# Patient Record
Sex: Male | Born: 1935
Health system: Southern US, Community
[De-identification: ages and names within clinical notes are randomized; demographics above are authoritative.]

## PROBLEM LIST (undated history)

## (undated) DIAGNOSIS — B029 Zoster without complications: Secondary | ICD-10-CM

## (undated) DIAGNOSIS — I714 Abdominal aortic aneurysm, without rupture, unspecified: Secondary | ICD-10-CM

## (undated) DIAGNOSIS — I10 Essential (primary) hypertension: Secondary | ICD-10-CM

## (undated) DIAGNOSIS — S46219A Strain of muscle, fascia and tendon of other parts of biceps, unspecified arm, initial encounter: Secondary | ICD-10-CM

## (undated) DIAGNOSIS — M7020 Olecranon bursitis, unspecified elbow: Secondary | ICD-10-CM

## (undated) DIAGNOSIS — M1710 Unilateral primary osteoarthritis, unspecified knee: Secondary | ICD-10-CM

## (undated) DIAGNOSIS — H01119 Allergic dermatitis of unspecified eye, unspecified eyelid: Secondary | ICD-10-CM

## (undated) DIAGNOSIS — M179 Osteoarthritis of knee, unspecified: Secondary | ICD-10-CM

## (undated) DIAGNOSIS — H01009 Unspecified blepharitis unspecified eye, unspecified eyelid: Secondary | ICD-10-CM

## (undated) DIAGNOSIS — G47419 Narcolepsy without cataplexy: Secondary | ICD-10-CM

## (undated) DIAGNOSIS — C801 Malignant (primary) neoplasm, unspecified: Secondary | ICD-10-CM

## (undated) DIAGNOSIS — N200 Calculus of kidney: Secondary | ICD-10-CM

## (undated) DIAGNOSIS — E785 Hyperlipidemia, unspecified: Secondary | ICD-10-CM

## (undated) DIAGNOSIS — H269 Unspecified cataract: Secondary | ICD-10-CM

## (undated) DIAGNOSIS — K602 Anal fissure, unspecified: Secondary | ICD-10-CM

## (undated) DIAGNOSIS — N210 Calculus in bladder: Secondary | ICD-10-CM

## (undated) DIAGNOSIS — E119 Type 2 diabetes mellitus without complications: Secondary | ICD-10-CM

## (undated) HISTORY — DX: Olecranon bursitis, unspecified elbow: M70.20

## (undated) HISTORY — DX: Abdominal aortic aneurysm, without rupture: I71.4

## (undated) HISTORY — DX: Calculus of kidney: N20.0

## (undated) HISTORY — DX: Unspecified blepharitis unspecified eye, unspecified eyelid: H01.009

## (undated) HISTORY — DX: Abdominal aortic aneurysm, without rupture, unspecified: I71.40

## (undated) HISTORY — DX: Anal fissure, unspecified: K60.2

## (undated) HISTORY — DX: Unspecified cataract: H26.9

## (undated) HISTORY — DX: Hyperlipidemia, unspecified: E78.5

## (undated) HISTORY — DX: Essential (primary) hypertension: I10

## (undated) HISTORY — DX: Calculus in bladder: N21.0

## (undated) HISTORY — DX: Osteoarthritis of knee, unspecified: M17.9

## (undated) HISTORY — PX: HERNIA REPAIR: SHX51

## (undated) HISTORY — DX: Allergic dermatitis of unspecified eye, unspecified eyelid: H01.119

## (undated) HISTORY — PX: FACIAL COSMETIC SURGERY: SHX629

## (undated) HISTORY — DX: Type 2 diabetes mellitus without complications: E11.9

## (undated) HISTORY — DX: Unilateral primary osteoarthritis, unspecified knee: M17.10

## (undated) HISTORY — DX: Strain of muscle, fascia and tendon of other parts of biceps, unspecified arm, initial encounter: S46.219A

## (undated) HISTORY — DX: Zoster without complications: B02.9

## (undated) HISTORY — DX: Malignant (primary) neoplasm, unspecified: C80.1

## (undated) HISTORY — DX: Narcolepsy without cataplexy: G47.419

---

## 1997-12-25 ENCOUNTER — Inpatient Hospital Stay (HOSPITAL_COMMUNITY): Admission: RE | Admit: 1997-12-25 | Discharge: 1997-12-26 | Payer: Self-pay | Admitting: Urology

## 2000-05-05 ENCOUNTER — Other Ambulatory Visit: Admission: RE | Admit: 2000-05-05 | Discharge: 2000-05-05 | Payer: Self-pay | Admitting: Urology

## 2000-05-05 ENCOUNTER — Encounter (INDEPENDENT_AMBULATORY_CARE_PROVIDER_SITE_OTHER): Payer: Self-pay | Admitting: Specialist

## 2000-06-03 ENCOUNTER — Encounter: Admission: RE | Admit: 2000-06-03 | Discharge: 2000-09-01 | Payer: Self-pay | Admitting: Radiation Oncology

## 2007-08-19 ENCOUNTER — Encounter: Admission: RE | Admit: 2007-08-19 | Discharge: 2007-08-19 | Payer: Self-pay | Admitting: Orthopedic Surgery

## 2007-08-26 ENCOUNTER — Encounter: Admission: RE | Admit: 2007-08-26 | Discharge: 2007-09-15 | Payer: Self-pay | Admitting: Orthopedic Surgery

## 2007-08-31 ENCOUNTER — Ambulatory Visit: Payer: Self-pay | Admitting: Vascular Surgery

## 2007-09-28 ENCOUNTER — Ambulatory Visit: Payer: Self-pay | Admitting: Vascular Surgery

## 2008-09-19 ENCOUNTER — Ambulatory Visit: Payer: Self-pay | Admitting: Vascular Surgery

## 2009-06-01 DIAGNOSIS — B029 Zoster without complications: Secondary | ICD-10-CM

## 2009-06-01 HISTORY — DX: Zoster without complications: B02.9

## 2009-09-18 ENCOUNTER — Ambulatory Visit: Payer: Self-pay | Admitting: Vascular Surgery

## 2010-04-18 ENCOUNTER — Encounter: Admission: RE | Admit: 2010-04-18 | Discharge: 2010-04-18 | Payer: Self-pay | Admitting: Internal Medicine

## 2010-09-04 ENCOUNTER — Ambulatory Visit: Payer: Self-pay | Admitting: Vascular Surgery

## 2010-10-14 NOTE — Assessment & Plan Note (Signed)
OFFICE VISIT   Omar Gomez, Omar Gomez  DOB:  April 06, 1925                                       09/19/2008  [id number]#:[id number]   Omar Gomez returns for followup today for bilateral lower extremity  arterial occlusive disease.  He was last seen 1 year ago.  He states  that currently he develops pain in his knees in the medial aspect of his  leg around the knee at approximately 1-2 blocks.  He denies any calf  pain.  He has no problems with pain while riding a bike.  His pain only  occurs with walking.  He has minimal pain when at rest.  He denies any  pain in his feet.  He states that some days the pain is worse than  others and some days it is better than others.   His primary atherosclerotic risk factors include age, elevated  cholesterol, borderline diabetes and hypertension.   MEDICATIONS:  Atenolol 50 mg once a day, Zetia 10 mg once a day,  amlodipine 5 mg once a day, ibuprofen 800 mg once a day, Lipitor 80 mg  once a day, hydrochlorothiazide 25 mg once a day, aspirin 81 mg once a  day.   PHYSICAL EXAMINATION:  VITAL SIGNS:  Blood pressure 148/76 in the right  arm, pulse 52 and regular.  HEENT:  Unremarkable.  NECK:  2+ carotid pulses without bruit.  CHEST:  Clear to auscultation.  CARDIAC:  Regular rate and rhythm without murmur.  ABDOMEN:  Soft, nontender, nondistended with no masses.  EXTREMITIES:  He has 2+ femoral pulses bilaterally.  He has a 1+ right  dorsalis pedis pulse and absent left dorsalis pedis pulse.  He has  absent posterior tibial pulses bilaterally.  Feet are pink, warm and  well-perfused.  On standing, he has bilateral valgus deformity of both  knees.   He had bilateral ABIs performed today which were 0.92 on the right, 0.73  on the left.  These are essentially unchanged from 1 year ago.   I discussed with Omar Gomez today that he is not at risk of limb loss  and has reasonable lower extremity circulation bilaterally.  Most of his  pain again sounds more like degenerative disease and pain related to his  knees rather than claudication.  I did discuss with him the possibility  of an intervention for his pain.  However, he states that this is more  of a nuisance for right now and he is not really interested in any  intervention at this point.  I believe the best option for him is  continued walking, hopefully 30 minutes a day.  He will continue his  aspirin.  He will also continue risk factor modification with treatment  of his hypertension and elevated cholesterol.  He will follow up  with  repeat ABIs in 1 year.   Janetta Hora. Fields, MD  Electronically Signed   CEF/MEDQ  D:  09/19/2008  T:  09/20/2008  Job:  [id number]   cc:   Marlowe Kays, M.D.  Theressa Millard, M.D.

## 2010-10-14 NOTE — Assessment & Plan Note (Signed)
OFFICE VISIT   MAXX, PHAM  DOB:  07-11-1924                                       09/28/2007  [id number]#:[id number]   The patient is an 77 year old male referred for bilateral leg pain.  He  states that he has been having trouble with his knees giving way  secondary to arthritis.  He does not describe classic claudication  symptoms.  He has no calf pain.  He has no rest pain.  He has no  numbness or tingling in his feet.  He denies any tobacco use recently.  He quit smoking 25 years ago.  His atherosclerotic risk factors include  hypertension, elevated cholesterol, and borderline diabetes.  He has no  history of coronary artery disease.  He currently is limited by his  knees giving way during walking.  However, he is able to walk for  approximately 15 minutes a day and he sometimes rides a stationary bike   His past surgical history is remarkable for right inguinal hernia  repairs.  His past medical history, he also had prostate cancer treated  with radiation.  He also has a history of an anal fissure secondary to  this, which has some occasional bleeding.   Medications include atenolol, Lipitor, ibuprofen, Zetia, and a baby  aspirin every other day.  He has no known drug allergies.   Family history is unremarkable.   SOCIAL HISTORY:  He is married.  He has two children.  Smoking history  is as above.  He does not consume alcohol regularly.   On review of systems, he has had no recent weight loss or gain.  He is 5  feet 7, 190 pounds.  He denies any history of chest tightness or shortness of breath.  He has no history of asthma, wheezing, or peptic ulcer disease.  He has some urinary frequency.  He denies history TIA, stroke, syncopal episodes, or seizures.  He has multiple joint arthritis pain and muscle pain.  ENT:  He has decreased visual acuity and wears glasses.  He also has  some decreased hearing acuity.   On physical exam, blood pressure  is 128/69 in the left arm, heart rate  is 62 and regular.  HEENT is unremarkable.  He has no carotid bruits.  Chest is clear to auscultation.  Cardiac exam is regular rate rhythm  without murmur.  Abdomen is soft, nontender, nondistended.  No masses.  Extremities:  He has 2+ radial pulses bilaterally.  He has 2+ femoral  and popliteal pulses bilaterally.  He has absent pedal pulses in the  left foot.  He has a 2+ dorsalis pedis pulse and a 1+ posterior tibial  pulse in the right foot.  Both feet are pink, warm, and adequately  perfused and there are no ulcerations on the feet.   He had bilateral ABIs performed at [facility] on March 20th.  These were 0.96 on the right and 0.70 on the left.  He did have some  evidence of tibial occlusive disease.   In summary, the patient does have some evidence of tibial occlusive  disease bilaterally.  However, he is not experiencing claudication  symptoms.  He currently does not have perfusion that is significant  enough to be considered limb-threatening.  I would also not consider his  symptoms to be disabling.  His symptoms primarily  seem to be related to  his orthopedic or to his joint problems.  I believe he could be managed  conservatively.  This would include continued risk factor modification  and 30 minutes of walking daily.  He will follow up with me for repeat  ABIs in one year's time or sooner if he has development of classic  claudication symptoms.   Janetta Hora. Fields, MD  Electronically Signed   CEF/MEDQ  D:  09/28/2007  T:  09/29/2007  Job:  [id number]   cc:   Marlowe Kays, M.D.  Theressa Millard, M.D.

## 2013-06-19 ENCOUNTER — Other Ambulatory Visit: Payer: Self-pay | Admitting: Internal Medicine

## 2013-06-19 DIAGNOSIS — I714 Abdominal aortic aneurysm, without rupture, unspecified: Secondary | ICD-10-CM

## 2013-09-20 ENCOUNTER — Ambulatory Visit
Admission: RE | Admit: 2013-09-20 | Discharge: 2013-09-20 | Disposition: A | Discharge status: Home / Self Care | Payer: Medicare Other | Source: Ambulatory Visit | Attending: Internal Medicine | Admitting: Internal Medicine

## 2013-09-20 DIAGNOSIS — I714 Abdominal aortic aneurysm, without rupture, unspecified: Secondary | ICD-10-CM

## 2013-09-27 ENCOUNTER — Encounter: Payer: Self-pay | Admitting: Vascular Surgery

## 2013-10-11 ENCOUNTER — Encounter: Payer: Self-pay | Admitting: Vascular Surgery

## 2013-10-12 ENCOUNTER — Encounter: Payer: Self-pay | Admitting: Vascular Surgery

## 2013-10-12 ENCOUNTER — Ambulatory Visit (INDEPENDENT_AMBULATORY_CARE_PROVIDER_SITE_OTHER): Payer: Medicare Other | Admitting: Vascular Surgery

## 2013-10-12 VITALS — BP 140/72 | HR 62 | Resp 18 | Ht 65.0 in | Wt 197.7 lb

## 2013-10-12 DIAGNOSIS — I714 Abdominal aortic aneurysm, without rupture, unspecified: Secondary | ICD-10-CM

## 2013-10-12 DIAGNOSIS — Z01810 Encounter for preprocedural cardiovascular examination: Secondary | ICD-10-CM

## 2013-10-12 NOTE — Progress Notes (Signed)
[facility] [place] HISTORY AND PHYSICAL   CC:  Enlarging AAA  Omar Gomez,*  HPI: This is a 80 y.o. male who has a known AAA that was measured 4.43 cm a year ago.  He had a repeat aortic duplex last month that measured 4.9 cm.  He states that he is not having any abdominal pain.  He states that he has an occasional strain pain in his left groin, but contributes this to strain.  He does have arthritic back pain that he has had for a long time.  He states that he does have pain in his legs and walks for a short distance to the mailbox from the front porch.  He was seen by Dr. Oneida Alar in 2010 and had ABI's at that time that were 0.92 on the right and 0.73 on the left.  He states that he is told he is borderline diabetic and is not on any medications for this.  He is on a statin for his cholesterol.  He is on a beta blocker for his HTN.  He has remote tobacco hx and quit in the 1960's.  He denies any hx of CVA or symptoms and denies MI or any hx of chest pain or SOB.   Past Medical History  Diagnosis Date  . AAA (abdominal aortic aneurysm)   . Diabetes mellitus without complication   . Hypertension   . Hyperlipidemia   . Cancer     PROSTATE CANCER TREATED WITH RADIATION  . Shingles 2011  . Anal fissure   . Kidney stones    Past Surgical History  Procedure Laterality Date  . Hernia repair      1980    No Known Allergies  Current Outpatient Prescriptions  Medication Sig Dispense Refill  . acetaminophen (TYLENOL) 500 MG tablet Take 500 mg by mouth every 6 (six) hours as needed. Take 2 tablets every 6 hours.      Marland Kitchen amLODipine (NORVASC) 5 MG tablet Take 5 mg by mouth daily.      Marland Kitchen aspirin 81 MG tablet Take 81 mg by mouth daily.      Marland Kitchen atenolol (TENORMIN) 50 MG tablet Take 50 mg by mouth daily. Takes 1/2 tablet daily.      Marland Kitchen atorvastatin (LIPITOR) 80 MG tablet Take 80 mg by mouth daily.      Marland Kitchen diltiazem 2 % GEL Apply 1 application topically 3 (three)  times daily.      Marland Kitchen ezetimibe (ZETIA) 10 MG tablet Take 10 mg by mouth daily.      . hydrochlorothiazide (HYDRODIURIL) 25 MG tablet Take 25 mg by mouth daily.       No current facility-administered medications for this visit.    History reviewed. No pertinent family history.  History   Social History  . Marital Status: Married    Spouse Name: N/A    Number of Children: N/A  . Years of Education: N/A   Occupational History  . Not on file.   Social History Main Topics  . Smoking status: Former Smoker    Quit date: 06/02/1963  . Smokeless tobacco: Not on file  . Alcohol Use: No  . Drug Use: No  . Sexual Activity: Not on file   Other Topics Concern  . Not on file   Social History Narrative  . No narrative on file     ROS: [x]  Positive   [ ]  Negative   [ ]  All sytems reviewed and are  negative  Cardiovascular: []  chest pain/pressure []  palpitations []  SOB lying flat []  DOE [x]  pain in legs while walking []  pain in feet when lying flat []  hx of DVT []  hx of phlebitis [x]  swelling in knees-slightly []  varicose veins  Pulmonary: []  productive cough []  asthma []  wheezing  Neurologic: []  weakness in []  arms []  legs []  numbness in []  arms []  legs [] difficulty speaking or slurred speech []  temporary loss of vision in one eye []  dizziness  Hematologic: []  bleeding problems []  problems with blood clotting easily  GI []  vomiting blood []  blood in stool  GU: []  burning with urination []  blood in urine  Psychiatric: []  hx of major depression  Integumentary: []  rashes []  ulcers  Constitutional: []  fever []  chills   PHYSICAL EXAMINATION:  Filed Vitals:   10/12/13 1313  BP: 140/72  Pulse: 62  Resp: 18   Body mass index is 32.9 kg/(m^2).  General:  WDWN in NAD Gait: Not observed HENT: WNL, normocephalic Eyes: Pupils equal Pulmonary: normal non-labored breathing , without Rales, rhonchi,  wheezing Cardiac: RRR, without  Murmurs, rubs or  gallops; without carotid bruits Abdomen: soft, NT, no masses Skin: without rashes, without ulcers  Vascular Exam/Pulses:  Right Left  Radial 2+ (normal) 2+ (normal)  Ulnar 2+ (normal) Non palpable  Femoral 2+ (normal) 2+ (normal)  Popliteal Unable to palpate due to knee brace Non palpable  DP trace trace  PT Non palpable Non palpable   Extremities: without ischemic changes, without Gangrene , without cellulitis; without open wounds;  Musculoskeletal: no muscle wasting or atrophy  Neurologic: A&O X 3; Appropriate Affect ; SENSATION: normal; MOTOR FUNCTION:  moving all extremities equally. Speech is fluent/normal   Non-Invasive Vascular Imaging:   Aortic duplex from outside facility 09/20/13 measuring 4.43 10/07/12   Duplex 09/20/13:  now measuring 4.9 x 4.8 cm,  compared to prior measurements of 4.2 x 4.2 cm.   Pt meds includes: Statin:  yes Beta Blocker:  yes Aspirin:  no ACEI:  no ARB:  no Other Antiplatelet/Anticoagulant:  no   ASSESSMENT: 80 y.o. male with known AAA that has enlarged over the past year from 4.3 cm 10/07/12 to 4.9 cm 09/20/13.  He is asymptomatic.  PLAN: -will schedule pt for CTA of abdomen and pelvis to better evaluate the size of the AAA.  Will see him back after the CTA to discuss results.   Leontine Locket, PA-C [facility] [phone]  Clinic MD:  Pt seen and examined in conjunction with Dr. Oneida Alar  History and exam details as above. Patient has a benign abdominal exam. He has not had any new abdominal pain. He has chronic back pain. He has 2+ femoral pulses bilaterally. Ultrasound suggests some growth of the aneurysm over the last year. We will obtain a CT angiogram the abdomen and pelvis to see if the ultrasound is correct and increasing size. If the aneurysm is 5.5 cm or greater or rapidly expanding we will consider repair. The patient will followup with me after his CT scan.  Ruta Hinds, MD [facility]  [place] Office: [phone] Pager: [phone]

## 2013-10-16 ENCOUNTER — Other Ambulatory Visit: Payer: Self-pay | Admitting: Vascular Surgery

## 2013-10-16 LAB — CREATININE, SERUM: CREATININE: 1.06 mg/dL (ref 0.50–1.35)

## 2013-10-16 LAB — BUN: BUN: 19 mg/dL (ref 6–23)

## 2013-10-18 ENCOUNTER — Encounter: Payer: Self-pay | Admitting: Family

## 2013-10-19 ENCOUNTER — Encounter: Payer: Self-pay | Admitting: Vascular Surgery

## 2013-10-19 ENCOUNTER — Ambulatory Visit
Admission: RE | Admit: 2013-10-19 | Discharge: 2013-10-19 | Disposition: A | Discharge status: Home / Self Care | Payer: Medicare Other | Source: Ambulatory Visit | Attending: Vascular Surgery | Admitting: Vascular Surgery

## 2013-10-19 ENCOUNTER — Ambulatory Visit (INDEPENDENT_AMBULATORY_CARE_PROVIDER_SITE_OTHER): Payer: Medicare Other | Admitting: Vascular Surgery

## 2013-10-19 VITALS — BP 124/72 | HR 66 | Resp 18 | Ht 65.0 in | Wt 194.4 lb

## 2013-10-19 DIAGNOSIS — I714 Abdominal aortic aneurysm, without rupture, unspecified: Secondary | ICD-10-CM

## 2013-10-19 DIAGNOSIS — Z01810 Encounter for preprocedural cardiovascular examination: Secondary | ICD-10-CM

## 2013-10-19 MED ORDER — IOHEXOL 350 MG/ML SOLN
80.0000 mL | Freq: Once | INTRAVENOUS | Status: AC | PRN
  Administered 2013-10-19: 80 mL via INTRAVENOUS

## 2013-10-19 NOTE — Addendum Note (Signed)
Addended by: MCCHESNEY, MARILYN K on: 10/19/2013 04:12 PM   Modules accepted: Orders  

## 2013-10-19 NOTE — Progress Notes (Signed)
Patient is an 56 her old male who returns for followup today. He recently had a size increase of his abdominal aortic aneurysm on ultrasound. He was sent for CT scan for further definition of the aneurysm. He returns for followup today. Ultrasound in 2011 showed the aneurysm was 4.2 cm in diameter. Recent ultrasound had suggested 4.8 cm in diameter. He continues to deny any abdominal pain. He has chronic back pain.  Review of systems: He has slightly unstable gait. He has shortness of breath with severe exertion.  Physical exam:  Filed Vitals:   10/19/13 1237  BP: 124/72  Pulse: 66  Resp: 18  Height: 5\' 5"  (1.651 m)  Weight: 194 lb 6.4 oz (88.179 kg)    Abdomen: Obese soft nontender nondistended  Data: CT angiogram of the abdomen and pelvis is reviewed today. The aneurysm measures 4.4 cm in diameter. I was able to take several measurements an oblique which would put the aneurysm is large as 5 cm but certainly no larger than this. The left common iliac artery was ectatic at 2 cm.  Assessment: Slowly growing abdominal aortic aneurysm asymptomatic less than 5 cm diameter. I discussed with the patient and his wife today that her risk of rupture of aneurysm less than 5-1/2 cm in diameter is less than 0.5% per year  Plan: The patient will have a repeat ultrasound in 6 months time and further review. He will return to the emergency room if he develops severe abdominal or back pain.  Ruta Hinds, MD [facility] [place] Office: [phone] Pager: [phone]   Extremities: 2+ femoral pulses bilaterally

## 2014-03-24 ENCOUNTER — Encounter: Payer: Self-pay | Admitting: *Deleted

## 2014-04-18 ENCOUNTER — Encounter: Payer: Self-pay | Admitting: Vascular Surgery

## 2014-04-19 ENCOUNTER — Ambulatory Visit (HOSPITAL_COMMUNITY)
Admission: RE | Admit: 2014-04-19 | Discharge: 2014-04-19 | Disposition: A | Discharge status: Home / Self Care | Payer: Medicare Other | Source: Ambulatory Visit | Attending: Vascular Surgery | Admitting: Vascular Surgery

## 2014-04-19 ENCOUNTER — Encounter: Payer: Self-pay | Admitting: Vascular Surgery

## 2014-04-19 ENCOUNTER — Ambulatory Visit (INDEPENDENT_AMBULATORY_CARE_PROVIDER_SITE_OTHER): Payer: Medicare Other | Admitting: Vascular Surgery

## 2014-04-19 VITALS — BP 124/59 | HR 50 | Temp 97.7°F | Resp 14 | Ht 65.0 in | Wt 198.0 lb

## 2014-04-19 DIAGNOSIS — I714 Abdominal aortic aneurysm, without rupture, unspecified: Secondary | ICD-10-CM

## 2014-04-19 NOTE — Progress Notes (Signed)
Patient is an 80 year old male who returns for followup today for abdominal aortic aneurysm.  Ultrasound in 2011 showed the aneurysm was 4.2 cm in diameter. He continues to deny any abdominal pain. He has chronic back pain.  Review of systems: He has slightly unstable gait. He has shortness of breath with severe exertion.  Physical exam:    Filed Vitals:   04/19/14 0944  BP: 124/59  Pulse: 50  Temp: 97.7 F (36.5 C)  TempSrc: Oral  Resp: 14  Height: 5\' 5"  (1.651 m)  Weight: 198 lb (89.812 kg)  SpO2: 99%    Abdomen: Obese soft nontender nondistended Extremities: 2+ femoral pulses bilaterally, absent popliteal and pedal pulses  Data: Ultrasound of the abdominal aorta was performed today. The aneurysm measures 4.4 cm in diameter.  Assessment: Slowly growing abdominal aortic aneurysm asymptomatic less than 5 cm diameter. I discussed with the patient and his wife today that her risk of rupture of aneurysm less than 5-1/2 cm in diameter is less than 0.5% per year  Plan: The patient will have a repeat ultrasound in 6 months time and further review. He will return to the emergency room if he develops severe abdominal or back pain.  Ruta Hinds, MD [facility] [place] Office: [phone] Pager: [phone]

## 2014-04-19 NOTE — Addendum Note (Signed)
Addended by: Mena Goes on: 04/19/2014 04:53 PM   Modules accepted: Orders

## 2014-10-16 ENCOUNTER — Encounter: Payer: Self-pay | Admitting: Family

## 2014-10-18 ENCOUNTER — Encounter: Payer: Self-pay | Admitting: Family

## 2014-10-18 ENCOUNTER — Ambulatory Visit (INDEPENDENT_AMBULATORY_CARE_PROVIDER_SITE_OTHER): Payer: Medicare Other | Admitting: Family

## 2014-10-18 ENCOUNTER — Ambulatory Visit (HOSPITAL_COMMUNITY)
Admission: RE | Admit: 2014-10-18 | Discharge: 2014-10-18 | Disposition: A | Discharge status: Home / Self Care | Payer: Medicare Other | Source: Ambulatory Visit | Attending: Vascular Surgery | Admitting: Vascular Surgery

## 2014-10-18 VITALS — BP 139/70 | HR 50 | Resp 14 | Ht 66.0 in | Wt 198.0 lb

## 2014-10-18 DIAGNOSIS — I7789 Other specified disorders of arteries and arterioles: Secondary | ICD-10-CM | POA: Diagnosis not present

## 2014-10-18 DIAGNOSIS — I714 Abdominal aortic aneurysm, without rupture, unspecified: Secondary | ICD-10-CM

## 2014-10-18 DIAGNOSIS — E669 Obesity, unspecified: Secondary | ICD-10-CM | POA: Diagnosis not present

## 2014-10-18 NOTE — Addendum Note (Signed)
Addended by: Dorthula Rue L on: 10/18/2014 11:33 AM   Modules accepted: Orders

## 2014-10-18 NOTE — Progress Notes (Signed)
[facility] [place]  Established Abdominal Aortic Aneurysm  History of Present Illness  Omar Gomez is a 81 y.o. ([birth year]) male patient of Dr. Oneida Alar who returns for followup today for abdominal aortic aneurysm. Ultrasound in 2011 showed the aneurysm was 4.2 cm in diameter. He continues to deny any abdominal pain. He has chronic back pain.  The patient does not seem to have claudication symptoms as he cannot walk much due to significant bilateral knee pain. The patient denies history of stroke or TIA symptoms.  Pt Diabetic: Yes Pt smoker: former smoker, quit in the 1960's  Past Medical History  Diagnosis Date  . AAA (abdominal aortic aneurysm)   . Diabetes mellitus without complication   . Hypertension   . Hyperlipidemia   . Cancer     PROSTATE CANCER TREATED WITH RADIATION  . Shingles 2011  . Anal fissure   . Kidney stones   . Narcolepsy   . OA (osteoarthritis) of knee   . Bladder stone   . Biceps tendon rupture   . Cataract   . Olecranon bursitis   . Dermatitis contact, eyelid   . Blepharitis    Past Surgical History  Procedure Laterality Date  . Hernia repair      1980  . Facial cosmetic surgery      Lesion removed to rule out melanoma   Social History History   Social History  . Marital Status: Married    Spouse Name: N/A  . Number of Children: N/A  . Years of Education: N/A   Occupational History  . Not on file.   Social History Main Topics  . Smoking status: Former Smoker    Quit date: 06/02/1963  . Smokeless tobacco: Not on file  . Alcohol Use: No  . Drug Use: No  . Sexual Activity: Not on file   Other Topics Concern  . Not on file   Social History Narrative   Family History Family History  Problem Relation Age of Onset  . Alcohol abuse Brother   . Heart disease Brother     Current Outpatient Prescriptions on File Prior to Visit  Medication Sig Dispense Refill  . acetaminophen (TYLENOL) 500 MG tablet Take  500 mg by mouth every 6 (six) hours as needed. Take 2 tablets every 6 hours.    Marland Kitchen amLODipine (NORVASC) 5 MG tablet Take 5 mg by mouth daily.    Marland Kitchen aspirin 81 MG tablet Take 81 mg by mouth as needed. Pt takes one tablet every two days.    Marland Kitchen atenolol (TENORMIN) 50 MG tablet Take 50 mg by mouth daily. Takes 1/2 tablet daily.    Marland Kitchen atorvastatin (LIPITOR) 80 MG tablet Take 40 mg by mouth daily.     Marland Kitchen diltiazem 2 % GEL Apply 1 application topically daily.     Marland Kitchen ezetimibe (ZETIA) 10 MG tablet Take 10 mg by mouth daily.    . hydrochlorothiazide (HYDRODIURIL) 25 MG tablet Take 25 mg by mouth daily.     No current facility-administered medications on file prior to visit.   No Known Allergies  ROS: See HPI for pertinent positives and negatives.  Physical Examination  Filed Vitals:   10/18/14 0938  BP: 139/70  Pulse: 50  Resp: 14  Height: 5\' 6"  (1.676 m)  Weight: 198 lb (89.812 kg)  SpO2: 97%   Body mass index is 31.97 kg/(m^2).  General: A&O x 3, WD, obese male.  Pulmonary:Respirations are not labored.  Cardiac: RRR, Nl  S1, S2, no appreciable murmur.   Carotid Bruits Right Left   Negative Negative   Aorta is not palpable Radial pulses are 2+ palpable and =                          VASCULAR EXAM:                                                                                                         LE Pulses Right Left       FEMORAL  not palpable (obese)  not palpable (obese)        POPLITEAL  not palpable   not palpable       POSTERIOR TIBIAL  not palpable   not palpable        DORSALIS PEDIS      ANTERIOR TIBIAL faintly palpable  faintly palpable      Gastrointestinal: soft, NTND, -G/R, - HSM, - masses palpated, - CVAT B.  Musculoskeletal: M/S 4/5 throughout, Extremities without ischemic changes.  Neurologic: CN 2-12 intact except some hearing loss, Pain and light touch intact in extremities are intact, Motor exam as listed above.   Non-Invasive Vascular  Imaging  AAA Duplex (10/18/2014) ABDOMINAL AORTA DUPLEX EVALUATION    INDICATION: Follow-up abdominal aortic aneurysm     PREVIOUS INTERVENTION(S):     DUPLEX EXAM:     LOCATION DIAMETER AP (cm) DIAMETER TRANSVERSE (cm) VELOCITIES (cm/sec)  Aorta Proximal 2.33 2.33 46  Aorta Mid 4.46 4.46 38  Aorta Distal 2.19 2.17 41  Right Common Iliac Artery 1.53  118  Left Common Iliac Artery 1.93  109    Previous max aortic diameter:  4.4cm Date: 04/19/2014  ADDITIONAL FINDINGS:     IMPRESSION: 1. Stable abdominal aortic aneurysm measuring 4.46cm on today's exam. 2. Ectasia is observed in the bilateral common iliac artery.    Compared to the previous exam:       Medical Decision Making  The patient is a 81 y.o. male who presents with asymptomatic AAA with no increase in size.   Based on this patient's exam and diagnostic studies, the patient will follow up in 6 months  with the following studies: AAA Duplex.  Consideration for repair of AAA would be made when the size is 5.5 cm, growth > 1 cm/yr, and symptomatic status.  Dr. Oneida Alar previously discussed with the patient and his wife that his risk of rupture of aneurysm less than 5-1/2 cm in diameter is less than 0.5% per year.  I emphasized the importance of maximal medical management including strict control of blood pressure, blood glucose, and lipid levels, antiplatelet agents, obtaining regular exercise, and continued cessation of smoking.   The patient was given information about AAA including signs, symptoms, treatment, and how to minimize the risk of enlargement and rupture of aneurysms.    The patient was advised to call [phone] should the patient experience sudden onset abdominal or back pain.   Thank you for allowing [place] to participate in this patient's care.  Vinnie Level  Yulia Ulrich, RN, MSN, FNP-C [facility] [place] Office: [phone]  Clinic Physician: Oneida Alar  10/18/2014, 9:46 AM

## 2014-10-18 NOTE — Patient Instructions (Signed)
Abdominal Aortic Aneurysm An aneurysm is a weakened or damaged part of an artery wall that bulges from the normal force of blood pumping through the body. An abdominal aortic aneurysm is an aneurysm that occurs in the lower part of the aorta, the main artery of the body.  The major concern with an abdominal aortic aneurysm is that it can enlarge and burst (rupture) or blood can flow between the layers of the wall of the aorta through a tear (aorticdissection). Both of these conditions can cause bleeding inside the body and can be life threatening unless diagnosed and treated promptly. CAUSES  The exact cause of an abdominal aortic aneurysm is unknown. Some contributing factors are:   A hardening of the arteries caused by the buildup of fat and other substances in the lining of a blood vessel (arteriosclerosis).  Inflammation of the walls of an artery (arteritis).   Connective tissue diseases, such as Marfan syndrome.   Abdominal trauma.   An infection, such as syphilis or staphylococcus, in the wall of the aorta (infectious aortitis) caused by bacteria. RISK FACTORS  Risk factors that contribute to an abdominal aortic aneurysm may include:  Age older than 60 years.   High blood pressure (hypertension).  Male gender.  Ethnicity (white race).  Obesity.  Family history of aneurysm (first degree relatives only).  Tobacco use. PREVENTION  The following healthy lifestyle habits may help decrease your risk of abdominal aortic aneurysm:  Quitting smoking. Smoking can raise your blood pressure and cause arteriosclerosis.  Limiting or avoiding alcohol.  Keeping your blood pressure, blood sugar level, and cholesterol levels within normal limits.  Decreasing your salt intake. In somepeople, too much salt can raise blood pressure and increase your risk of abdominal aortic aneurysm.  Eating a diet low in saturated fats and cholesterol.  Increasing your fiber intake by including  whole grains, vegetables, and fruits in your diet. Eating these foods may help lower blood pressure.  Maintaining a healthy weight.  Staying physically active and exercising regularly. SYMPTOMS  The symptoms of abdominal aortic aneurysm may vary depending on the size and rate of growth of the aneurysm.Most grow slowly and do not have any symptoms. When symptoms do occur, they may include:  Pain (abdomen, side, lower back, or groin). The pain may vary in intensity. A sudden onset of severe pain may indicate that the aneurysm has ruptured.  Feeling full after eating only small amounts of food.  Nausea or vomiting or both.  Feeling a pulsating lump in the abdomen.  Feeling faint or passing out. DIAGNOSIS  Since most unruptured abdominal aortic aneurysms have no symptoms, they are often discovered during diagnostic exams for other conditions. An aneurysm may be found during the following procedures:  Ultrasonography (A one-time screening for abdominal aortic aneurysm by ultrasonography is also recommended for all men aged 65-75 years who have ever smoked).  X-ray exams.  A computed tomography (CT).  Magnetic resonance imaging (MRI).  Angiography or arteriography. TREATMENT  Treatment of an abdominal aortic aneurysm depends on the size of your aneurysm, your age, and risk factors for rupture. Medication to control blood pressure and pain may be used to manage aneurysms smaller than 6 cm. Regular monitoring for enlargement may be recommended by your caregiver if:  The aneurysm is 3-4 cm in size (an annual ultrasonography may be recommended).  The aneurysm is 4-4.5 cm in size (an ultrasonography every 6 months may be recommended).  The aneurysm is larger than 4.5 cm in   size (your caregiver may ask that you be examined by a vascular surgeon). If your aneurysm is larger than 6 cm, surgical repair may be recommended. There are two main methods for repair of an aneurysm:   Endovascular  repair (a minimally invasive surgery). This is done most often.  Open repair. This method is used if an endovascular repair is not possible. Document Released: 02/25/2005 Document Revised: 09/12/2012 Document Reviewed: 06/17/2012 Exit[employer] Patient Information 2015 [employer]. This information is not intended to replace advice given to you by your health care provider. Make sure you discuss any questions you have with your health care provider.  

## 2015-04-30 ENCOUNTER — Encounter: Payer: Self-pay | Admitting: Family

## 2015-05-02 ENCOUNTER — Ambulatory Visit (INDEPENDENT_AMBULATORY_CARE_PROVIDER_SITE_OTHER): Payer: Medicare Other | Admitting: Family

## 2015-05-02 ENCOUNTER — Encounter: Payer: Self-pay | Admitting: Family

## 2015-05-02 ENCOUNTER — Ambulatory Visit (HOSPITAL_COMMUNITY)
Admission: RE | Admit: 2015-05-02 | Discharge: 2015-05-02 | Disposition: A | Discharge status: Home / Self Care | Payer: Medicare Other | Source: Ambulatory Visit | Attending: Family | Admitting: Family

## 2015-05-02 VITALS — BP 119/70 | HR 53 | Temp 97.7°F | Resp 14 | Ht 65.5 in | Wt 202.0 lb

## 2015-05-02 DIAGNOSIS — I10 Essential (primary) hypertension: Secondary | ICD-10-CM | POA: Insufficient documentation

## 2015-05-02 DIAGNOSIS — E669 Obesity, unspecified: Secondary | ICD-10-CM | POA: Diagnosis not present

## 2015-05-02 DIAGNOSIS — E785 Hyperlipidemia, unspecified: Secondary | ICD-10-CM | POA: Insufficient documentation

## 2015-05-02 DIAGNOSIS — I714 Abdominal aortic aneurysm, without rupture, unspecified: Secondary | ICD-10-CM

## 2015-05-02 DIAGNOSIS — E119 Type 2 diabetes mellitus without complications: Secondary | ICD-10-CM | POA: Insufficient documentation

## 2015-05-02 NOTE — Patient Instructions (Signed)
Abdominal Aortic Aneurysm An aneurysm is a weakened or damaged part of an artery wall that bulges from the normal force of blood pumping through the body. An abdominal aortic aneurysm is an aneurysm that occurs in the lower part of the aorta, the main artery of the body.  The major concern with an abdominal aortic aneurysm is that it can enlarge and burst (rupture) or blood can flow between the layers of the wall of the aorta through a tear (aorticdissection). Both of these conditions can cause bleeding inside the body and can be life threatening unless diagnosed and treated promptly. CAUSES  The exact cause of an abdominal aortic aneurysm is unknown. Some contributing factors are:   A hardening of the arteries caused by the buildup of fat and other substances in the lining of a blood vessel (arteriosclerosis).  Inflammation of the walls of an artery (arteritis).   Connective tissue diseases, such as Marfan syndrome.   Abdominal trauma.   An infection, such as syphilis or staphylococcus, in the wall of the aorta (infectious aortitis) caused by bacteria. RISK FACTORS  Risk factors that contribute to an abdominal aortic aneurysm may include:  Age older than 60 years.   High blood pressure (hypertension).  Male gender.  Ethnicity (white race).  Obesity.  Family history of aneurysm (first degree relatives only).  Tobacco use. PREVENTION  The following healthy lifestyle habits may help decrease your risk of abdominal aortic aneurysm:  Quitting smoking. Smoking can raise your blood pressure and cause arteriosclerosis.  Limiting or avoiding alcohol.  Keeping your blood pressure, blood sugar level, and cholesterol levels within normal limits.  Decreasing your salt intake. In somepeople, too much salt can raise blood pressure and increase your risk of abdominal aortic aneurysm.  Eating a diet low in saturated fats and cholesterol.  Increasing your fiber intake by including  whole grains, vegetables, and fruits in your diet. Eating these foods may help lower blood pressure.  Maintaining a healthy weight.  Staying physically active and exercising regularly. SYMPTOMS  The symptoms of abdominal aortic aneurysm may vary depending on the size and rate of growth of the aneurysm.Most grow slowly and do not have any symptoms. When symptoms do occur, they may include:  Pain (abdomen, side, lower back, or groin). The pain may vary in intensity. A sudden onset of severe pain may indicate that the aneurysm has ruptured.  Feeling full after eating only small amounts of food.  Nausea or vomiting or both.  Feeling a pulsating lump in the abdomen.  Feeling faint or passing out. DIAGNOSIS  Since most unruptured abdominal aortic aneurysms have no symptoms, they are often discovered during diagnostic exams for other conditions. An aneurysm may be found during the following procedures:  Ultrasonography (A one-time screening for abdominal aortic aneurysm by ultrasonography is also recommended for all men aged 65-75 years who have ever smoked).  X-ray exams.  A computed tomography (CT).  Magnetic resonance imaging (MRI).  Angiography or arteriography. TREATMENT  Treatment of an abdominal aortic aneurysm depends on the size of your aneurysm, your age, and risk factors for rupture. Medication to control blood pressure and pain may be used to manage aneurysms smaller than 6 cm. Regular monitoring for enlargement may be recommended by your caregiver if:  The aneurysm is 3-4 cm in size (an annual ultrasonography may be recommended).  The aneurysm is 4-4.5 cm in size (an ultrasonography every 6 months may be recommended).  The aneurysm is larger than 4.5 cm in   size (your caregiver may ask that you be examined by a vascular surgeon). If your aneurysm is larger than 6 cm, surgical repair may be recommended. There are two main methods for repair of an aneurysm:   Endovascular  repair (a minimally invasive surgery). This is done most often.  Open repair. This method is used if an endovascular repair is not possible.   This information is not intended to replace advice given to you by your health care provider. Make sure you discuss any questions you have with your health care provider.   Document Released: 02/25/2005 Document Revised: 09/12/2012 Document Reviewed: 06/17/2012 Elsevier Interactive Patient Education 2016 Elsevier Inc.  

## 2015-05-02 NOTE — Progress Notes (Signed)
[facility] [place]  Established Abdominal Aortic Aneurysm  History of Present Illness  Omar Gomez is a 81 y.o. (09-28-1924) male patient of Dr. Oneida Alar who returns for followup today for abdominal aortic aneurysm. Ultrasound in 2011 showed the aneurysm was 4.2 cm in diameter. He continues to deny any abdominal pain. He has chronic back pain, states he was told that he has arthritis in his back and knee.  The patient does not seem to have claudication symptoms as he cannot walk much due to significant bilateral knee pain. The patient denies history of stroke or TIA symptoms.  Pt Diabetic: Yes Pt smoker: former smoker, quit in the 1960's   Past Medical History  Diagnosis Date  . AAA (abdominal aortic aneurysm) ([place])   . Diabetes mellitus without complication ([place])   . Hypertension   . Hyperlipidemia   . Cancer ([place])     PROSTATE CANCER TREATED WITH RADIATION  . Shingles 2011  . Anal fissure   . Kidney stones   . Narcolepsy   . OA (osteoarthritis) of knee   . Bladder stone   . Biceps tendon rupture   . Cataract   . Olecranon bursitis   . Dermatitis contact, eyelid   . Blepharitis    Past Surgical History  Procedure Laterality Date  . Hernia repair      1980  . Facial cosmetic surgery      Lesion removed to rule out melanoma   Social History Social History   Social History  . Marital Status: Married    Spouse Name: N/A  . Number of Children: N/A  . Years of Education: N/A   Occupational History  . Not on file.   Social History Main Topics  . Smoking status: Former Smoker    Quit date: 06/02/1963  . Smokeless tobacco: Never Used  . Alcohol Use: No  . Drug Use: No  . Sexual Activity: Not on file   Other Topics Concern  . Not on file   Social History Narrative   Family History Family History  Problem Relation Age of Onset  . Alcohol abuse Brother   . Heart disease Brother     Current Outpatient Prescriptions on File Prior to  Visit  Medication Sig Dispense Refill  . acetaminophen (TYLENOL) 500 MG tablet Take 500 mg by mouth every 6 (six) hours as needed. Take 2 tablets every 6 hours.    Marland Kitchen amLODipine (NORVASC) 5 MG tablet Take 5 mg by mouth daily.    Marland Kitchen aspirin 81 MG tablet Take 81 mg by mouth as needed. Pt takes one tablet every two days.    Marland Kitchen atenolol (TENORMIN) 50 MG tablet Take 50 mg by mouth daily. Takes 1/2 tablet daily.    Marland Kitchen atorvastatin (LIPITOR) 80 MG tablet Take 40 mg by mouth daily.     Marland Kitchen diltiazem 2 % GEL Apply 1 application topically daily.     Marland Kitchen ezetimibe (ZETIA) 10 MG tablet Take 10 mg by mouth daily.    . hydrochlorothiazide (HYDRODIURIL) 25 MG tablet Take 25 mg by mouth daily.     No current facility-administered medications on file prior to visit.   No Known Allergies  ROS: See HPI for pertinent positives and negatives.  Physical Examination  Filed Vitals:   05/02/15 0936  BP: 119/70  Pulse: 53  Temp: 97.7 F (36.5 C)  TempSrc: Oral  Resp: 14  Height: 5' 5.5" (1.664 m)  Weight: 202 lb (91.627 kg)  SpO2:  95%   Body mass index is 33.09 kg/(m^2).  General: A&O x 3, WD, obese male.  Pulmonary:Respirations are not labored.  Cardiac: RRR, Nl S1, S2, no appreciable murmur.   Carotid Bruits Right Left   Negative Negative  Aorta is not palpable Radial pulses are 2+ palpable and =   VASCULAR EXAM:     LE Pulses Right Left   FEMORAL 2+ palpable  2+ palpable     POPLITEAL not palpable  not palpable   POSTERIOR TIBIAL not palpable  not palpable    DORSALIS PEDIS  ANTERIOR TIBIAL faintly palpable  faintly palpable      Gastrointestinal: soft, NTND, -G/R, - HSM, - masses palpated, - CVAT B.  Musculoskeletal: M/S 4/5 throughout, Extremities without ischemic changes. Feet are  pink and warm.  Neurologic: CN 2-12 intact except some hearing loss, pain and light touch intact in extremities are intact, Motor exam as listed above.         Non-Invasive Vascular Imaging  AAA Duplex (05/02/2015) ABDOMINAL AORTA DUPLEX EVALUATION    INDICATION: Evaluation of abdominal aorta.    PREVIOUS INTERVENTION(S):     DUPLEX EXAM:     LOCATION DIAMETER AP (cm) DIAMETER TRANSVERSE (cm) VELOCITIES (cm/sec)  Aorta Proximal 3.03  40  Aorta Mid 4.41 4.24 47  Aorta Distal 2.11    Right Common Iliac Artery 1.63  101  Left Common Iliac Artery Not Visualized  Not Visualized      Previous max aortic diameter:  4.46 x 4.46 Date: 10/18/2014     ADDITIONAL FINDINGS: Technically difficult exam due to overlying bowel gas.    IMPRESSION: Patent abdominal aortic aneurysm measuring approximately 4.11 x 4.24 cm in diameter.     Compared to the previous exam:  No significant change in comparison to the last exam on 10/18/2014.     Medical Decision Making  The patient is a 81 y.o. male who presents with no symptoms referable to AAA with no increase in size.   Based on this patient's exam and diagnostic studies, the patient will follow up in 1 year  with the following studies: AAA duplex.  Consideration for repair of AAA would be made when the size is 5.5 cm, growth > 1 cm/yr, and symptomatic status.  I emphasized the importance of maximal medical management including strict control of blood pressure, blood glucose, and lipid levels, antiplatelet agents, obtaining regular exercise, and continued  cessation of smoking.   The patient was given information about AAA including signs, symptoms, treatment, and how to minimize the risk of enlargement and rupture of aneurysms.    The patient was advised to call 911 should the patient experience sudden onset abdominal or back pain.   Thank you for allowing [place] to participate in this patient's care.  Clemon Chambers, RN, MSN, FNP-C [facility] [place] Office: [facility] Physician: Oneida Alar  05/02/2015, 9:44 AM

## 2015-07-23 DIAGNOSIS — H02833 Dermatochalasis of right eye, unspecified eyelid: Secondary | ICD-10-CM | POA: Diagnosis not present

## 2015-07-23 DIAGNOSIS — H04213 Epiphora due to excess lacrimation, bilateral lacrimal glands: Secondary | ICD-10-CM | POA: Diagnosis not present

## 2015-07-23 DIAGNOSIS — H16141 Punctate keratitis, right eye: Secondary | ICD-10-CM | POA: Diagnosis not present

## 2015-07-23 DIAGNOSIS — H40013 Open angle with borderline findings, low risk, bilateral: Secondary | ICD-10-CM | POA: Diagnosis not present

## 2015-10-29 DIAGNOSIS — I714 Abdominal aortic aneurysm, without rupture: Secondary | ICD-10-CM | POA: Diagnosis not present

## 2015-10-29 DIAGNOSIS — I10 Essential (primary) hypertension: Secondary | ICD-10-CM | POA: Diagnosis not present

## 2015-10-29 DIAGNOSIS — E7800 Pure hypercholesterolemia, unspecified: Secondary | ICD-10-CM | POA: Diagnosis not present

## 2015-10-29 DIAGNOSIS — C61 Malignant neoplasm of prostate: Secondary | ICD-10-CM | POA: Diagnosis not present

## 2015-10-29 DIAGNOSIS — E1140 Type 2 diabetes mellitus with diabetic neuropathy, unspecified: Secondary | ICD-10-CM | POA: Diagnosis not present

## 2015-10-29 DIAGNOSIS — N183 Chronic kidney disease, stage 3 (moderate): Secondary | ICD-10-CM | POA: Diagnosis not present

## 2015-10-29 DIAGNOSIS — E1142 Type 2 diabetes mellitus with diabetic polyneuropathy: Secondary | ICD-10-CM | POA: Diagnosis not present

## 2015-12-09 DIAGNOSIS — K624 Stenosis of anus and rectum: Secondary | ICD-10-CM | POA: Diagnosis not present

## 2015-12-09 DIAGNOSIS — C61 Malignant neoplasm of prostate: Secondary | ICD-10-CM | POA: Diagnosis not present

## 2015-12-16 DIAGNOSIS — C61 Malignant neoplasm of prostate: Secondary | ICD-10-CM | POA: Diagnosis not present

## 2015-12-16 DIAGNOSIS — K624 Stenosis of anus and rectum: Secondary | ICD-10-CM | POA: Diagnosis not present

## 2016-01-23 DIAGNOSIS — H35033 Hypertensive retinopathy, bilateral: Secondary | ICD-10-CM | POA: Diagnosis not present

## 2016-01-23 DIAGNOSIS — H40013 Open angle with borderline findings, low risk, bilateral: Secondary | ICD-10-CM | POA: Diagnosis not present

## 2016-01-23 DIAGNOSIS — Z8679 Personal history of other diseases of the circulatory system: Secondary | ICD-10-CM | POA: Diagnosis not present

## 2016-01-23 DIAGNOSIS — H35371 Puckering of macula, right eye: Secondary | ICD-10-CM | POA: Diagnosis not present

## 2016-02-24 DIAGNOSIS — K624 Stenosis of anus and rectum: Secondary | ICD-10-CM | POA: Diagnosis not present

## 2016-03-03 DIAGNOSIS — E1142 Type 2 diabetes mellitus with diabetic polyneuropathy: Secondary | ICD-10-CM | POA: Diagnosis not present

## 2016-03-03 DIAGNOSIS — C61 Malignant neoplasm of prostate: Secondary | ICD-10-CM | POA: Diagnosis not present

## 2016-03-03 DIAGNOSIS — E1140 Type 2 diabetes mellitus with diabetic neuropathy, unspecified: Secondary | ICD-10-CM | POA: Diagnosis not present

## 2016-03-03 DIAGNOSIS — E7800 Pure hypercholesterolemia, unspecified: Secondary | ICD-10-CM | POA: Diagnosis not present

## 2016-03-03 DIAGNOSIS — E1122 Type 2 diabetes mellitus with diabetic chronic kidney disease: Secondary | ICD-10-CM | POA: Diagnosis not present

## 2016-03-03 DIAGNOSIS — N183 Chronic kidney disease, stage 3 (moderate): Secondary | ICD-10-CM | POA: Diagnosis not present

## 2016-03-03 DIAGNOSIS — I10 Essential (primary) hypertension: Secondary | ICD-10-CM | POA: Diagnosis not present

## 2016-03-03 DIAGNOSIS — I714 Abdominal aortic aneurysm, without rupture: Secondary | ICD-10-CM | POA: Diagnosis not present

## 2016-03-03 DIAGNOSIS — I739 Peripheral vascular disease, unspecified: Secondary | ICD-10-CM | POA: Diagnosis not present

## 2016-03-03 DIAGNOSIS — Z7984 Long term (current) use of oral hypoglycemic drugs: Secondary | ICD-10-CM | POA: Diagnosis not present

## 2016-03-04 ENCOUNTER — Other Ambulatory Visit: Payer: Self-pay | Admitting: *Deleted

## 2016-03-04 ENCOUNTER — Telehealth (HOSPITAL_COMMUNITY): Payer: Self-pay | Admitting: Vascular Surgery

## 2016-03-04 DIAGNOSIS — I714 Abdominal aortic aneurysm, without rupture, unspecified: Secondary | ICD-10-CM

## 2016-03-04 NOTE — Telephone Encounter (Signed)
-----   Message from Viann Fish, NP sent at 03/04/2016  2:46 PM EDT ----- Regarding: FW: Follow-up appt question from pts wife Contact: [place], Please see Dr. Oneida Alar response, 1 year, Thank you, Vinnie Level  ----- Message ----- From: Elam Dutch, MD Sent: 03/04/2016   1:52 PM To: Sharmon Leyden Nickel, NP Subject: RE: Follow-up appt question from pts wife      1 year is fine ----- Message ----- From: Viann Fish, NP Sent: 03/04/2016  12:16 PM To: Elam Dutch, MD, Rufina Falco Subject: RE: Follow-up appt question from pts wife      Rip Harbour, Looking back at his earliest result on file, in May of 2015 his AAA measured 4.4 cm, the same as his last visit with me on 05/02/15. I suspect Dr. Oneida Alar wanted to see him every 6 months initially to see if the aneurysm would much bigger, but it has remained the same size for 1 1/2 years. But I cc'd your message and my response to Dr. Oneida Alar so that he may advise. Thank you, Vinnie Level  ----- Message ----- From: Rufina Falco Sent: 03/04/2016  11:37 AM To: Sharmon Leyden Nickel, NP Subject: Follow-up appt question from pts wife          When Mr. Breitenbach saw Dr. Oneida Alar 04/19/2014, he recommended 6 month follow-ups. Mr. Ally returned 10/18/2014 saw you and you recommended 6 month follow-up.   Then the last time he was here 12/11/201, saw you again- a yearly u/s was recommended.    The wife would like to confirm which it should be.   She indicated no changes were seen in the ultrasound.  She would like me to call her today to follow up with her.     Thanks! Rip Harbour

## 2016-05-04 ENCOUNTER — Encounter: Payer: Self-pay | Admitting: Family

## 2016-05-07 ENCOUNTER — Ambulatory Visit: Payer: Self-pay | Admitting: Family

## 2016-05-07 ENCOUNTER — Inpatient Hospital Stay (HOSPITAL_COMMUNITY): Admission: RE | Admit: 2016-05-07 | Payer: Self-pay | Source: Ambulatory Visit

## 2016-05-12 ENCOUNTER — Ambulatory Visit (INDEPENDENT_AMBULATORY_CARE_PROVIDER_SITE_OTHER): Payer: Medicare Other | Admitting: Family

## 2016-05-12 ENCOUNTER — Ambulatory Visit (HOSPITAL_COMMUNITY)
Admission: RE | Admit: 2016-05-12 | Discharge: 2016-05-12 | Disposition: A | Discharge status: Home / Self Care | Payer: Medicare Other | Source: Ambulatory Visit | Attending: Vascular Surgery | Admitting: Vascular Surgery

## 2016-05-12 ENCOUNTER — Encounter: Payer: Self-pay | Admitting: Family

## 2016-05-12 VITALS — BP 130/66 | HR 54 | Temp 97.4°F | Resp 18 | Wt 196.0 lb

## 2016-05-12 DIAGNOSIS — I714 Abdominal aortic aneurysm, without rupture, unspecified: Secondary | ICD-10-CM

## 2016-05-12 NOTE — Patient Instructions (Signed)
Abdominal Aortic Aneurysm Blood pumps away from the heart through tubes (blood vessels) called arteries. Aneurysms are weak or damaged places in the wall of an artery. It bulges out like a balloon. An abdominal aortic aneurysm happens in the main artery of the body (aorta). It can burst or tear, causing bleeding inside the body. This is an emergency. It needs treatment right away. What are the causes? The exact cause is unknown. Things that could cause this problem include:  Fat and other substances building up in the lining of a tube.  Swelling of the walls of a blood vessel.  Certain tissue diseases.  Belly (abdominal) trauma.  An infection in the main artery of the body.  What increases the risk? There are things that make it more likely for you to have an aneurysm. These include:  Being over the age of 80 years old.  Having high blood pressure (hypertension).  Being a male.  Being white.  Being very overweight (obese).  Having a family history of aneurysm.  Using tobacco products.  What are the signs or symptoms? Symptoms depend on the size of the aneurysm and how fast it grows. There may not be symptoms. If symptoms occur, they can include:  Pain (belly, side, lower back, or groin).  Feeling full after eating a small amount of food.  Feeling sick to your stomach (nauseous), throwing up (vomiting), or both.  Feeling a lump in your belly that feels like it is beating (pulsating).  Feeling like you will pass out (faint).  How is this treated?  Medicine to control blood pressure and pain.  Imaging tests to see if the aneurysm gets bigger.  Surgery. How is this prevented? To lessen your chance of getting this condition:  Stop smoking. Stop chewing tobacco.  Limit or avoid alcohol.  Keep your blood pressure, blood sugar, and cholesterol within normal limits.  Eat less salt.  Eat foods low in saturated fats and cholesterol. These are found in animal and  whole dairy products.  Eat more fiber. Fiber is found in whole grains, vegetables, and fruits.  Keep a healthy weight.  Stay active and exercise often.  This information is not intended to replace advice given to you by your health care provider. Make sure you discuss any questions you have with your health care provider. Document Released: 09/12/2012 Document Revised: 10/24/2015 Document Reviewed: 06/17/2012 Elsevier Interactive Patient Education  2017 Elsevier Inc.  

## 2016-05-12 NOTE — Progress Notes (Signed)
[facility] [place]   CC: Follow up Abdominal Aortic Aneurysm  History of Present Illness  Omar Gomez is a 82 y.o. (February 10, 1925) male patient of Dr. Oneida Alar who returns for followup today for abdominal aortic aneurysm. Ultrasound in 2011 showed the aneurysm was 4.2 cm in diameter. He continues to deny any abdominal pain. He has chronic back pain, states he was told that he has arthritis in his back and knee.  The patient does not seem to have claudication symptoms as he cannot walk much due to significant bilateral knee pain. The patient denies history of stroke or TIA symptoms.  Pt states he rides 30 minutes daily on his stationary bike.   Wife states pt snores and falls asleep easily during the day.   Pt Diabetic: Yes Pt smoker: former smoker, quit in the 1960's    Past Medical History:  Diagnosis Date  . AAA (abdominal aortic aneurysm) ([place])   . Anal fissure   . Biceps tendon rupture   . Bladder stone   . Blepharitis   . Cancer ([place])    PROSTATE CANCER TREATED WITH RADIATION  . Cataract   . Dermatitis contact, eyelid   . Diabetes mellitus without complication ([place])   . Hyperlipidemia   . Hypertension   . Kidney stones   . Narcolepsy   . OA (osteoarthritis) of knee   . Olecranon bursitis   . Shingles 2011   Past Surgical History:  Procedure Laterality Date  . FACIAL COSMETIC SURGERY     Lesion removed to rule out melanoma  . HERNIA REPAIR     1980   Social History Social History   Social History  . Marital status: Married    Spouse name: N/A  . Number of children: N/A  . Years of education: N/A   Occupational History  . Not on file.   Social History Main Topics  . Smoking status: Former Smoker    Quit date: 06/02/1963  . Smokeless tobacco: Never Used  . Alcohol use No  . Drug use: No  . Sexual activity: Not on file   Other Topics Concern  . Not on file   Social History Narrative  . No narrative on file   Family  History Family History  Problem Relation Age of Onset  . Alcohol abuse Brother   . Heart disease Brother     Current Outpatient Prescriptions on File Prior to Visit  Medication Sig Dispense Refill  . acetaminophen (TYLENOL) 500 MG tablet Take 500 mg by mouth every 6 (six) hours as needed. Take 2 tablets every 6 hours.    Marland Kitchen amLODipine (NORVASC) 5 MG tablet Take 5 mg by mouth daily.    Marland Kitchen aspirin 81 MG tablet Take 81 mg by mouth as needed. Pt takes one tablet every two days.    Marland Kitchen atenolol (TENORMIN) 50 MG tablet Take 50 mg by mouth daily. Takes 1/2 tablet daily.    Marland Kitchen atorvastatin (LIPITOR) 80 MG tablet Take 40 mg by mouth daily.     Marland Kitchen diltiazem 2 % GEL Apply 1 application topically daily.     Marland Kitchen ezetimibe (ZETIA) 10 MG tablet Take 10 mg by mouth daily.    . hydrochlorothiazide (HYDRODIURIL) 25 MG tablet Take 25 mg by mouth daily.     No current facility-administered medications on file prior to visit.    No Known Allergies  ROS: See HPI for pertinent positives and negatives.  Physical Examination  Vitals:   05/12/16  0934  BP: 130/66  Pulse: (!) 54  Resp: 18  Temp: 97.4 F (36.3 C)  SpO2: 96%  Weight: 196 lb (88.9 kg)   Body mass index is 32.12 kg/m.  General: A&O x 3, WD, obese male.  Pulmonary:Respirations are not labored, CTAB, adequate air movement in all fields.  Cardiac: RRR, Nl S1, S2, no appreciable murmur.   Carotid Bruits Right Left   Negative Negative  Aorta is not palpable Radial pulses are 2+ palpable and =   VASCULAR EXAM:     LE Pulses Right Left   FEMORAL not palpable seated in chair not palpable seated in chair    POPLITEAL not palpable  not palpable   POSTERIOR TIBIAL not palpable  not palpable    DORSALIS PEDIS  ANTERIOR TIBIAL faintly  palpable  faintly palpable      Gastrointestinal: soft, NTND, -G/R, - HSM, - masses palpated, - CVAT B.  Musculoskeletal: M/S 4/5 throughout, Extremities without ischemic changes. Feet are pink and warm.  Neurologic: CN 2-12 intact except significant hearing loss, pain and light touch intact in extremities are intact, Motor exam as listed above.   Non-Invasive Vascular Imaging  AAA Duplex (05/12/2016)  Previous size: 4.4 cm (Date: 05-02-15)  Current size:  4.7 cm (Date: 05-12-16). Right CIA: 1.9 cm; Left CIA: 1.82 cm  Medical Decision Making  The patient is a 82 y.o. male who presents with asymptomatic AAA with a slight increase in size in a year: from 4.4 cm to 4.7 cm today.  I advised pt and wife to discuss with his PCP whether sleep studies or a Sleep Medicine evaluation would be appropriate for him since he snores and falls asleep easily during the day.    Based on this patient's exam and diagnostic studies, the patient will follow up in 6 months  with the following studies: AAA duplex.  Consideration for repair of AAA would be made when the size is 5.0 cm, growth > 1 cm/yr, and symptomatic status.  I emphasized the importance of maximal medical management including strict control of blood pressure, blood glucose, and lipid levels, antiplatelet agents, obtaining regular exercise, and continued  cessation of smoking.   The patient was given information about AAA including signs, symptoms, treatment, and how to minimize the risk of enlargement and rupture of aneurysms.    The patient was advised to call 911 should the patient experience sudden onset abdominal or back pain.   Thank you for allowing [place] to participate in this patient's care.  Clemon Chambers, RN, MSN, FNP-C [facility] [place] Office: [phone]  Clinic Physician: [place]  05/12/2016, 9:40 AM

## 2016-05-19 NOTE — Addendum Note (Signed)
Addended by: Lianne Cure A on: 05/19/2016 09:29 AM   Modules accepted: Orders

## 2016-06-04 DIAGNOSIS — E1159 Type 2 diabetes mellitus with other circulatory complications: Secondary | ICD-10-CM | POA: Diagnosis not present

## 2016-06-04 DIAGNOSIS — C61 Malignant neoplasm of prostate: Secondary | ICD-10-CM | POA: Diagnosis not present

## 2016-06-04 DIAGNOSIS — E1142 Type 2 diabetes mellitus with diabetic polyneuropathy: Secondary | ICD-10-CM | POA: Diagnosis not present

## 2016-06-04 DIAGNOSIS — N183 Chronic kidney disease, stage 3 (moderate): Secondary | ICD-10-CM | POA: Diagnosis not present

## 2016-06-04 DIAGNOSIS — I714 Abdominal aortic aneurysm, without rupture: Secondary | ICD-10-CM | POA: Diagnosis not present

## 2016-06-04 DIAGNOSIS — Z1389 Encounter for screening for other disorder: Secondary | ICD-10-CM | POA: Diagnosis not present

## 2016-06-04 DIAGNOSIS — E1140 Type 2 diabetes mellitus with diabetic neuropathy, unspecified: Secondary | ICD-10-CM | POA: Diagnosis not present

## 2016-06-04 DIAGNOSIS — E1122 Type 2 diabetes mellitus with diabetic chronic kidney disease: Secondary | ICD-10-CM | POA: Diagnosis not present

## 2016-06-04 DIAGNOSIS — I10 Essential (primary) hypertension: Secondary | ICD-10-CM | POA: Diagnosis not present

## 2016-06-04 DIAGNOSIS — I739 Peripheral vascular disease, unspecified: Secondary | ICD-10-CM | POA: Diagnosis not present

## 2016-06-25 DIAGNOSIS — C61 Malignant neoplasm of prostate: Secondary | ICD-10-CM | POA: Diagnosis not present

## 2016-08-24 DIAGNOSIS — H40013 Open angle with borderline findings, low risk, bilateral: Secondary | ICD-10-CM | POA: Diagnosis not present

## 2016-08-24 DIAGNOSIS — H04123 Dry eye syndrome of bilateral lacrimal glands: Secondary | ICD-10-CM | POA: Diagnosis not present

## 2016-08-24 DIAGNOSIS — H16149 Punctate keratitis, unspecified eye: Secondary | ICD-10-CM | POA: Diagnosis not present

## 2016-08-24 DIAGNOSIS — H1851 Endothelial corneal dystrophy: Secondary | ICD-10-CM | POA: Diagnosis not present

## 2016-10-12 DIAGNOSIS — I10 Essential (primary) hypertension: Secondary | ICD-10-CM | POA: Diagnosis not present

## 2016-10-12 DIAGNOSIS — E1142 Type 2 diabetes mellitus with diabetic polyneuropathy: Secondary | ICD-10-CM | POA: Diagnosis not present

## 2016-10-12 DIAGNOSIS — C61 Malignant neoplasm of prostate: Secondary | ICD-10-CM | POA: Diagnosis not present

## 2016-10-12 DIAGNOSIS — E1140 Type 2 diabetes mellitus with diabetic neuropathy, unspecified: Secondary | ICD-10-CM | POA: Diagnosis not present

## 2016-11-04 ENCOUNTER — Ambulatory Visit: Payer: Medicare Other | Admitting: Podiatry

## 2016-11-11 ENCOUNTER — Ambulatory Visit (INDEPENDENT_AMBULATORY_CARE_PROVIDER_SITE_OTHER): Payer: Medicare Other | Admitting: Podiatry

## 2016-11-11 ENCOUNTER — Encounter: Payer: Self-pay | Admitting: Family

## 2016-11-11 ENCOUNTER — Encounter: Payer: Self-pay | Admitting: Podiatry

## 2016-11-11 VITALS — BP 131/69 | HR 67

## 2016-11-11 DIAGNOSIS — E1142 Type 2 diabetes mellitus with diabetic polyneuropathy: Secondary | ICD-10-CM

## 2016-11-11 DIAGNOSIS — E1151 Type 2 diabetes mellitus with diabetic peripheral angiopathy without gangrene: Secondary | ICD-10-CM

## 2016-11-11 DIAGNOSIS — B351 Tinea unguium: Secondary | ICD-10-CM | POA: Diagnosis not present

## 2016-11-11 NOTE — Patient Instructions (Signed)

## 2016-11-11 NOTE — Progress Notes (Signed)
   Subjective:    Patient ID: Omar Gomez, male    DOB: 11-Nov-1924, 83 y.o.   MRN: [id number]  HPI   This diabetic patient presents today with his wife present in the treatment room complaining of approximately 3 year history of toenails and gradually become thicker, deformed and are uncomfortable walking wearing shoes. Patient or patient's wife are unable to trim the toenails are request toenail debridement. Patient is diabetic without history of ulceration or claudication Denies burning numbness or pain  History of difficulty hearing with his wife helping to answer questions because of hearing difficulty    Review of Systems  Skin: Positive for color change.       Objective:   Physical Exam  Patient appears pleasant and able to respond to questioning with assist of his wife because of hearing difficulty  Vascular: Bilateral peripheral pitting edema bilaterally DP pulses 1/4 bilaterally PT pulses 0/4 bilaterally Capillary reflex immediate bilaterally  Neurological: Sensation to 10 g monofilament wire intact 3/5 right 2/5 left Vibratory sensation nonreactive bilaterally Ankle reflex reactive bilaterally  Dermatological: No open skin lesions bilaterally Atrophic symptoms absent hair growth bilaterally The toenails elongated, brittle, hypertrophic, discolored 6-10  Musculoskeletal: Patient walks slowly with walker Hammertoe second bilaterally Manual motor testing dorsi flexion, plantar flexion 5/5 bilaterally bunion as bilaterally       Assessment & Plan:   Assessment: Diabetic peripheral arterial disease in peripheral neuropathy Mycotic toenails 6-10  Plan: At this time because patient has no open lesions are history of claudication will recommend periodic debridement and observation of patient Toenails 6-10 are debrided mechanically and electrically without any bleeding  Reappoint 3 months or sooner if patient has concern

## 2016-11-24 ENCOUNTER — Encounter: Payer: Self-pay | Admitting: Family

## 2016-11-24 ENCOUNTER — Ambulatory Visit (INDEPENDENT_AMBULATORY_CARE_PROVIDER_SITE_OTHER): Payer: Medicare Other | Admitting: Family

## 2016-11-24 ENCOUNTER — Ambulatory Visit (HOSPITAL_COMMUNITY)
Admission: RE | Admit: 2016-11-24 | Discharge: 2016-11-24 | Disposition: A | Discharge status: Home / Self Care | Payer: Medicare Other | Source: Ambulatory Visit | Attending: Vascular Surgery | Admitting: Vascular Surgery

## 2016-11-24 VITALS — BP 126/66 | HR 61 | Temp 97.3°F | Resp 18 | Ht 65.5 in | Wt 194.0 lb

## 2016-11-24 DIAGNOSIS — I714 Abdominal aortic aneurysm, without rupture, unspecified: Secondary | ICD-10-CM

## 2016-11-24 NOTE — Progress Notes (Signed)
[facility] [place]   CC: Follow up Abdominal Aortic Aneurysm  History of Present Illness  Omar Gomez is a 83 y.o. (1924-08-24) male patient of Dr. Oneida Alar who returns for followup today for abdominal aortic aneurysm. Ultrasound in 2011 showed the aneurysm was 4.2 cm in diameter. He continues to deny any abdominal pain. He has chronic back pain, states he was told that he has arthritis in his back and knee.  The patient does not seem to have claudication symptoms as he cannot walk much due to significant bilateral knee pain. The patient denies history of stroke or TIA symptoms.  Wife states he stopped using his stationary bike.   Wife states he snores and he falls asleep during the day; I advised that that they discuss this with his PCP.   Pt Diabetic: Yes, states is borderline, not on DM medication, no A1C result on file Pt smoker: former smoker, quit in the 1960's     Past Medical History:  Diagnosis Date  . AAA (abdominal aortic aneurysm) ([place])   . Anal fissure   . Biceps tendon rupture   . Bladder stone   . Blepharitis   . Cancer ([place])    PROSTATE CANCER TREATED WITH RADIATION  . Cataract   . Dermatitis contact, eyelid   . Diabetes mellitus without complication ([place])   . Hyperlipidemia   . Hypertension   . Kidney stones   . Narcolepsy   . OA (osteoarthritis) of knee   . Olecranon bursitis   . Shingles 2011   Past Surgical History:  Procedure Laterality Date  . FACIAL COSMETIC SURGERY     Lesion removed to rule out melanoma  . HERNIA REPAIR     1980   Social History Social History   Social History  . Marital status: Married    Spouse name: N/A  . Number of children: N/A  . Years of education: N/A   Occupational History  . Not on file.   Social History Main Topics  . Smoking status: Former Smoker    Quit date: 06/02/1963  . Smokeless tobacco: Never Used  . Alcohol use No  . Drug use: No  . Sexual activity: Not on file    Other Topics Concern  . Not on file   Social History Narrative  . No narrative on file   Family History Family History  Problem Relation Age of Onset  . Alcohol abuse Brother   . Heart disease Brother     Current Outpatient Prescriptions on File Prior to Visit  Medication Sig Dispense Refill  . amLODipine (NORVASC) 5 MG tablet Take 5 mg by mouth daily.    Marland Kitchen aspirin 81 MG tablet Take 81 mg by mouth as needed. Pt takes one tablet every two days.    Marland Kitchen atenolol (TENORMIN) 50 MG tablet Take 50 mg by mouth daily. Takes 1/2 tablet daily.    Marland Kitchen atorvastatin (LIPITOR) 80 MG tablet Take 40 mg by mouth daily.     Marland Kitchen ezetimibe (ZETIA) 10 MG tablet Take 10 mg by mouth daily.    . hydrochlorothiazide (HYDRODIURIL) 25 MG tablet Take 25 mg by mouth daily.    Marland Kitchen acetaminophen (TYLENOL) 500 MG tablet Take 500 mg by mouth every 6 (six) hours as needed. Take 2 tablets every 6 hours.    Marland Kitchen diltiazem 2 % GEL Apply 1 application topically daily.      No current facility-administered medications on file prior to visit.  No Known Allergies  ROS: See HPI for pertinent positives and negatives.  Physical Examination  Vitals:   11/24/16 0947  BP: 126/66  Pulse: 61  Resp: 18  Temp: 97.3 F (36.3 C)  TempSrc: Oral  SpO2: 95%  Weight: 194 lb (88 kg)  Height: 5' 5.5" (1.664 m)   Body mass index is 31.79 kg/m.  General: A&O x 3, WD, obese male.  Pulmonary:Respirations are not labored, CTAB, adequate air movement in all fields.  Cardiac: RRR, Nl S1, S2, no appreciable murmur.   Carotid Bruits Right Left   Negative Negative  Aorta is not palpable Radial pulses are 2+ palpable and =   VASCULAR EXAM:     LE Pulses Right Left   FEMORAL not palpable seated in chair not palpable seated in chair    POPLITEAL not  palpable  not palpable   POSTERIOR TIBIAL not palpable  not palpable    DORSALIS PEDIS  ANTERIOR TIBIAL faintly palpable  faintly palpable      Gastrointestinal: soft, NTND, -G/R, - HSM, - masses palpated, - CVAT B.  Musculoskeletal: M/S 4/5 throughout, Extremities without ischemic changes. Feet are pink and warm.  Neurologic: CN 2-12 intact except significant hearing loss, pain and light touch intact in extremities are intact, Motor exam as listed above   Non-Invasive Vascular Imaging  AAA Duplex (11/24/2016)  Previous size: 4.7 cm (Date: 05-12-16). Right CIA: 1.9 cm; Left CIA: 1.82 cm   Current size:  4.9 cm (Date: 11/24/16), Right CIA: 2.0 cm; Left CIA: 2.0 cm  Medical Decision Making  The patient is a 83 y.o. male who presents with asymptomatic AAA with slight increase in size, to 4.9 cm today from 4.7 cm on 05-12-16.   Based on this patient's exam and diagnostic studies, the patient will follow up in 4 months  with the following studies: CTA abd/pelvis, see Dr. Oneida Alar afterward.  Consideration for repair of AAA would be made when the size is 5.5 cm, growth > 1 cm/yr, and symptomatic status.        The patient was given information about AAA including signs, symptoms, treatment, and how to minimize the risk of enlargement and rupture of aneurysms.    I emphasized the importance of maximal medical management including strict control of blood pressure, blood glucose, and lipid levels, antiplatelet agents, obtaining regular exercise, and continued cessation of smoking.   The patient was advised to call 911 should the patient experience sudden onset abdominal or back pain.   Thank you for allowing [place] to participate in this patient's care.  Clemon Chambers, RN, MSN, FNP-C [facility] [place] Office: [facility] Physician: Early  11/24/2016, 9:55 AM

## 2016-11-24 NOTE — Patient Instructions (Signed)
Abdominal Aortic Aneurysm Blood pumps away from the heart through tubes (blood vessels) called arteries. Aneurysms are weak or damaged places in the wall of an artery. It bulges out like a balloon. An abdominal aortic aneurysm happens in the main artery of the body (aorta). It can burst or tear, causing bleeding inside the body. This is an emergency. It needs treatment right away. What are the causes? The exact cause is unknown. Things that could cause this problem include:  Fat and other substances building up in the lining of a tube.  Swelling of the walls of a blood vessel.  Certain tissue diseases.  Belly (abdominal) trauma.  An infection in the main artery of the body.  What increases the risk? There are things that make it more likely for you to have an aneurysm. These include:  Being over the age of 81 years old.  Having high blood pressure (hypertension).  Being a male.  Being white.  Being very overweight (obese).  Having a family history of aneurysm.  Using tobacco products.  What are the signs or symptoms? Symptoms depend on the size of the aneurysm and how fast it grows. There may not be symptoms. If symptoms occur, they can include:  Pain (belly, side, lower back, or groin).  Feeling full after eating a small amount of food.  Feeling sick to your stomach (nauseous), throwing up (vomiting), or both.  Feeling a lump in your belly that feels like it is beating (pulsating).  Feeling like you will pass out (faint).  How is this treated?  Medicine to control blood pressure and pain.  Imaging tests to see if the aneurysm gets bigger.  Surgery. How is this prevented? To lessen your chance of getting this condition:  Stop smoking. Stop chewing tobacco.  Limit or avoid alcohol.  Keep your blood pressure, blood sugar, and cholesterol within normal limits.  Eat less salt.  Eat foods low in saturated fats and cholesterol. These are found in animal and  whole dairy products.  Eat more fiber. Fiber is found in whole grains, vegetables, and fruits.  Keep a healthy weight.  Stay active and exercise often.  This information is not intended to replace advice given to you by your health care provider. Make sure you discuss any questions you have with your health care provider. Document Released: 09/12/2012 Document Revised: 10/24/2015 Document Reviewed: 06/17/2012 Elsevier Interactive Patient Education  2017 Elsevier Inc.  

## 2016-12-24 DIAGNOSIS — C61 Malignant neoplasm of prostate: Secondary | ICD-10-CM | POA: Diagnosis not present

## 2017-01-14 DIAGNOSIS — C61 Malignant neoplasm of prostate: Secondary | ICD-10-CM | POA: Diagnosis not present

## 2017-01-15 ENCOUNTER — Other Ambulatory Visit: Payer: Self-pay | Admitting: Urology

## 2017-01-15 DIAGNOSIS — C61 Malignant neoplasm of prostate: Secondary | ICD-10-CM

## 2017-01-29 DIAGNOSIS — I10 Essential (primary) hypertension: Secondary | ICD-10-CM | POA: Diagnosis not present

## 2017-01-29 DIAGNOSIS — M179 Osteoarthritis of knee, unspecified: Secondary | ICD-10-CM | POA: Diagnosis not present

## 2017-01-29 DIAGNOSIS — E1140 Type 2 diabetes mellitus with diabetic neuropathy, unspecified: Secondary | ICD-10-CM | POA: Diagnosis not present

## 2017-01-29 DIAGNOSIS — N183 Chronic kidney disease, stage 3 (moderate): Secondary | ICD-10-CM | POA: Diagnosis not present

## 2017-02-15 ENCOUNTER — Encounter (HOSPITAL_COMMUNITY)
Admission: RE | Admit: 2017-02-15 | Discharge: 2017-02-15 | Disposition: A | Discharge status: Home / Self Care | Payer: Medicare Other | Source: Ambulatory Visit | Attending: Urology | Admitting: Urology

## 2017-02-15 DIAGNOSIS — C61 Malignant neoplasm of prostate: Secondary | ICD-10-CM | POA: Insufficient documentation

## 2017-02-15 MED ORDER — TECHNETIUM TC 99M MEDRONATE IV KIT
25.0000 | PACK | Freq: Once | INTRAVENOUS | Status: AC | PRN
  Administered 2017-02-15: 25 via INTRAVENOUS

## 2017-02-17 ENCOUNTER — Ambulatory Visit: Payer: Medicare Other | Admitting: Podiatry

## 2017-02-25 ENCOUNTER — Telehealth: Payer: Self-pay | Admitting: Vascular Surgery

## 2017-02-25 NOTE — Telephone Encounter (Signed)
Sched appt 03/10/17 at 8:45. Lm on hm#.

## 2017-02-25 NOTE — Telephone Encounter (Signed)
-----   Message from Viann Fish, NP sent at 02/25/2017  1:17 PM EDT ----- Regarding: call pt, schedule to see Dr. Oneida Alar Dear Schedulers, I received a fax from [facility] that is CTA abd/pelvis radiologist report of same performed on 02-15-17. They are evaluating due to hx of prostate cancer and elevated PSA.  We have been monitoring his AAA, which this report shows is 5.5 cm, larger than on duplex in June 2018, was 4.9 cm  Plan was to have pt have a CTA abd/pelvis 6 months from when I saw him in June 2018.  Instead, pt needs to be scheduled to see Dr. Oneida Alar in the next few weeks, and pt must bring with him the disc of the imaging, which is usually given to pt's after a CT is done.   Thanks, Vinnie Level

## 2017-03-02 ENCOUNTER — Ambulatory Visit: Payer: Medicare Other | Admitting: Podiatry

## 2017-03-05 DIAGNOSIS — C61 Malignant neoplasm of prostate: Secondary | ICD-10-CM | POA: Diagnosis not present

## 2017-03-09 DIAGNOSIS — N182 Chronic kidney disease, stage 2 (mild): Secondary | ICD-10-CM | POA: Diagnosis not present

## 2017-03-09 DIAGNOSIS — E1142 Type 2 diabetes mellitus with diabetic polyneuropathy: Secondary | ICD-10-CM | POA: Diagnosis not present

## 2017-03-09 DIAGNOSIS — I10 Essential (primary) hypertension: Secondary | ICD-10-CM | POA: Diagnosis not present

## 2017-03-09 DIAGNOSIS — E1140 Type 2 diabetes mellitus with diabetic neuropathy, unspecified: Secondary | ICD-10-CM | POA: Diagnosis not present

## 2017-03-10 ENCOUNTER — Encounter: Payer: Self-pay | Admitting: Vascular Surgery

## 2017-03-10 ENCOUNTER — Encounter: Payer: Self-pay | Admitting: Urology

## 2017-03-10 ENCOUNTER — Ambulatory Visit (INDEPENDENT_AMBULATORY_CARE_PROVIDER_SITE_OTHER): Payer: Medicare Other | Admitting: Vascular Surgery

## 2017-03-10 VITALS — BP 120/64 | HR 61 | Temp 98.2°F | Resp 18 | Ht 65.0 in | Wt 191.0 lb

## 2017-03-10 DIAGNOSIS — I714 Abdominal aortic aneurysm, without rupture, unspecified: Secondary | ICD-10-CM

## 2017-03-10 NOTE — Progress Notes (Signed)
Patient name: Omar Gomez MRN: [id number] DOB: 01-Nov-1924 Sex: male  REASON FOR CONSULT: AAA  HPI: Omar Gomez is a 83 y.o. male, for abdominal aortic aneurysm. His AAA was 4.4 cm 2014. It has been slowly growing. He denies any abdominal or back pain. He intermittently has hormone therapy for prostate cancer. He overall is fairly deconditioned and requires a walker to get about. His day consists mostly of moving from one chair to another.  Other medical problems include hyperlipidemia hypertension narcolepsy all of which are been stable. He is on aspirin and a statin.  Past Medical History:  Diagnosis Date  . AAA (abdominal aortic aneurysm) ([place])   . Anal fissure   . Biceps tendon rupture   . Bladder stone   . Blepharitis   . Cancer ([place])    PROSTATE CANCER TREATED WITH RADIATION  . Cataract   . Dermatitis contact, eyelid   . Diabetes mellitus without complication ([place])   . Hyperlipidemia   . Hypertension   . Kidney stones   . Narcolepsy   . OA (osteoarthritis) of knee   . Olecranon bursitis   . Shingles 2011   Past Surgical History:  Procedure Laterality Date  . FACIAL COSMETIC SURGERY     Lesion removed to rule out melanoma  . HERNIA REPAIR     1980    Family History  Problem Relation Age of Onset  . Alcohol abuse Brother   . Heart disease Brother     SOCIAL HISTORY: Social History   Social History  . Marital status: Married    Spouse name: N/A  . Number of children: N/A  . Years of education: N/A   Occupational History  . Not on file.   Social History Main Topics  . Smoking status: Former Smoker    Quit date: 06/02/1963  . Smokeless tobacco: Never Used  . Alcohol use No  . Drug use: No  . Sexual activity: Not on file   Other Topics Concern  . Not on file   Social History Narrative  . No narrative on file    No Known Allergies  Current Outpatient Prescriptions  Medication Sig Dispense Refill  . acetaminophen (TYLENOL) 500 MG tablet  Take 500 mg by mouth every 6 (six) hours as needed. Take 2 tablets every 6 hours.    Marland Kitchen amLODipine (NORVASC) 5 MG tablet Take 5 mg by mouth daily.    Marland Kitchen aspirin 81 MG tablet Take 81 mg by mouth as needed. Pt takes one tablet every two days.    Marland Kitchen atenolol (TENORMIN) 50 MG tablet Take 50 mg by mouth daily. Takes 1/2 tablet daily.    Marland Kitchen atorvastatin (LIPITOR) 80 MG tablet Take 40 mg by mouth daily.     Marland Kitchen diltiazem 2 % GEL Apply 1 application topically daily.     Marland Kitchen ezetimibe (ZETIA) 10 MG tablet Take 10 mg by mouth daily.    . hydrochlorothiazide (HYDRODIURIL) 25 MG tablet Take 25 mg by mouth daily.    . metFORMIN (GLUCOPHAGE) 500 MG tablet TK 1 T PO BID WC  6   No current facility-administered medications for this visit.     ROS:   General:  No weight loss, Fever, chills  HEENT: No recent headaches, no nasal bleeding, no visual changes, no sore throat  Neurologic: No dizziness, blackouts, seizures. No recent symptoms of stroke or mini- stroke. No recent episodes of slurred speech, or temporary blindness.  Cardiac: No recent episodes of  chest pain/pressure, no shortness of breath at rest.  + shortness of breath with exertion.  Denies history of atrial fibrillation or irregular heartbeat  Vascular: No history of rest pain in feet.  No history of claudication.  No history of non-healing ulcer, No history of DVT   Pulmonary: No home oxygen, no productive cough, no hemoptysis,  No asthma or wheezing  Musculoskeletal:  [X]  Arthritis, [X]  Low back pain,  [X]  Joint pain  Hematologic:No history of hypercoagulable state.  No history of easy bleeding.  No history of anemia  Gastrointestinal: No hematochezia or melena,  No gastroesophageal reflux, no trouble swallowing  Urinary: [ ]  chronic Kidney disease, [ ]  on HD - [ ]  MWF or [ ]  TTHS, [ ]  Burning with urination, [ ]  Frequent urination, [ ]  Difficulty urinating;   Skin: No rashes  Psychological: No history of anxiety,  No history of  depression   Physical Examination  Vitals:   03/10/17 0901  BP: 120/64  Pulse: 61  Resp: 18  Temp: 98.2 F (36.8 C)  SpO2: 98%  Weight: 191 lb (86.6 kg)  Height: 5\' 5"  (1.651 m)    Body mass index is 31.78 kg/m.  General:  Alert and oriented, no acute distress Abdomen: Soft, non-tender, non-distended, no mass Skin: No rash Extremity Pulses:  2+ radial, brachial, femoral, absent dorsalis pedis, posterior tibial pulses bilaterally Musculoskeletal: No deformity or edema  Neurologic: Upper and lower extremity motor 5/5 and symmetric  DATA:  I reviewed her recent CT scan performed as part of a total body bone scan. The patient's abdominal aortic aneurysm is now 5.5 cm in diameter. The aortic neck diameter is 31 mm less than 5 mm from the renal arteries.  He has some iliac tortuosity. No iliac aneurysm.  ASSESSMENT:  Slowly growing juxtarenal abdominal aortic aneurysm. The patient is not a very good candidate for open operative repair due to his overall deconditioning. The aortic neck is short and fairly dilated.   PLAN:  I will make further measurements of the patient's aortic neck diameter for consideration of stent graft repair with either a Cook fenestrated graft or potentially a Dealer. If on preliminary examination he may be a candidate for this and we will schedule him in the near future for a CT Angio with fine cuts. I had a discussion with the patient his wife and his daughter today that I do not feel he is a candidate for open repair. If we are unable to fix this with a stent graft and my plan would be for him to have a follow-up visit in 6 months with repeat ultrasound. If the aneurysm grows to 6 cm in diameter we would need to have further discussions. I did discuss with the patient and his family today the risk of rupture for 5-1/2 cm abdominal aortic aneurysm is about 5% per year.   Ruta Hinds, MD [facility] [place] Office:  [phone] Pager: [phone]

## 2017-03-12 NOTE — Addendum Note (Signed)
Addended by: Lianne Cure A on: 03/12/2017 10:24 AM   Modules accepted: Orders

## 2017-03-18 DIAGNOSIS — Z23 Encounter for immunization: Secondary | ICD-10-CM | POA: Diagnosis not present

## 2017-03-22 ENCOUNTER — Ambulatory Visit (INDEPENDENT_AMBULATORY_CARE_PROVIDER_SITE_OTHER): Payer: Medicare Other | Admitting: Podiatry

## 2017-03-22 ENCOUNTER — Encounter: Payer: Self-pay | Admitting: Podiatry

## 2017-03-22 DIAGNOSIS — E1151 Type 2 diabetes mellitus with diabetic peripheral angiopathy without gangrene: Secondary | ICD-10-CM | POA: Diagnosis not present

## 2017-03-22 DIAGNOSIS — E1142 Type 2 diabetes mellitus with diabetic polyneuropathy: Secondary | ICD-10-CM

## 2017-03-22 DIAGNOSIS — B351 Tinea unguium: Secondary | ICD-10-CM

## 2017-03-22 NOTE — Progress Notes (Signed)
Patient ID: Omar Gomez, male   DOB: 1925-05-22, 83 y.o.   MRN: [id number]   Subjective:  This diabetic patient presents today with his wife present in the treatment room complaining of approximately 3 year history of toenails and gradually become thicker, deformed and are uncomfortable walking wearing shoes. Patient or patient's wife are unable to trim the toenails are request toenail debridement. Patient is diabetic without history of ulceration or claudication Denies burning numbness or pain  History of difficulty hearing with his wife helping to answer questions because of hearing difficulty  Objective: Patient appears pleasant and able to respond to questioning with assist of his wife because of hearing difficulty  Vascular: Bilateral peripheral pitting edema bilaterally DP pulses 1/4 bilaterally PT pulses 0/4 bilaterally Capillary reflex immediate bilaterally  Neurological: Sensation to 10 g monofilament wire intact 3/5 right 2/5 left Vibratory sensation nonreactive bilaterally Ankle reflex reactive bilaterally  Dermatological: No open skin lesions bilaterally Atrophic symptoms absent hair growth bilaterally The toenails elongated, brittle, hypertrophic, discolored 6-10  Musculoskeletal: Patient walks slowly with walker Hammertoe second bilaterally Manual motor testing dorsi flexion, plantar flexion 5/5 bilaterally bunion as bilaterally Assessment: Diabetic peripheral arterial disease in peripheral neuropathy Mycotic toenails 6-10  Plan: At this time because patient has no open lesions are history of claudication will recommend periodic debridement and observation of patient Toenails 6-10 are debrided mechanically and electrically with slight bleeding distal fifth left toe, treated with topical antibiotic ointment and Band-Aid. Patient instructed with Band-Aid 1-3 days and continue to apply topical antibiotic ointment and Band-Aid daily until a scab  forms  Reappoint as 3 months or sooner if patient has a concern

## 2017-03-22 NOTE — Patient Instructions (Signed)
Remove the Band-Aid in the fifth left toe 1-3 days and apply topical antibiotic ointment and a Band-Aid daily until a scab forms  Diabetes and Foot Care Diabetes may cause you to have problems because of poor blood supply (circulation) to your feet and legs. This may cause the skin on your feet to become thinner, break easier, and heal more slowly. Your skin may become dry, and the skin may peel and crack. You may also have nerve damage in your legs and feet causing decreased feeling in them. You may not notice minor injuries to your feet that could lead to infections or more serious problems. Taking care of your feet is one of the most important things you can do for yourself. Follow these instructions at home:  Wear shoes at all times, even in the house. Do not go barefoot. Bare feet are easily injured.  Check your feet daily for blisters, cuts, and redness. If you cannot see the bottom of your feet, use a mirror or ask someone for help.  Wash your feet with warm water (do not use hot water) and mild soap. Then pat your feet and the areas between your toes until they are completely dry. Do not soak your feet as this can dry your skin.  Apply a moisturizing lotion or petroleum jelly (that does not contain alcohol and is unscented) to the skin on your feet and to dry, brittle toenails. Do not apply lotion between your toes.  Trim your toenails straight across. Do not dig under them or around the cuticle. File the edges of your nails with an emery board or nail file.  Do not cut corns or calluses or try to remove them with medicine.  Wear clean socks or stockings every day. Make sure they are not too tight. Do not wear knee-high stockings since they may decrease blood flow to your legs.  Wear shoes that fit properly and have enough cushioning. To break in new shoes, wear them for just a few hours a day. This prevents you from injuring your feet. Always look in your shoes before you put them on  to be sure there are no objects inside.  Do not cross your legs. This may decrease the blood flow to your feet.  If you find a minor scrape, cut, or break in the skin on your feet, keep it and the skin around it clean and dry. These areas may be cleansed with mild soap and water. Do not cleanse the area with peroxide, alcohol, or iodine.  When you remove an adhesive bandage, be sure not to damage the skin around it.  If you have a wound, look at it several times a day to make sure it is healing.  Do not use heating pads or hot water bottles. They may burn your skin. If you have lost feeling in your feet or legs, you may not know it is happening until it is too late.  Make sure your health care provider performs a complete foot exam at least annually or more often if you have foot problems. Report any cuts, sores, or bruises to your health care provider immediately. Contact a health care provider if:  You have an injury that is not healing.  You have cuts or breaks in the skin.  You have an ingrown nail.  You notice redness on your legs or feet.  You feel burning or tingling in your legs or feet.  You have pain or cramps in your legs  and feet.  Your legs or feet are numb.  Your feet always feel cold. Get help right away if:  There is increasing redness, swelling, or pain in or around a wound.  There is a red line that goes up your leg.  Pus is coming from a wound.  You develop a fever or as directed by your health care provider.  You notice a bad smell coming from an ulcer or wound. This information is not intended to replace advice given to you by your health care provider. Make sure you discuss any questions you have with your health care provider. Document Released: 05/15/2000 Document Revised: 10/24/2015 Document Reviewed: 10/25/2012 Elsevier Interactive Patient Education  2017 [employer].

## 2017-03-25 ENCOUNTER — Ambulatory Visit: Payer: Self-pay | Admitting: Vascular Surgery

## 2017-03-25 ENCOUNTER — Other Ambulatory Visit: Payer: Self-pay

## 2017-03-25 ENCOUNTER — Telehealth: Payer: Self-pay | Admitting: Vascular Surgery

## 2017-03-25 ENCOUNTER — Other Ambulatory Visit: Payer: Self-pay | Admitting: *Deleted

## 2017-03-25 DIAGNOSIS — I713 Abdominal aortic aneurysm, ruptured, unspecified: Secondary | ICD-10-CM

## 2017-03-25 DIAGNOSIS — I714 Abdominal aortic aneurysm, without rupture, unspecified: Secondary | ICD-10-CM

## 2017-03-25 NOTE — Telephone Encounter (Signed)
Spoke with wife regarding AAA.  He is not a candidate for open repair.  He is not a candidate for [employer] which is our usual straight forward AAA stent graft.  He may be a candidate for [employer] graft but this would be a more complicated procedure and he would need a more dedicated CT to consider this.  Wife would like to schedule CTA We will do this in the next few weeks  Ruta Hinds, MD [facility] [place]: [phone] Pager: [phone]

## 2017-03-26 ENCOUNTER — Telehealth: Payer: Self-pay | Admitting: Vascular Surgery

## 2017-03-26 NOTE — Telephone Encounter (Signed)
-----   Message from Mena Goes, RN sent at 03/25/2017 11:06 AM EDT ----- Regarding: CTA 61mm cuts, I have placed Order in EPIC, then office appt   ----- Message ----- From: Elam Dutch, MD Sent: 03/25/2017  10:06 AM To: Vvs Charge Pool  Pt needs CTA with runoff for possible Z fen  He needs office appt with me post procedure  I spoke with his wife today  Ruta Hinds

## 2017-03-26 NOTE — Telephone Encounter (Signed)
Sched CTA 04/05/17 at 12:00 at [facility]. Sched MD 04/07/17 at 9:00. Spoke to pt's wife.

## 2017-04-05 ENCOUNTER — Ambulatory Visit
Admission: RE | Admit: 2017-04-05 | Discharge: 2017-04-05 | Disposition: A | Discharge status: Home / Self Care | Payer: Medicare Other | Source: Ambulatory Visit | Attending: Vascular Surgery | Admitting: Vascular Surgery

## 2017-04-05 DIAGNOSIS — I714 Abdominal aortic aneurysm, without rupture, unspecified: Secondary | ICD-10-CM

## 2017-04-05 MED ORDER — IOPAMIDOL (ISOVUE-370) INJECTION 76%
125.0000 mL | Freq: Once | INTRAVENOUS | Status: AC | PRN
  Administered 2017-04-05: 125 mL via INTRAVENOUS

## 2017-04-06 DIAGNOSIS — C61 Malignant neoplasm of prostate: Secondary | ICD-10-CM | POA: Diagnosis not present

## 2017-04-06 DIAGNOSIS — Z5111 Encounter for antineoplastic chemotherapy: Secondary | ICD-10-CM | POA: Diagnosis not present

## 2017-04-07 ENCOUNTER — Encounter: Payer: Self-pay | Admitting: Vascular Surgery

## 2017-04-07 ENCOUNTER — Ambulatory Visit (INDEPENDENT_AMBULATORY_CARE_PROVIDER_SITE_OTHER): Payer: Medicare Other | Admitting: Vascular Surgery

## 2017-04-07 VITALS — BP 117/63 | HR 61 | Temp 97.6°F | Resp 18 | Ht 66.0 in | Wt 189.0 lb

## 2017-04-07 DIAGNOSIS — I714 Abdominal aortic aneurysm, without rupture, unspecified: Secondary | ICD-10-CM

## 2017-04-07 NOTE — Progress Notes (Signed)
      Patient name: Omar Gomez          MRN: [id number]        DOB: Sep 19, 1924          Sex: male   REASON FOR CONSULT: AAA   HPI: Omar Gomez is a 83 y.o. male, for abdominal aortic aneurysm. He returns today after recent CT angiogram with runoff for further evaluation. His AAA was 4.4 cm 2014. It has been slowly growing. He denies any abdominal or back pain. He intermittently has hormone therapy for prostate cancer. He overall is fairly deconditioned and requires a walker to get about. His day consists mostly of moving from one chair to another.  Other medical problems include hyperlipidemia hypertension narcolepsy all of which are been stable. He is on aspirin and a statin.       ROS:    General:  No weight loss, Fever, chills    Cardiac: No recent episodes of chest pain/pressure, no shortness of breath at rest.  + shortness of breath with exertion.  Denies history of atrial fibrillation or irregular heartbeat   Pulmonary: No home oxygen, no productive cough, no hemoptysis,  No asthma or wheezing   Musculoskeletal:  [X]  Arthritis, [X]  Low back pain,  [X]  Joint pain    Urinary: [ ]  chronic Kidney disease, [ ]  on HD - [ ]  MWF or [ ]  TTHS, [ ]  Burning with urination, [ ]  Frequent urination, [ ]  Difficulty urinating;    Skin: No rashes   Psychological: No history of anxiety,  No history of depression     Physical Examination   Vitals:   04/07/17 0908  BP: 117/63  Pulse: 61  Resp: 18  Temp: 97.6 F (36.4 C)  SpO2: 98%  Weight: 189 lb (85.7 kg)  Height: 5\' 6"  (1.676 m)   General:  Alert and oriented, no acute distress Abdomen: Soft, non-tender, non-distended, no mass Skin: No rash Extremity Pulses:  2+ radial, brachial, femoral, absent dorsalis pedis, posterior tibial pulses bilaterally Musculoskeletal: No deformity or edema      Neurologic: Upper and lower extremity motor 5/5 and symmetric   DATA:  I reviewed his recent CTA with 1 mm cuts. The patient's abdominal  aortic aneurysm is 5.8 cm in diameter. The aortic neck diameter is 31 mm less than 5 mm from the renal arteries.  He has some iliac tortuosity. No iliac aneurysm.   ASSESSMENT:  Slowly growing juxtarenal abdominal aortic aneurysm. The patient is not a very good candidate for open operative repair due to his overall deconditioning. The aortic neck is short and fairly dilated.     PLAN:   We will make measurements using chair recon on Monday for consideration of Zenith fenestrated graft for possible stent graft repair. I discussed with the family today risk benefits possible, indications and procedure details of fenestrated stent graft including but not limited to bleeding infection ischemia kidney injury lower extremity vascular injury. Currently they're willing to proceed with stent graft repair if this is an option. We are all in agreement that he is not a candidate for open repair.   Ruta Hinds, MD [facility] Office: [phone] Pager: [phone]

## 2017-05-20 ENCOUNTER — Encounter: Payer: Self-pay | Admitting: Vascular Surgery

## 2017-05-20 ENCOUNTER — Ambulatory Visit: Payer: Medicare Other | Admitting: Vascular Surgery

## 2017-05-20 VITALS — BP 119/68 | HR 66 | Temp 97.9°F | Resp 20 | Ht 66.0 in | Wt 192.2 lb

## 2017-05-20 DIAGNOSIS — I714 Abdominal aortic aneurysm, without rupture, unspecified: Secondary | ICD-10-CM

## 2017-05-20 NOTE — Progress Notes (Signed)
Patient is a 83 year old male who returns for follow-up today regarding further conversations of his abdominal aortic aneurysm.  His current aneurysm diameter is 5.8 cm.  This is by CT scan April 05, 2017.  He denies any abdominal or back pain.  He is fairly sedentary overall and fairly limited in his walking ability due to underlying peripheral arterial disease and arthritis.  However, he has a very good quality of life and enjoys reading and spending time with his family.  I recently made measurements of his aneurysm for consideration for a fenestrated aneurysm stent graft.  He is here today for further discussions regarding this.  Review of systems: He developed shortness of breath with minimal activity.  He denies chest pain.  He denies back pain or abdominal pain.  Past Medical History:  Diagnosis Date  . AAA (abdominal aortic aneurysm) ([place])   . Anal fissure   . Biceps tendon rupture   . Bladder stone   . Blepharitis   . Cancer ([place])    PROSTATE CANCER TREATED WITH RADIATION  . Cataract   . Dermatitis contact, eyelid   . Diabetes mellitus without complication ([place])   . Hyperlipidemia   . Hypertension   . Kidney stones   . Narcolepsy   . OA (osteoarthritis) of knee   . Olecranon bursitis   . Shingles 2011   Past Surgical History:  Procedure Laterality Date  . FACIAL COSMETIC SURGERY     Lesion removed to rule out melanoma  . HERNIA REPAIR     1980    Current Outpatient Medications on File Prior to Visit  Medication Sig Dispense Refill  . acetaminophen (TYLENOL) 500 MG tablet Take 500 mg by mouth every 6 (six) hours as needed. Take 2 tablets every 6 hours.    Marland Kitchen amLODipine (NORVASC) 5 MG tablet Take 5 mg by mouth daily.    Marland Kitchen aspirin 81 MG tablet Take 81 mg by mouth as needed. Pt takes one tablet every two days.    Marland Kitchen atenolol (TENORMIN) 50 MG tablet Take 50 mg by mouth daily. Takes 1/2 tablet daily.    Marland Kitchen atorvastatin (LIPITOR) 80 MG tablet Take 40 mg by mouth daily.     Marland Kitchen  diltiazem 2 % GEL Apply 1 application topically daily.     Marland Kitchen ezetimibe (ZETIA) 10 MG tablet Take 10 mg by mouth daily.    . hydrochlorothiazide (HYDRODIURIL) 25 MG tablet Take 25 mg by mouth daily.    Marland Kitchen Leuprolide Acetate, 6 Month, (LUPRON DEPOT, 5-MONTH,) 45 MG injection Inject 45 mg every 6 (six) months into the muscle.    . metFORMIN (GLUCOPHAGE) 500 MG tablet TK 1 T PO BID WC  6   No current facility-administered medications on file prior to visit.     Physical exam:  Vitals:   05/20/17 1028  BP: 119/68  Pulse: 66  Resp: 20  Temp: 97.9 F (36.6 C)  TempSrc: Oral  SpO2: 97%  Weight: 192 lb 3.2 oz (87.2 kg)  Height: 5\' 6"  (1.676 m)    Abdomen: Soft nontender nondistended slightly obese.  Extremities: 2+ femoral pulses absent popliteal and pedal pulses  Data: I reviewed the patient's CT angiogram from April 05, 2017.  Abdominal aortic aneurysm juxtarenal 5.8 cm diameter.  Assessment: Lengthy discussion with the patient his wife and his daughter today.  I discussed with him risk of rupture of about 5 %/year at the aneurysms current diameter.  I also discussed with him risk benefits possible complications  of procedure details of fenestrated stent graft repair.  Discussed with him that this is a more complicated repair than our usual straightforward aneurysm stent graft.  I had a lengthy discussion with him regarding the possibility that he could have problems with his lower extremities due to sheath indwell time with possible need for fasciotomy for possible need for emergent bypass due to his peripheral arterial disease.  We also had some discussions regarding quality of life and that if he had any significant complication his quality of life would be significantly diminished.  I also discussed with the patient and his family that sometimes these fenestrated stent graft repairs although complicated can be straightforward with quick recovery time but there is no way to predict this.  I  discussed with him a 10% chance of having a problem with his lower extremities.  Plan: At this point they have opted for continued observation until the aneurysm reaches 6-1/2-7 cm in diameter.  We would then reevaluate at that point whether or not we would consider a fenestrated stent graft repair.  We are all in agreement that he is not a candidate for open repair.  We are also all in agreement that if he presents with a rupture of this aneurysm that he would be comfort care.  This was in light of the fact that I discussed with the family that he would not survive repair of a ruptured aneurysm and have any significant quality of life if he did survive the operation.  Ruta Hinds, MD [facility] [place] [place] Office: [phone] Pager: [phone]

## 2017-06-28 ENCOUNTER — Ambulatory Visit (INDEPENDENT_AMBULATORY_CARE_PROVIDER_SITE_OTHER): Payer: Medicare Other | Admitting: Podiatry

## 2017-06-28 ENCOUNTER — Encounter: Payer: Self-pay | Admitting: Podiatry

## 2017-06-28 DIAGNOSIS — M79674 Pain in right toe(s): Secondary | ICD-10-CM

## 2017-06-28 DIAGNOSIS — B351 Tinea unguium: Secondary | ICD-10-CM

## 2017-06-28 DIAGNOSIS — M79675 Pain in left toe(s): Secondary | ICD-10-CM

## 2017-06-30 NOTE — Progress Notes (Signed)
Subjective:   Patient ID: Omar Gomez, male   DOB: 84 y.o.   MRN: [id number]   HPI Patient presents with thick long yellow nailbeds 1-5 both feet that are painful   ROS      Objective:  Physical Exam  Thick yellow brittle nailbeds 1-5 both feet that are painful when palpated     Assessment:  Mycotic nail infection with pain 1-5 both feet     Plan:  Debride painful nailbeds 1-5 both feet with no iatrogenic bleeding noted

## 2017-07-20 DIAGNOSIS — Z0000 Encounter for general adult medical examination without abnormal findings: Secondary | ICD-10-CM | POA: Diagnosis not present

## 2017-07-20 DIAGNOSIS — E1142 Type 2 diabetes mellitus with diabetic polyneuropathy: Secondary | ICD-10-CM | POA: Diagnosis not present

## 2017-07-20 DIAGNOSIS — E1140 Type 2 diabetes mellitus with diabetic neuropathy, unspecified: Secondary | ICD-10-CM | POA: Diagnosis not present

## 2017-07-20 DIAGNOSIS — I10 Essential (primary) hypertension: Secondary | ICD-10-CM | POA: Diagnosis not present

## 2017-09-09 DIAGNOSIS — R351 Nocturia: Secondary | ICD-10-CM | POA: Diagnosis not present

## 2017-09-09 DIAGNOSIS — Z5111 Encounter for antineoplastic chemotherapy: Secondary | ICD-10-CM | POA: Diagnosis not present

## 2017-09-09 DIAGNOSIS — C61 Malignant neoplasm of prostate: Secondary | ICD-10-CM | POA: Diagnosis not present

## 2017-09-16 ENCOUNTER — Other Ambulatory Visit (HOSPITAL_COMMUNITY): Payer: Self-pay

## 2017-09-16 ENCOUNTER — Ambulatory Visit: Payer: Self-pay | Admitting: Vascular Surgery

## 2017-09-23 DIAGNOSIS — H35371 Puckering of macula, right eye: Secondary | ICD-10-CM | POA: Diagnosis not present

## 2017-09-23 DIAGNOSIS — H40013 Open angle with borderline findings, low risk, bilateral: Secondary | ICD-10-CM | POA: Diagnosis not present

## 2017-09-23 DIAGNOSIS — E119 Type 2 diabetes mellitus without complications: Secondary | ICD-10-CM | POA: Diagnosis not present

## 2017-09-23 DIAGNOSIS — H35033 Hypertensive retinopathy, bilateral: Secondary | ICD-10-CM | POA: Diagnosis not present

## 2017-09-28 DIAGNOSIS — M179 Osteoarthritis of knee, unspecified: Secondary | ICD-10-CM | POA: Diagnosis not present

## 2017-09-28 DIAGNOSIS — E1140 Type 2 diabetes mellitus with diabetic neuropathy, unspecified: Secondary | ICD-10-CM | POA: Diagnosis not present

## 2017-09-28 DIAGNOSIS — N183 Chronic kidney disease, stage 3 (moderate): Secondary | ICD-10-CM | POA: Diagnosis not present

## 2017-09-28 DIAGNOSIS — I10 Essential (primary) hypertension: Secondary | ICD-10-CM | POA: Diagnosis not present

## 2017-09-29 ENCOUNTER — Other Ambulatory Visit: Payer: Medicare Other

## 2017-10-15 ENCOUNTER — Encounter: Payer: Self-pay | Admitting: Podiatry

## 2017-10-15 ENCOUNTER — Ambulatory Visit (INDEPENDENT_AMBULATORY_CARE_PROVIDER_SITE_OTHER): Payer: Medicare Other | Admitting: Podiatry

## 2017-10-15 DIAGNOSIS — B351 Tinea unguium: Secondary | ICD-10-CM

## 2017-10-15 DIAGNOSIS — M79675 Pain in left toe(s): Secondary | ICD-10-CM | POA: Diagnosis not present

## 2017-10-15 DIAGNOSIS — M79674 Pain in right toe(s): Secondary | ICD-10-CM | POA: Diagnosis not present

## 2017-10-17 NOTE — Progress Notes (Signed)
Subjective:   Patient ID: Omar Gomez, male   DOB: 84 y.o.   MRN: [id number]   HPI Patient presents with thick nailbeds 1-5 both feet that are incurvated and he cannot cut himself   ROS      Objective:  Physical Exam  Neurovascular status intact with thick yellow brittle nailbeds 1-5 both feet with pain     Assessment:  Chronic mycotic nail infection with pain 1-5 both feet     Plan:  Debride painful nailbeds 1-5 both feet with no iatrogenic bleeding noted

## 2017-11-18 ENCOUNTER — Encounter: Payer: Self-pay | Admitting: Vascular Surgery

## 2017-11-18 ENCOUNTER — Ambulatory Visit: Payer: Self-pay | Admitting: Vascular Surgery

## 2017-11-18 ENCOUNTER — Ambulatory Visit (INDEPENDENT_AMBULATORY_CARE_PROVIDER_SITE_OTHER): Payer: Medicare Other | Admitting: Vascular Surgery

## 2017-11-18 ENCOUNTER — Ambulatory Visit (HOSPITAL_COMMUNITY)
Admission: RE | Admit: 2017-11-18 | Discharge: 2017-11-18 | Disposition: A | Discharge status: Home / Self Care | Payer: Medicare Other | Source: Ambulatory Visit | Attending: Vascular Surgery | Admitting: Vascular Surgery

## 2017-11-18 ENCOUNTER — Other Ambulatory Visit: Payer: Self-pay

## 2017-11-18 VITALS — BP 107/66 | HR 70 | Temp 98.3°F | Resp 16 | Ht 66.0 in | Wt 184.0 lb

## 2017-11-18 DIAGNOSIS — I714 Abdominal aortic aneurysm, without rupture, unspecified: Secondary | ICD-10-CM

## 2017-11-18 NOTE — Progress Notes (Signed)
Patient is a 84 year old male who returns for follow-up today regarding abdominal aortic aneurysm.  He was last seen December 2018.  His aneurysm diameter at that time was 5.8 cm by CT scan from November 2018.  He continues to deny any abdominal or back pain.  He is fairly sedentary overall and limited in his walking ability due to peripheral neuropathy and arthritis.  His family states that he has not really had any decline and may have improved slightly.  He has a good quality of life and enjoys reading and spending time with his family.  Previous measurements were made for consideration for fenestrated aneurysm stent graft but we have deferred this for now until the aneurysm grows to greater than 6 cm and then decide on further evaluation.  Review of systems: He developed shortness of breath with minimal exertion.  He denies chest pain.  Chronic medical problems remain diabetes hyperlipidemia hypertension all of which are currently stable  Past Medical History:  Diagnosis Date  . AAA (abdominal aortic aneurysm) ([place])   . Anal fissure   . Biceps tendon rupture   . Bladder stone   . Blepharitis   . Cancer ([place])    PROSTATE CANCER TREATED WITH RADIATION  . Cataract   . Dermatitis contact, eyelid   . Diabetes mellitus without complication ([place])   . Hyperlipidemia   . Hypertension   . Kidney stones   . Narcolepsy   . OA (osteoarthritis) of knee   . Olecranon bursitis   . Shingles 2011    Physical exam:  Vitals:   11/18/17 0848  BP: 107/66  Pulse: 70  Resp: 16  Temp: 98.3 F (36.8 C)  TempSrc: Oral  SpO2: 98%  Weight: 184 lb (83.5 kg)  Height: 5\' 6"  (1.676 m)    Abdomen: Soft nontender nondistended no obvious pulsatile mass 2+ femoral pulses bilaterally  Chest: Clear to auscultation bilaterally  Cardiac: Regular rate and rhythm without murmur  Data: Patient had an aortic duplex today.  This showed an aortic diameter of 5.6 cm.  Assessment: 5.6 cm abdominal aortic  aneurysm.  Measurement has been as large as 5.8 cm in the past.  He is currently asymptomatic.  I discussed again today risk of rupture with the family of about 5 %/year.  They are still in agreement that we would not consider repair of this aneurysm if he ruptured.  We are also still in agreement that we would reconsider whether or not it is worthwhile to repair the aneurysm if it grows to more than 6 to 6-1/2 cm in diameter at which time the risk of rupture would start to increase more.  He will follow-up with [place] in 6 months for repeat aortic duplex.  He will see our nurse practitioner at that time.  If his aortic diameter is greater than 6 cm we will reconsider whether or not to consider a fenestrated stent graft repair.  Ruta Hinds, MD [facility] Office: [phone] Pager: [phone]

## 2017-11-19 ENCOUNTER — Other Ambulatory Visit: Payer: Self-pay

## 2017-11-19 DIAGNOSIS — C61 Malignant neoplasm of prostate: Secondary | ICD-10-CM | POA: Diagnosis not present

## 2017-11-19 DIAGNOSIS — I714 Abdominal aortic aneurysm, without rupture, unspecified: Secondary | ICD-10-CM

## 2017-11-19 DIAGNOSIS — E1122 Type 2 diabetes mellitus with diabetic chronic kidney disease: Secondary | ICD-10-CM | POA: Diagnosis not present

## 2017-11-19 DIAGNOSIS — I10 Essential (primary) hypertension: Secondary | ICD-10-CM | POA: Diagnosis not present

## 2017-11-19 DIAGNOSIS — E1142 Type 2 diabetes mellitus with diabetic polyneuropathy: Secondary | ICD-10-CM | POA: Diagnosis not present

## 2018-01-21 ENCOUNTER — Ambulatory Visit (INDEPENDENT_AMBULATORY_CARE_PROVIDER_SITE_OTHER): Payer: Medicare Other | Admitting: Podiatry

## 2018-01-21 ENCOUNTER — Encounter: Payer: Self-pay | Admitting: Podiatry

## 2018-01-21 DIAGNOSIS — E1151 Type 2 diabetes mellitus with diabetic peripheral angiopathy without gangrene: Secondary | ICD-10-CM | POA: Diagnosis not present

## 2018-01-21 DIAGNOSIS — B351 Tinea unguium: Secondary | ICD-10-CM | POA: Diagnosis not present

## 2018-01-21 DIAGNOSIS — M79674 Pain in right toe(s): Secondary | ICD-10-CM

## 2018-01-21 DIAGNOSIS — M79675 Pain in left toe(s): Secondary | ICD-10-CM

## 2018-01-21 DIAGNOSIS — E1142 Type 2 diabetes mellitus with diabetic polyneuropathy: Secondary | ICD-10-CM

## 2018-01-24 NOTE — Progress Notes (Signed)
Subjective: Omar Gomez  Is accompanied by his wife today.  He presents today with diabetes, diabetic neuropathy and cc of painful, discolored, thick toenails which interfere with daily activities and routine tasks. Pain is aggravated when wearing enclosed shoe gear. Pain is relieved with periodic professional debridement.  Objective: Vascular Examination: Capillary refill time immediate x 10 digits Dorsalis pedis pulses 1/4 b/l Posterior tibial pulses absent b/l No digital hair x 10 digits Skin temperature warm to warm b/l  Dermatological Examination: No open skin lesions bilaterally Skin thin, shiny and atrophic b/l Toenails 1-5 b/l discolored, thick, dystrophic with subungual debris and pain with palpation to nailbeds due to thickness of nails.  Musculoskeletal: Hammertoe 2nd digit b/l HAV b/l Muscle strength 5/5 to all LE muscle groups  Neurological: Sensation diminished with 10 gram monofilament. Vibratory sensation diminished  Assessment: 1. Painful onychomycosis toenails 1-5 b/l 2. NIDDM with Peripheral arterial disease 3. Diabetic neuropathy  Plan: 1.  Toenails 1-5 b/l were debrided in length and girth without iatrogenic bleeding. 2. Patient to continue soft, supportive shoe gear 3. POA to report any pedal injuries to medical professional  4. Follow up 3 months.  5. POA to call should there be a concern in the interim.

## 2018-03-03 DIAGNOSIS — C61 Malignant neoplasm of prostate: Secondary | ICD-10-CM | POA: Diagnosis not present

## 2018-03-10 DIAGNOSIS — R351 Nocturia: Secondary | ICD-10-CM | POA: Diagnosis not present

## 2018-03-10 DIAGNOSIS — C61 Malignant neoplasm of prostate: Secondary | ICD-10-CM | POA: Diagnosis not present

## 2018-04-07 DIAGNOSIS — C61 Malignant neoplasm of prostate: Secondary | ICD-10-CM | POA: Diagnosis not present

## 2018-04-07 DIAGNOSIS — E1140 Type 2 diabetes mellitus with diabetic neuropathy, unspecified: Secondary | ICD-10-CM | POA: Diagnosis not present

## 2018-04-07 DIAGNOSIS — I10 Essential (primary) hypertension: Secondary | ICD-10-CM | POA: Diagnosis not present

## 2018-04-07 DIAGNOSIS — I739 Peripheral vascular disease, unspecified: Secondary | ICD-10-CM | POA: Diagnosis not present

## 2018-04-21 ENCOUNTER — Ambulatory Visit (INDEPENDENT_AMBULATORY_CARE_PROVIDER_SITE_OTHER): Payer: Medicare Other | Admitting: Podiatry

## 2018-04-21 DIAGNOSIS — M79675 Pain in left toe(s): Secondary | ICD-10-CM

## 2018-04-21 DIAGNOSIS — B351 Tinea unguium: Secondary | ICD-10-CM | POA: Diagnosis not present

## 2018-04-21 DIAGNOSIS — E1142 Type 2 diabetes mellitus with diabetic polyneuropathy: Secondary | ICD-10-CM

## 2018-04-21 DIAGNOSIS — E1151 Type 2 diabetes mellitus with diabetic peripheral angiopathy without gangrene: Secondary | ICD-10-CM

## 2018-04-21 DIAGNOSIS — M79674 Pain in right toe(s): Secondary | ICD-10-CM | POA: Diagnosis not present

## 2018-04-21 NOTE — Patient Instructions (Signed)

## 2018-05-09 ENCOUNTER — Encounter: Payer: Self-pay | Admitting: Podiatry

## 2018-05-09 NOTE — Progress Notes (Signed)
Subjective: Omar Gomez presents today with diabetes, diabetic neuropathy and cc of painful, discolored, thick toenails which interfere with daily activities and routine tasks.  Pain is aggravated when wearing enclosed shoe gear. Pain is getting progressively worse and relieved with periodic professional debridement.   Current Outpatient Medications:  .  acetaminophen (TYLENOL) 500 MG tablet, Take 500 mg by mouth every 6 (six) hours as needed. Take 2 tablets every 6 hours., Disp: , Rfl:  .  amLODipine (NORVASC) 5 MG tablet, Take 5 mg by mouth daily., Disp: , Rfl:  .  aspirin 81 MG tablet, Take 81 mg by mouth as needed. Pt takes one tablet every two days., Disp: , Rfl:  .  atenolol (TENORMIN) 50 MG tablet, Take 50 mg by mouth daily. Takes 1/2 tablet daily., Disp: , Rfl:  .  atorvastatin (LIPITOR) 80 MG tablet, Take 40 mg by mouth daily. , Disp: , Rfl:  .  diltiazem 2 % GEL, Apply 1 application topically daily. , Disp: , Rfl:  .  ezetimibe (ZETIA) 10 MG tablet, Take 10 mg by mouth daily., Disp: , Rfl:  .  hydrochlorothiazide (HYDRODIURIL) 25 MG tablet, Take 25 mg by mouth daily., Disp: , Rfl:  .  Leuprolide Acetate, 6 Month, (LUPRON DEPOT, 49-MONTH,) 45 MG injection, Inject 45 mg every 6 (six) months into the muscle., Disp: , Rfl:  .  metFORMIN (GLUCOPHAGE) 500 MG tablet, TK 1 T PO BID WC, Disp: , Rfl: 6  No Known Allergies  Objective:  Vascular Examination: Capillary refill time immediate x 10 digits Dorsalis pedis pulses 1/4 b/l Posterior tibial pulses absent b/l No digital hair x 10 digits Skin temperature gradient WNL b/l  Dermatological Examination: Skin thin, shiny and atrophic b/l  No open wounds b/l  Toenails 1-5 b/l discolored, thick, dystrophic with subungual debris and pain with palpation to nailbeds due to thickness of nails.    Musculoskeletal: Muscle strength 5/5 to all LE muscle groups  Hammertoe 2nd digit b/l  HAV b/l  Neurological: Sensation diminished  with 10 gram monofilament. Vibratory sensation diminished  Assessment: 1. Painful onychomycosis toenails 1-5 b/l 2. NIDDM with Peripheral arterial disease 3. Diabetic neuropathy  Plan: 1. Discuss diabetic foot care principles. Literature dispensed. 2. Toenails 1-5 b/l were debrided in length and girth without iatrogenic bleeding. 3. Patient to continue soft, supportive shoe gear 4. Patient to report any pedal injuries to medical professional  5. Follow up 3 months. Patient/POA to call should there be a concern in the interim.

## 2018-05-20 ENCOUNTER — Ambulatory Visit: Payer: Medicare Other | Admitting: Family

## 2018-05-20 ENCOUNTER — Ambulatory Visit (HOSPITAL_COMMUNITY)
Admission: RE | Admit: 2018-05-20 | Discharge: 2018-05-20 | Disposition: A | Discharge status: Home / Self Care | Payer: Medicare Other | Source: Ambulatory Visit | Attending: Vascular Surgery | Admitting: Vascular Surgery

## 2018-05-20 ENCOUNTER — Encounter: Payer: Self-pay | Admitting: Family

## 2018-05-20 VITALS — BP 123/75 | HR 88 | Temp 97.2°F | Resp 14 | Ht 66.0 in | Wt 194.0 lb

## 2018-05-20 DIAGNOSIS — I714 Abdominal aortic aneurysm, without rupture, unspecified: Secondary | ICD-10-CM

## 2018-05-20 NOTE — Patient Instructions (Addendum)
Before your next abdominal ultrasound:  Avoid gas forming foods and beverages the day before the test.   Take two Extra-Strength Gas-X capsules at bedtime the night before the test. Take another two Extra-Strength Gas-X capsules in the middle of the night if you get up to the restroom, if not, first thing in the morning with water.  Do not chew gum.      Abdominal Aortic Aneurysm  An aneurysm is a bulge in one of the blood vessels that carry blood away from the heart (artery). It happens when blood pushes up against a weak or damaged place in the wall of an artery. An abdominal aortic aneurysm happens in the main artery of the body (aorta). Some aneurysms may not cause problems. If it grows, it can burst or tear, causing bleeding inside the body. This is an emergency. It needs to be treated right away. What are the causes? The exact cause of this condition is not known. What increases the risk? The following may make you more likely to get this condition:  Being a male who is 82 years of age or older.  Being white (Caucasian).  Using tobacco.  Having a family history of aneurysms.  Having the following conditions: ? Hardening of the arteries (arteriosclerosis). ? Inflammation of the walls of an artery (arteritis). ? Certain genetic conditions. ? Being very overweight (obesity). ? An infection in the wall of the aorta (infectious aortitis). ? High cholesterol. ? High blood pressure (hypertension). What are the signs or symptoms? Symptoms depend on the size of the aneurysm and how fast it is growing. Most grow slowly and do not cause any symptoms. If symptoms do occur, they may include:  Pain in the belly (abdomen), side, or back.  Feeling full after eating only small amounts of food.  Feeling a throbbing lump in the belly. Symptoms that the aneurysm has burst (ruptured) include:  Sudden, very bad pain in the belly, side, or back.  Feeling sick to your stomach  (nauseous).  Throwing up (vomiting).  Feeling light-headed or passing out. How is this treated? Treatment for this condition depends on:  The size of the aneurysm.  How fast it is growing.  Your age.  Your risk of having it burst. If your aneurysm is smaller than 2 inches (5 cm), your doctor may manage it by:  Checking it often to see if it is getting bigger. You may have an imaging test (ultrasound) to check it every 3-6 months, every year, or every few years.  Giving you medicines to: ? Control blood pressure. ? Treat pain. ? Fight infection. If your aneurysm is larger than 2 inches (5 cm), you may need surgery to fix it. Follow these instructions at home: Lifestyle  Do not use any products that have nicotine or tobacco in them. This includes cigarettes, e-cigarettes, and chewing tobacco. If you need help quitting, ask your doctor.  Get regular exercise. Ask your doctor what types of exercise are best for you. Eating and drinking  Eat a heart-healthy diet. This includes eating plenty of: ? Fresh fruits and vegetables. ? Whole grains. ? Low-fat (lean) protein. ? Low-fat dairy products.  Avoid foods that are high in saturated fat and cholesterol. These foods include red meat and some dairy products.  Do not drink alcohol if: ? Your doctor tells you not to drink. ? You are pregnant, may be pregnant, or are planning to become pregnant.  If you drink alcohol: ? Limit how much you  use to:  0-1 drink a day for women.  0-2 drinks a day for men. ? Be aware of how much alcohol is in your drink. In the [place] one drink equals any of these:  One typical bottle of beer (12 oz).  One-half glass of wine (5 oz).  One shot of hard liquor (1 oz). General instructions  Take over-the-counter and prescription medicines only as told by your doctor.  Keep your blood pressure within normal limits. Ask your doctor what your blood pressure should be.  Have your blood sugar  (glucose) level and cholesterol levels checked regularly. Keep your blood sugar level and cholesterol levels within normal limits.  Avoid heavy lifting and activities that take a lot of effort. Ask your doctor what activities are safe for you.  Keep all follow-up visits as told by your doctor. This is important. ? Talk to your doctor about regular screenings to see if the aneurysm is getting bigger. Contact a doctor if you:  Have pain in your belly, side, or back.  Have a throbbing feeling in your belly.  Have a family history of aneurysms. Get help right away if you:  Have sudden, bad pain in your belly, side, or back.  Feel sick to your stomach.  Throw up.  Have trouble pooping (constipation).  Have trouble peeing (urinating).  Feel light-headed.  Have a fast heart rate when you stand.  Have sweaty skin that is cold to the touch (clammy).  Have shortness of breath.  Have a fever. These symptoms may be an emergency. Do not wait to see if the symptoms will go away. Get medical help right away. Call your local emergency services (911 in the [place] Do not drive yourself to the hospital. Summary  An aneurysm is a bulge in one of the blood vessels that carry blood away from the heart (artery). Some aneurysms may not cause problems.  You may need to have yours checked often. If it grows, it can burst or tear. This causes bleeding inside the body. It needs to be treated right away.  Follow instructions from your doctor about healthy lifestyle changes.  Keep all follow-up visits as told by your doctor. This is important. This information is not intended to replace advice given to you by your health care provider. Make sure you discuss any questions you have with your health care provider. Document Released: 09/12/2012 Document Revised: 12/25/2017 Document Reviewed: 12/25/2017 Elsevier Interactive Patient Education  2019 [employer].  Abdominal Aortic Aneurysm  An aneurysm  is a bulge in one of the blood vessels that carry blood away from the heart (artery). It happens when blood pushes up against a weak or damaged place in the wall of an artery. An abdominal aortic aneurysm happens in the main artery of the body (aorta). Some aneurysms may not cause problems. If it grows, it can burst or tear, causing bleeding inside the body. This is an emergency. It needs to be treated right away. What are the causes? The exact cause of this condition is not known. What increases the risk? The following may make you more likely to get this condition:  Being a male who is 36 years of age or older.  Being white (Caucasian).  Using tobacco.  Having a family history of aneurysms.  Having the following conditions: ? Hardening of the arteries (arteriosclerosis). ? Inflammation of the walls of an artery (arteritis). ? Certain genetic conditions. ? Being very overweight (obesity). ? An infection in the wall  of the aorta (infectious aortitis). ? High cholesterol. ? High blood pressure (hypertension). What are the signs or symptoms? Symptoms depend on the size of the aneurysm and how fast it is growing. Most grow slowly and do not cause any symptoms. If symptoms do occur, they may include:  Pain in the belly (abdomen), side, or back.  Feeling full after eating only small amounts of food.  Feeling a throbbing lump in the belly. Symptoms that the aneurysm has burst (ruptured) include:  Sudden, very bad pain in the belly, side, or back.  Feeling sick to your stomach (nauseous).  Throwing up (vomiting).  Feeling light-headed or passing out. How is this treated? Treatment for this condition depends on:  The size of the aneurysm.  How fast it is growing.  Your age.  Your risk of having it burst. If your aneurysm is smaller than 2 inches (5 cm), your doctor may manage it by:  Checking it often to see if it is getting bigger. You may have an imaging test  (ultrasound) to check it every 3-6 months, every year, or every few years.  Giving you medicines to: ? Control blood pressure. ? Treat pain. ? Fight infection. If your aneurysm is larger than 2 inches (5 cm), you may need surgery to fix it. Follow these instructions at home: Lifestyle  Do not use any products that have nicotine or tobacco in them. This includes cigarettes, e-cigarettes, and chewing tobacco. If you need help quitting, ask your doctor.  Get regular exercise. Ask your doctor what types of exercise are best for you. Eating and drinking  Eat a heart-healthy diet. This includes eating plenty of: ? Fresh fruits and vegetables. ? Whole grains. ? Low-fat (lean) protein. ? Low-fat dairy products.  Avoid foods that are high in saturated fat and cholesterol. These foods include red meat and some dairy products.  Do not drink alcohol if: ? Your doctor tells you not to drink. ? You are pregnant, may be pregnant, or are planning to become pregnant.  If you drink alcohol: ? Limit how much you use to:  0-1 drink a day for women.  0-2 drinks a day for men. ? Be aware of how much alcohol is in your drink. In the [place] one drink equals any of these:  One typical bottle of beer (12 oz).  One-half glass of wine (5 oz).  One shot of hard liquor (1 oz). General instructions  Take over-the-counter and prescription medicines only as told by your doctor.  Keep your blood pressure within normal limits. Ask your doctor what your blood pressure should be.  Have your blood sugar (glucose) level and cholesterol levels checked regularly. Keep your blood sugar level and cholesterol levels within normal limits.  Avoid heavy lifting and activities that take a lot of effort. Ask your doctor what activities are safe for you.  Keep all follow-up visits as told by your doctor. This is important. ? Talk to your doctor about regular screenings to see if the aneurysm is getting  bigger. Contact a doctor if you:  Have pain in your belly, side, or back.  Have a throbbing feeling in your belly.  Have a family history of aneurysms. Get help right away if you:  Have sudden, bad pain in your belly, side, or back.  Feel sick to your stomach.  Throw up.  Have trouble pooping (constipation).  Have trouble peeing (urinating).  Feel light-headed.  Have a fast heart rate when you stand.  Have sweaty skin that is cold to the touch (clammy).  Have shortness of breath.  Have a fever. These symptoms may be an emergency. Do not wait to see if the symptoms will go away. Get medical help right away. Call your local emergency services (911 in the [place] Do not drive yourself to the hospital. Summary  An aneurysm is a bulge in one of the blood vessels that carry blood away from the heart (artery). Some aneurysms may not cause problems.  You may need to have yours checked often. If it grows, it can burst or tear. This causes bleeding inside the body. It needs to be treated right away.  Follow instructions from your doctor about healthy lifestyle changes.  Keep all follow-up visits as told by your doctor. This is important. This information is not intended to replace advice given to you by your health care provider. Make sure you discuss any questions you have with your health care provider. Document Released: 09/12/2012 Document Revised: 12/25/2017 Document Reviewed: 12/25/2017 Elsevier Interactive Patient Education  2019 [employer].

## 2018-05-20 NOTE — Progress Notes (Signed)
[facility] [place]   CC: Follow up Abdominal Aortic Aneurysm  History of Present Illness  Omar Gomez is a 84 y.o. (1924/10/12) male whom Dr. Oneida Gomez has been monitoring for abdominal aortic aneurysm. Ultrasound in 2011 showed the aneurysm was 4.2 cm in diameter. He continues to deny any abdominal pain. He has a history of chronic back pain, states he was told that he has arthritis in his back and knee. However, since his visit on 11-18-17 and today (05-20-18), his back pain has resolved.   He lives at home. He has a good quality of life and enjoys reading and spending time with his family.  Previous measurements were made for consideration for fenestrated aneurysm stent graft but we have deferred this for until the aneurysm grows to greater than 6 cm and then decide on further evaluation.   Dr. Oneida Gomez last evaluated pt on 11-18-17. At that time abdominal duplex demonstrated a 5.6 cm abdominal aortic aneurysm.  Measurement has been as large as 5.8 cm in the past.  He was asymptomatic. Dr. Oneida Gomez discussed with pt and family again that day the risk of rupture of about 5 %/year.  They were still in agreement that we would not consider repair of this aneurysm if he ruptured.  Family and Dr. Oneida Gomez were also still in agreement that we would reconsider whether or not it is worthwhile to repair the aneurysm if it grows to more than 6 to 6-1/2 cm in diameter at which time the risk of rupture would start to increase more. He was to follow-up with [place] in 6 months for repeat aortic duplex and see our nurse practitioner at that time.  If his aortic diameter is greater than 6 cm we will reconsider whether or not to consider a fenestrated stent graft repair.  The patient does not seem to have claudication symptoms as he cannot walk much due to significant bilateral knee pain. The patient denies history of stroke or TIA symptoms.  Wife states he stopped using his stationary bike.    Diabetic: Yes, states is borderline, on metformin, no A1C result on file Tobacco use: former smoker, quit in the 1960's    Past Medical History:  Diagnosis Date  . AAA (abdominal aortic aneurysm) ([place])   . Anal fissure   . Biceps tendon rupture   . Bladder stone   . Blepharitis   . Cancer ([place])    PROSTATE CANCER TREATED WITH RADIATION  . Cataract   . Dermatitis contact, eyelid   . Diabetes mellitus without complication ([place])   . Hyperlipidemia   . Hypertension   . Kidney stones   . Narcolepsy   . OA (osteoarthritis) of knee   . Olecranon bursitis   . Shingles 2011   Past Surgical History:  Procedure Laterality Date  . FACIAL COSMETIC SURGERY     Lesion removed to rule out melanoma  . HERNIA REPAIR     1980   Social History Social History   Socioeconomic History  . Marital status: Married    Spouse name: Not on file  . Number of children: Not on file  . Years of education: Not on file  . Highest education level: Not on file  Occupational History  . Not on file  Social Needs  . Financial resource strain: Not on file  . Food insecurity:    Worry: Not on file    Inability: Not on file  . Transportation needs:    Medical: Not  on file    Non-medical: Not on file  Tobacco Use  . Smoking status: Former Smoker    Last attempt to quit: 06/02/1963    Years since quitting: 55.0  . Smokeless tobacco: Never Used  Substance and Sexual Activity  . Alcohol use: No  . Drug use: No  . Sexual activity: Not on file  Lifestyle  . Physical activity:    Days per week: Not on file    Minutes per session: Not on file  . Stress: Not on file  Relationships  . Social connections:    Talks on phone: Not on file    Gets together: Not on file    Attends religious service: Not on file    Active member of club or organization: Not on file    Attends meetings of clubs or organizations: Not on file    Relationship status: Not on file  . Intimate partner violence:    Fear of  current or ex partner: Not on file    Emotionally abused: Not on file    Physically abused: Not on file    Forced sexual activity: Not on file  Other Topics Concern  . Not on file  Social History Narrative  . Not on file   Family History Family History  Problem Relation Age of Onset  . Alcohol abuse Brother   . Heart disease Brother     Current Outpatient Medications on File Prior to Visit  Medication Sig Dispense Refill  . acetaminophen (TYLENOL) 500 MG tablet Take 500 mg by mouth every 6 (six) hours as needed. Take 2 tablets every 6 hours.    Marland Kitchen amLODipine (NORVASC) 5 MG tablet Take 5 mg by mouth daily.    Marland Kitchen aspirin 81 MG tablet Take 81 mg by mouth as needed. Pt takes one tablet every two days.    Marland Kitchen atenolol (TENORMIN) 50 MG tablet Take 50 mg by mouth daily. Takes 1/2 tablet daily.    Marland Kitchen atorvastatin (LIPITOR) 80 MG tablet Take 40 mg by mouth daily.     Marland Kitchen diltiazem 2 % GEL Apply 1 application topically daily.     Marland Kitchen ezetimibe (ZETIA) 10 MG tablet Take 10 mg by mouth daily.    . hydrochlorothiazide (HYDRODIURIL) 25 MG tablet Take 25 mg by mouth daily.    Marland Kitchen Leuprolide Acetate, 6 Month, (LUPRON DEPOT, 42-MONTH,) 45 MG injection Inject 45 mg every 6 (six) months into the muscle.    . metFORMIN (GLUCOPHAGE) 500 MG tablet TK 1 T PO BID WC  6   No current facility-administered medications on file prior to visit.    No Known Allergies  ROS: See HPI for pertinent positives and negatives.  Physical Examination  Vitals:   05/20/18 0940  BP: 123/75  Pulse: 88  Resp: 14  Temp: (!) 97.2 F (36.2 C)  SpO2: 97%  Weight: 194 lb (88 kg)  Height: 5\' 6"  (1.676 m)   Body mass index is 31.31 kg/m.  General: A&O x 3, WD, elderly obese male. HEENT: Grossly intact and WNL, brown stained teeth.  Pulmonary: Sym exp, respirations are non labored, good air movement in all fields, CTAB, no rales, rhonchi, or wheezing. Cardiac: Regular rhythm and rate, no detected murmur.  Carotid Bruits Right  Left   Negative Negative   Adominal aortic pulse is not palpable Radial pulses are 2+ palpable bilaterally  VASCULAR EXAM:                                                                                                         LE Pulses Right Left       FEMORAL  not palpable seated in chair  not palpable        POPLITEAL  not palpable   not palpable       POSTERIOR TIBIAL  not palpable   not palpable        DORSALIS PEDIS      ANTERIOR TIBIAL faintly palpable  faintly palpable     Gastrointestinal: softly obese, NTND, -G/R, - HSM, - masses palpated, - CVAT B. Musculoskeletal: M/S 4/5 throughout , Extremities without ischemic changes. Feet and toes are pink and warm.  Skin: No rashes, no ulcers, no cellulitis.   Neurologic: CN 2-12 intact except has significant hearing loss, Pain and light touch intact in extremities are intact, Motor exam as listed above. Psychiatric: Normal thought content, mood appropriate to clinical situation.    DATA  Abdominal Aorta Duplex Findings (05-20-18): +-----------+-------+----------+----------+--------+--------+--------+ Location   AP (cm)Trans (cm)PSV (cm/s)WaveformThrombusComments +-----------+-------+----------+----------+--------+--------+--------+ Proximal   2.70   2.93                                         +-----------+-------+----------+----------+--------+--------+--------+ Mid        3.23   3.12                                         +-----------+-------+----------+----------+--------+--------+--------+ Distal     5.31   5.42                                         +-----------+-------+----------+----------+--------+--------+--------+ RT CIA Prox1.6    1.9       75                                 +-----------+-------+----------+----------+--------+--------+--------+ LT CIA Prox1.8    1.9                                           +-----------+-------+----------+----------+--------+--------+--------+  Visualization of the Mid Abdominal Aorta, Distal Abdominal Aorta and Left CIA Proximal artery was limited.   Summary: Abdominal Aorta: There is evidence of abnormal dilitation of the Distal Abdominal aorta. The largest aortic measurement is 5.4 cm. The largest aortic diameter remains essentially unchanged compared to prior exam. Previous diameter measurement was 5.6 cm obtained on 11/18/2017.    Medical Decision Making  The patient is a 84 y.o. male who presents with asymptomatic AAA with no  increase in size at 5.4 cm, based on limited visualization due to overlying bowel gas. Wife and daughter state they did not know pt was supposed to take Extra strength Gas-X. I discussed with pt and family how to prepare for the next AAA duplex in 6 months to minimize bowel gas.    Based on this patient's exam and diagnostic studies, the patient will follow up in 6 months with the following studies: AAA duplex.  Consideration for repair of AAA would be made when the size is 5.0 cm, growth > 1 cm/yr, and symptomatic status.        The patient was given information about AAA including signs, symptoms, treatment,and how to minimize the risk of enlargement and rupture of aneurysms.    I emphasized the importance of maximal medical management including strict control of blood pressure, blood glucose, and lipid levels, antiplatelet agents, obtaining regular exercise, and continued cessation of smoking.   The patient was advised to call 911 should the patient experience sudden onset abdominal or back pain.   Thank you for allowing [place] to participate in this patient's care.  Clemon Chambers, RN, MSN, FNP-C [facility] Office: [phone]  Clinic Physician: Donzetta Matters  05/20/2018, 9:56 AM

## 2018-06-14 DIAGNOSIS — R531 Weakness: Secondary | ICD-10-CM | POA: Diagnosis not present

## 2018-07-06 ENCOUNTER — Ambulatory Visit: Payer: Self-pay | Admitting: Neurology

## 2018-07-26 DIAGNOSIS — I739 Peripheral vascular disease, unspecified: Secondary | ICD-10-CM | POA: Diagnosis not present

## 2018-07-26 DIAGNOSIS — Z1389 Encounter for screening for other disorder: Secondary | ICD-10-CM | POA: Diagnosis not present

## 2018-07-26 DIAGNOSIS — Z0000 Encounter for general adult medical examination without abnormal findings: Secondary | ICD-10-CM | POA: Diagnosis not present

## 2018-07-26 DIAGNOSIS — E1142 Type 2 diabetes mellitus with diabetic polyneuropathy: Secondary | ICD-10-CM | POA: Diagnosis not present

## 2018-07-26 DIAGNOSIS — E1140 Type 2 diabetes mellitus with diabetic neuropathy, unspecified: Secondary | ICD-10-CM | POA: Diagnosis not present

## 2018-07-26 DIAGNOSIS — I10 Essential (primary) hypertension: Secondary | ICD-10-CM | POA: Diagnosis not present

## 2018-08-18 DIAGNOSIS — R310 Gross hematuria: Secondary | ICD-10-CM | POA: Diagnosis not present

## 2018-08-25 ENCOUNTER — Ambulatory Visit: Payer: Medicare Other | Admitting: Podiatry

## 2018-10-14 ENCOUNTER — Other Ambulatory Visit: Payer: Self-pay | Admitting: Internal Medicine

## 2018-10-14 ENCOUNTER — Ambulatory Visit
Admission: RE | Admit: 2018-10-14 | Discharge: 2018-10-14 | Disposition: A | Discharge status: Home / Self Care | Payer: Medicare Other | Source: Ambulatory Visit | Attending: Internal Medicine | Admitting: Internal Medicine

## 2018-10-14 DIAGNOSIS — K5900 Constipation, unspecified: Secondary | ICD-10-CM

## 2018-10-17 DIAGNOSIS — K5900 Constipation, unspecified: Secondary | ICD-10-CM | POA: Diagnosis not present

## 2018-10-25 DIAGNOSIS — I739 Peripheral vascular disease, unspecified: Secondary | ICD-10-CM | POA: Diagnosis not present

## 2018-10-25 DIAGNOSIS — C61 Malignant neoplasm of prostate: Secondary | ICD-10-CM | POA: Diagnosis not present

## 2018-10-25 DIAGNOSIS — I10 Essential (primary) hypertension: Secondary | ICD-10-CM | POA: Diagnosis not present

## 2018-10-25 DIAGNOSIS — E1140 Type 2 diabetes mellitus with diabetic neuropathy, unspecified: Secondary | ICD-10-CM | POA: Diagnosis not present

## 2018-11-04 DIAGNOSIS — C61 Malignant neoplasm of prostate: Secondary | ICD-10-CM | POA: Diagnosis not present

## 2018-11-08 ENCOUNTER — Other Ambulatory Visit (HOSPITAL_COMMUNITY): Payer: Self-pay

## 2018-11-08 ENCOUNTER — Ambulatory Visit: Payer: Self-pay | Admitting: Family

## 2018-11-11 ENCOUNTER — Ambulatory Visit: Payer: Medicare Other | Admitting: Family

## 2018-11-17 DIAGNOSIS — C61 Malignant neoplasm of prostate: Secondary | ICD-10-CM | POA: Diagnosis not present

## 2018-11-17 DIAGNOSIS — R351 Nocturia: Secondary | ICD-10-CM | POA: Diagnosis not present

## 2019-01-25 DIAGNOSIS — I739 Peripheral vascular disease, unspecified: Secondary | ICD-10-CM | POA: Diagnosis not present

## 2019-01-25 DIAGNOSIS — I714 Abdominal aortic aneurysm, without rupture: Secondary | ICD-10-CM | POA: Diagnosis not present

## 2019-01-25 DIAGNOSIS — C61 Malignant neoplasm of prostate: Secondary | ICD-10-CM | POA: Diagnosis not present

## 2019-01-25 DIAGNOSIS — E1140 Type 2 diabetes mellitus with diabetic neuropathy, unspecified: Secondary | ICD-10-CM | POA: Diagnosis not present

## 2019-03-22 ENCOUNTER — Other Ambulatory Visit: Payer: Self-pay

## 2019-03-22 ENCOUNTER — Emergency Department (HOSPITAL_COMMUNITY): Payer: Medicare Other

## 2019-03-22 ENCOUNTER — Inpatient Hospital Stay (HOSPITAL_COMMUNITY)
Admission: EM | Admit: 2019-03-22 | Discharge: 2019-03-28 | DRG: 558 | Disposition: A | Discharge status: Skilled Nursing Facility | Payer: Medicare Other | Attending: Internal Medicine | Admitting: Internal Medicine

## 2019-03-22 ENCOUNTER — Encounter (HOSPITAL_COMMUNITY): Payer: Self-pay

## 2019-03-22 DIAGNOSIS — I714 Abdominal aortic aneurysm, without rupture: Secondary | ICD-10-CM | POA: Diagnosis not present

## 2019-03-22 DIAGNOSIS — Y92009 Unspecified place in unspecified non-institutional (private) residence as the place of occurrence of the external cause: Secondary | ICD-10-CM | POA: Diagnosis not present

## 2019-03-22 DIAGNOSIS — I480 Paroxysmal atrial fibrillation: Secondary | ICD-10-CM | POA: Diagnosis not present

## 2019-03-22 DIAGNOSIS — Z7982 Long term (current) use of aspirin: Secondary | ICD-10-CM | POA: Diagnosis not present

## 2019-03-22 DIAGNOSIS — T796XXA Traumatic ischemia of muscle, initial encounter: Secondary | ICD-10-CM

## 2019-03-22 DIAGNOSIS — S3992XA Unspecified injury of lower back, initial encounter: Secondary | ICD-10-CM | POA: Diagnosis not present

## 2019-03-22 DIAGNOSIS — Z923 Personal history of irradiation: Secondary | ICD-10-CM

## 2019-03-22 DIAGNOSIS — L8990 Pressure ulcer of unspecified site, unspecified stage: Secondary | ICD-10-CM | POA: Diagnosis present

## 2019-03-22 DIAGNOSIS — E869 Volume depletion, unspecified: Secondary | ICD-10-CM | POA: Diagnosis not present

## 2019-03-22 DIAGNOSIS — F0390 Unspecified dementia without behavioral disturbance: Secondary | ICD-10-CM | POA: Diagnosis not present

## 2019-03-22 DIAGNOSIS — Z23 Encounter for immunization: Secondary | ICD-10-CM

## 2019-03-22 DIAGNOSIS — R296 Repeated falls: Secondary | ICD-10-CM | POA: Diagnosis present

## 2019-03-22 DIAGNOSIS — R2681 Unsteadiness on feet: Secondary | ICD-10-CM | POA: Diagnosis not present

## 2019-03-22 DIAGNOSIS — Z7984 Long term (current) use of oral hypoglycemic drugs: Secondary | ICD-10-CM | POA: Diagnosis not present

## 2019-03-22 DIAGNOSIS — Z79899 Other long term (current) drug therapy: Secondary | ICD-10-CM

## 2019-03-22 DIAGNOSIS — E785 Hyperlipidemia, unspecified: Secondary | ICD-10-CM | POA: Diagnosis present

## 2019-03-22 DIAGNOSIS — Z8546 Personal history of malignant neoplasm of prostate: Secondary | ICD-10-CM

## 2019-03-22 DIAGNOSIS — R627 Adult failure to thrive: Secondary | ICD-10-CM | POA: Diagnosis not present

## 2019-03-22 DIAGNOSIS — Z87442 Personal history of urinary calculi: Secondary | ICD-10-CM

## 2019-03-22 DIAGNOSIS — E860 Dehydration: Secondary | ICD-10-CM | POA: Diagnosis not present

## 2019-03-22 DIAGNOSIS — W19XXXA Unspecified fall, initial encounter: Secondary | ICD-10-CM | POA: Diagnosis present

## 2019-03-22 DIAGNOSIS — E876 Hypokalemia: Secondary | ICD-10-CM | POA: Diagnosis not present

## 2019-03-22 DIAGNOSIS — M6282 Rhabdomyolysis: Principal | ICD-10-CM | POA: Diagnosis present

## 2019-03-22 DIAGNOSIS — L89312 Pressure ulcer of right buttock, stage 2: Secondary | ICD-10-CM | POA: Diagnosis not present

## 2019-03-22 DIAGNOSIS — Z6833 Body mass index (BMI) 33.0-33.9, adult: Secondary | ICD-10-CM

## 2019-03-22 DIAGNOSIS — R350 Frequency of micturition: Secondary | ICD-10-CM | POA: Diagnosis present

## 2019-03-22 DIAGNOSIS — Z7401 Bed confinement status: Secondary | ICD-10-CM | POA: Diagnosis not present

## 2019-03-22 DIAGNOSIS — Z794 Long term (current) use of insulin: Secondary | ICD-10-CM | POA: Diagnosis not present

## 2019-03-22 DIAGNOSIS — Z03818 Encounter for observation for suspected exposure to other biological agents ruled out: Secondary | ICD-10-CM | POA: Diagnosis not present

## 2019-03-22 DIAGNOSIS — E119 Type 2 diabetes mellitus without complications: Secondary | ICD-10-CM | POA: Diagnosis present

## 2019-03-22 DIAGNOSIS — E861 Hypovolemia: Secondary | ICD-10-CM | POA: Diagnosis not present

## 2019-03-22 DIAGNOSIS — M2550 Pain in unspecified joint: Secondary | ICD-10-CM | POA: Diagnosis not present

## 2019-03-22 DIAGNOSIS — W1830XA Fall on same level, unspecified, initial encounter: Secondary | ICD-10-CM | POA: Diagnosis present

## 2019-03-22 DIAGNOSIS — Z87891 Personal history of nicotine dependence: Secondary | ICD-10-CM

## 2019-03-22 DIAGNOSIS — R531 Weakness: Secondary | ICD-10-CM | POA: Diagnosis not present

## 2019-03-22 DIAGNOSIS — R2689 Other abnormalities of gait and mobility: Secondary | ICD-10-CM | POA: Diagnosis not present

## 2019-03-22 DIAGNOSIS — R4182 Altered mental status, unspecified: Secondary | ICD-10-CM | POA: Diagnosis not present

## 2019-03-22 DIAGNOSIS — R41841 Cognitive communication deficit: Secondary | ICD-10-CM | POA: Diagnosis not present

## 2019-03-22 DIAGNOSIS — Z8249 Family history of ischemic heart disease and other diseases of the circulatory system: Secondary | ICD-10-CM | POA: Diagnosis not present

## 2019-03-22 DIAGNOSIS — M25552 Pain in left hip: Secondary | ICD-10-CM | POA: Diagnosis not present

## 2019-03-22 DIAGNOSIS — Z20828 Contact with and (suspected) exposure to other viral communicable diseases: Secondary | ICD-10-CM | POA: Diagnosis not present

## 2019-03-22 DIAGNOSIS — Z66 Do not resuscitate: Secondary | ICD-10-CM | POA: Diagnosis present

## 2019-03-22 DIAGNOSIS — I10 Essential (primary) hypertension: Secondary | ICD-10-CM | POA: Diagnosis present

## 2019-03-22 DIAGNOSIS — S3993XA Unspecified injury of pelvis, initial encounter: Secondary | ICD-10-CM | POA: Diagnosis not present

## 2019-03-22 DIAGNOSIS — R278 Other lack of coordination: Secondary | ICD-10-CM | POA: Diagnosis not present

## 2019-03-22 DIAGNOSIS — S199XXA Unspecified injury of neck, initial encounter: Secondary | ICD-10-CM | POA: Diagnosis not present

## 2019-03-22 DIAGNOSIS — R5381 Other malaise: Secondary | ICD-10-CM | POA: Diagnosis not present

## 2019-03-22 DIAGNOSIS — L89319 Pressure ulcer of right buttock, unspecified stage: Secondary | ICD-10-CM | POA: Diagnosis not present

## 2019-03-22 DIAGNOSIS — E1169 Type 2 diabetes mellitus with other specified complication: Secondary | ICD-10-CM | POA: Diagnosis not present

## 2019-03-22 DIAGNOSIS — G47419 Narcolepsy without cataplexy: Secondary | ICD-10-CM | POA: Diagnosis not present

## 2019-03-22 DIAGNOSIS — T796XXD Traumatic ischemia of muscle, subsequent encounter: Secondary | ICD-10-CM | POA: Diagnosis not present

## 2019-03-22 DIAGNOSIS — C61 Malignant neoplasm of prostate: Secondary | ICD-10-CM | POA: Diagnosis not present

## 2019-03-22 DIAGNOSIS — R404 Transient alteration of awareness: Secondary | ICD-10-CM | POA: Diagnosis not present

## 2019-03-22 DIAGNOSIS — M6281 Muscle weakness (generalized): Secondary | ICD-10-CM | POA: Diagnosis not present

## 2019-03-22 DIAGNOSIS — I959 Hypotension, unspecified: Secondary | ICD-10-CM | POA: Diagnosis not present

## 2019-03-22 DIAGNOSIS — E118 Type 2 diabetes mellitus with unspecified complications: Secondary | ICD-10-CM | POA: Diagnosis not present

## 2019-03-22 DIAGNOSIS — S0990XA Unspecified injury of head, initial encounter: Secondary | ICD-10-CM | POA: Diagnosis not present

## 2019-03-22 LAB — BASIC METABOLIC PANEL
Anion gap: 10 (ref 5–15)
BUN: 24 mg/dL — ABNORMAL HIGH (ref 8–23)
CO2: 26 mmol/L (ref 22–32)
Calcium: 9 mg/dL (ref 8.9–10.3)
Chloride: 100 mmol/L (ref 98–111)
Creatinine, Ser: 1.09 mg/dL (ref 0.61–1.24)
GFR calc Af Amer: >60 mL/min (ref >60)
GFR calc non Af Amer: 59 mL/min — ABNORMAL LOW (ref >60)
Glucose, Bld: 166 mg/dL — ABNORMAL HIGH (ref 70–99)
Potassium: 3.7 mmol/L (ref 3.5–5.1)
Sodium: 136 mmol/L (ref 135–145)

## 2019-03-22 LAB — URINALYSIS, ROUTINE W REFLEX MICROSCOPIC
Bacteria, UA: NONE SEEN
Bilirubin Urine: NEGATIVE
Glucose, UA: NEGATIVE mg/dL
Ketones, ur: NEGATIVE mg/dL
Leukocytes,Ua: NEGATIVE
Nitrite: NEGATIVE
Protein, ur: NEGATIVE mg/dL
Specific Gravity, Urine: 1.015 (ref 1.005–1.030)
pH: 7 (ref 5.0–8.0)

## 2019-03-22 LAB — CBC WITH DIFFERENTIAL/PLATELET
Abs Immature Granulocytes: 0.04 10*3/uL (ref 0.00–0.07)
Basophils Absolute: 0.1 10*3/uL (ref 0.0–0.1)
Basophils Relative: 1 %
Eosinophils Absolute: 0.1 10*3/uL (ref 0.0–0.5)
Eosinophils Relative: 1 %
HCT: 35.9 % — ABNORMAL LOW (ref 39.0–52.0)
Hemoglobin: 11.6 g/dL — ABNORMAL LOW (ref 13.0–17.0)
Immature Granulocytes: 0 %
Lymphocytes Relative: 12 %
Lymphs Abs: 1.2 10*3/uL (ref 0.7–4.0)
MCH: 31.4 pg (ref 26.0–34.0)
MCHC: 32.3 g/dL (ref 30.0–36.0)
MCV: 97 fL (ref 80.0–100.0)
Monocytes Absolute: 1.3 10*3/uL — ABNORMAL HIGH (ref 0.1–1.0)
Monocytes Relative: 13 %
Neutro Abs: 7.3 10*3/uL (ref 1.7–7.7)
Neutrophils Relative %: 73 %
Platelets: 234 10*3/uL (ref 150–400)
RBC: 3.7 MIL/uL — ABNORMAL LOW (ref 4.22–5.81)
RDW: 14.2 % (ref 11.5–15.5)
WBC: 10 10*3/uL (ref 4.0–10.5)
nRBC: 0 % (ref 0.0–0.2)

## 2019-03-22 LAB — CK: Total CK: 1030 U/L — ABNORMAL HIGH (ref 49–397)

## 2019-03-22 LAB — SARS CORONAVIRUS 2 (TAT 6-24 HRS): SARS Coronavirus 2: NEGATIVE

## 2019-03-22 LAB — CBG MONITORING, ED: Glucose-Capillary: 162 mg/dL — ABNORMAL HIGH (ref 70–99)

## 2019-03-22 MED ORDER — INSULIN ASPART 100 UNIT/ML ~~LOC~~ SOLN
0.0000 [IU] | Freq: Three times a day (TID) | SUBCUTANEOUS | Status: DC
  Administered 2019-03-23: 5 [IU] via SUBCUTANEOUS
  Administered 2019-03-23 (×2): 3 [IU] via SUBCUTANEOUS
  Administered 2019-03-24: 8 [IU] via SUBCUTANEOUS
  Administered 2019-03-25: 3 [IU] via SUBCUTANEOUS
  Administered 2019-03-25 (×2): 2 [IU] via SUBCUTANEOUS
  Administered 2019-03-26: 3 [IU] via SUBCUTANEOUS
  Administered 2019-03-26: 2 [IU] via SUBCUTANEOUS
  Administered 2019-03-27: 3 [IU] via SUBCUTANEOUS
  Administered 2019-03-27 – 2019-03-28 (×2): 2 [IU] via SUBCUTANEOUS
  Filled 2019-03-22: qty 0.15

## 2019-03-22 MED ORDER — INFLUENZA VAC A&B SA ADJ QUAD 0.5 ML IM PRSY
0.5000 mL | PREFILLED_SYRINGE | INTRAMUSCULAR | Status: AC
  Administered 2019-03-23: 0.5 mL via INTRAMUSCULAR
  Filled 2019-03-22: qty 0.5

## 2019-03-22 MED ORDER — ENOXAPARIN SODIUM 40 MG/0.4ML ~~LOC~~ SOLN
40.0000 mg | SUBCUTANEOUS | Status: DC
  Administered 2019-03-22 – 2019-03-27 (×6): 40 mg via SUBCUTANEOUS
  Filled 2019-03-22 (×6): qty 0.4

## 2019-03-22 MED ORDER — SODIUM CHLORIDE 0.9 % IV BOLUS
1000.0000 mL | Freq: Once | INTRAVENOUS | Status: AC
  Administered 2019-03-22: 1000 mL via INTRAVENOUS

## 2019-03-22 MED ORDER — SODIUM CHLORIDE 0.9 % IV SOLN
INTRAVENOUS | Status: DC
  Administered 2019-03-22: via INTRAVENOUS

## 2019-03-22 NOTE — ED Provider Notes (Signed)
[facility] DEPT Provider Note   CSN: [id number] Arrival date & time: 03/22/19  [id number]     History   Chief Complaint Chief Complaint  Patient presents with  . Fall    HPI Omar Gomez is a 85 y.o. male last medical history significant for abdominal aortic aneurysm without rupture, dementia, blepharitis, prostate cancer s/p radiation, type 2 diabetes, hypertension, anemia, away presents to emergency department today with chief complaint of fall. Pt's wife is at the bedside and provides most of the history. She states patient has been weak x 1 week. He ambulates with a walker at home and minimal assistance but has been unable to recently. Yesterday he was sitting in a recliner chair and when attempting to stand up he fell to the floor landing in a seated position. EMS was called and helped the patient get to bed. Later the wife went to check on him and he was sitting on the floor bedroom floor and leaning against the bed. She was not able to help him up and did not want to wake the neighbors so she gave him a pillow and blanket and he slept on the floor for several hours. Pt did not take any medications for his symptoms prior to arrival. Wife is also reporting that pt has had increased urinary frequency over the last week. She denies pt have fever, cough, vomiting, diarrhea, gross hematuria. Patient is not anticoagulated.   He is unable to contribute to the history given dementia, level 5 caveat applies.  Chart review shows pt had office visit x 6 months ago with vascular surgery.  He had AAA duplex with no increase in size at 5.4 cm. Plan was for repear AA duplex in 6 months.  Past Medical History:  Diagnosis Date  . AAA (abdominal aortic aneurysm) ([place])   . Anal fissure   . Biceps tendon rupture   . Bladder stone   . Blepharitis   . Cancer ([place])    PROSTATE CANCER TREATED WITH RADIATION  . Cataract   . Dermatitis contact, eyelid   . Diabetes mellitus  without complication ([place])   . Hyperlipidemia   . Hypertension   . Kidney stones   . Narcolepsy   . OA (osteoarthritis) of knee   . Olecranon bursitis   . Shingles 2011    Patient Active Problem List   Diagnosis Date Noted  . AAA (abdominal aortic aneurysm) without rupture ([place]) 10/12/2013    Past Surgical History:  Procedure Laterality Date  . FACIAL COSMETIC SURGERY     Lesion removed to rule out melanoma  . HERNIA REPAIR     1980        Home Medications    Prior to Admission medications   Medication Sig Start Date End Date Taking? Authorizing Provider  acetaminophen (TYLENOL) 500 MG tablet Take 500 mg by mouth every 6 (six) hours as needed for mild pain or headache. Take 2 tablets every 6 hours.    Yes [provider]  amLODipine (NORVASC) 5 MG tablet Take 5 mg by mouth daily.   Yes [provider]  aspirin 81 MG tablet Take 81 mg by mouth every other day. Pt takes one tablet every two days.   Yes [provider]  atenolol (TENORMIN) 50 MG tablet Take 25 mg by mouth daily.    Yes [provider]  atorvastatin (LIPITOR) 80 MG tablet Take 80 mg by mouth daily.    Yes [provider]  ezetimibe (ZETIA) 10 MG tablet Take 10 mg by mouth daily.   Yes [provider]  hydrochlorothiazide (HYDRODIURIL) 25 MG tablet Take 25 mg by mouth daily.   Yes [provider]  metFORMIN (GLUCOPHAGE) 500 MG tablet Take 500 mg by mouth daily with breakfast.  10/27/16  Yes [provider]  trolamine salicylate (ASPERCREME) 10 % cream Apply 1 application topically as needed for muscle pain.   Yes [provider]    Family History Family History  Problem Relation Age of Onset  . Alcohol abuse Brother   . Heart disease Brother     Social History Social History   Tobacco Use  . Smoking status: Former Smoker    Quit date: 06/02/1963    Years since quitting: 55.8  . Smokeless tobacco: Never Used  Substance Use  Topics  . Alcohol use: No  . Drug use: No     Allergies   Patient has no known allergies.   Review of Systems Review of Systems  Unable to perform ROS: Dementia     Physical Exam Updated Vital Signs BP (!) 124/53 (BP Location: Right Arm)   Pulse (!) 56   Temp 98.8 F (37.1 C) (Oral)   Resp 17   Ht 5\' 7"  (1.702 m)   Wt 98 kg   SpO2 93%   BMI 33.84 kg/m   Physical Exam Vitals signs and nursing note reviewed.  Constitutional:      General: He is not in acute distress.    Appearance: He is not ill-appearing.  HENT:     Head: Normocephalic and atraumatic.     Right Ear: Tympanic membrane and external ear normal.     Left Ear: Tympanic membrane and external ear normal.     Nose: Nose normal.     Mouth/Throat:     Mouth: Mucous membranes are dry.     Pharynx: Oropharynx is clear.  Eyes:     General: No scleral icterus.       Right eye: No discharge.        Left eye: No discharge.     Extraocular Movements: Extraocular movements intact.     Conjunctiva/sclera: Conjunctivae normal.     Pupils: Pupils are equal, round, and reactive to light.  Neck:     Musculoskeletal: Normal range of motion.     Vascular: No JVD.  Cardiovascular:     Rate and Rhythm: Normal rate and regular rhythm.     Pulses: Normal pulses.          Radial pulses are 2+ on the right side and 2+ on the left side.     Heart sounds: Normal heart sounds.  Pulmonary:     Comments: Lungs clear to auscultation in all fields. Symmetric chest rise. No wheezing, rales, or rhonchi. Abdominal:     Comments: Abdomen is soft, non-distended, and non-tender in all quadrants. No rigidity, no guarding. No peritoneal signs.  Musculoskeletal:     Right lower leg: No edema.     Left lower leg: No edema.     Comments: Full passive ROM of bilateral lower extremities. No leg length discrepancies.  Pelvis is stable.  Skin:    General: Skin is warm and dry.     Capillary Refill: Capillary refill takes less than 2  seconds.  Neurological:     Mental Status: Mental status is at baseline.     GCS: GCS eye subscore is 4. GCS verbal subscore is 5. GCS motor subscore  is 6.     Comments: Fluent speech, no facial droop.   Pt is at baseline per family at the bedside. He is alert to self and location only. He is able to follow simple commands.  Psychiatric:        Behavior: Behavior normal.      ED Treatments / Results  Labs (all labs ordered are listed, but only abnormal results are displayed) Labs Reviewed  CBC WITH DIFFERENTIAL/PLATELET - Abnormal; Notable for the following components:      Result Value   RBC 3.70 (*)    Hemoglobin 11.6 (*)    HCT 35.9 (*)    Monocytes Absolute 1.3 (*)    All other components within normal limits  URINALYSIS, ROUTINE W REFLEX MICROSCOPIC - Abnormal; Notable for the following components:   Hgb urine dipstick SMALL (*)    All other components within normal limits  BASIC METABOLIC PANEL - Abnormal; Notable for the following components:   Glucose, Bld 166 (*)    BUN 24 (*)    GFR calc non Af Amer 59 (*)    All other components within normal limits  CK - Abnormal; Notable for the following components:   Total CK 1,030 (*)    All other components within normal limits  CBG MONITORING, ED - Abnormal; Notable for the following components:   Glucose-Capillary 162 (*)    All other components within normal limits  URINE CULTURE  SARS CORONAVIRUS 2 (TAT 6-24 HRS)    EKG None  Radiology Dg Lumbar Spine Complete  Result Date: 03/22/2019 CLINICAL DATA:  Fall EXAM: LUMBAR SPINE - COMPLETE 4+ VIEW COMPARISON:  04/15/2010 FINDINGS: Diffuse degenerative disc disease with disc space narrowing and spurring. Normal alignment. No fracture. Aortic atherosclerosis with aneurysmal dilatation of the abdominal aorta measuring up to 6.2 cm. There may be some magnification on these plain films. IMPRESSION: Diffuse degenerative changes throughout the lumbar spine. No acute bony  abnormality. Abdominal aortic aneurysm measuring up to 6.2 cm. This could be further evaluated with ultrasound or CT. Electronically Signed   By: Rolm Baptise M.D.   On: 03/22/2019 11:55   Dg Pelvis 1-2 Views  Result Date: 03/22/2019 CLINICAL DATA:  Fall EXAM: PELVIS - 1-2 VIEW COMPARISON:  None. FINDINGS: Symmetric degenerative changes in the hips with joint space narrowing and spurring. SI joints symmetric and unremarkable. Degenerative changes in the visualized lower lumbar spine. No acute bony abnormality. Specifically, no fracture, subluxation, or dislocation. Vascular calcifications noted. IMPRESSION: Degenerative changes in the hips bilaterally. No acute bony abnormality. Electronically Signed   By: Rolm Baptise M.D.   On: 03/22/2019 11:53    Procedures Procedures (including critical care time)  Medications Ordered in ED Medications  sodium chloride 0.9 % bolus 1,000 mL (1,000 mLs Intravenous New Bag/Given 03/22/19 1307)     Initial Impression / Assessment and Plan / ED Course  I have reviewed the triage vital signs and the nursing notes.  Pertinent labs & imaging results that were available during my care of the patient were reviewed by me and considered in my medical decision making (see chart for details).  Patient seen and examined. Patient nontoxic appearing, in no apparent distress, vitals WNL. On exam he is resting comfortably. He is pleasantly demented and alert to himself and location only. He has no signs of head injury on exam. Lungs are clear to auscultation in all fields. No abdominal tenderness on exam. Full passive ROM of all extremities.   CBC shows  hemoglobin of 11.6, no prior to compare to.  BMP overall unremarkable. UA with small blood but no signs of infection. Urine culture sent as he has urinary frequency. CK is elevated to 1030. IVF started.  Xray pelvis is negative for fracture or dislocation.  Xray lumbar spine without acute findings but does comment on  Abdominal aortic aneurysm measuring up to 6.2 cm. Pt is currently followed by vascular Dr. Oneida Alar and last AAA duplex was in 05/20/2018 measuring 5.4 cm and plan according to chart review is continue to monitor and [place] every 6 months. He has serial negative abdominal exams today. Ct head and Ct cervical spine are negative for acute findings. The results are not crossing into the chart. This case was discussed with Dr. Tomi Bamberger who has seen the patient and agrees with plan to admit given multiple falls, rhabdo.  Spoke with Dr. Rodena Piety with hospitalist service who agrees to assume care of patient and bring into the hospital for further evaluation and management.     Portions of this note were generated with [employer]. Dictation errors may occur despite best attempts at proofreading.    Final Clinical Impressions(s) / ED Diagnoses   Final diagnoses:  Traumatic rhabdomyolysis, initial encounter [facility])  Fall, initial encounter    ED Discharge Orders    None       Flint Melter 03/22/19 1757    Dorie Rank, MD 03/23/19 [phone]

## 2019-03-22 NOTE — ED Notes (Signed)
Condom cath has been placed. Pt and family member has been informed.

## 2019-03-22 NOTE — H&P (Signed)
History and Physical    Omar Gomez [id number] DOB: 1924/07/24 DOA: 03/22/2019  PCP: Omar Low, MD Patient coming from: Home lives with wife at home  Chief Complaint: Multiple falls  HPI: Omar Gomez is a 85 y.o. male with medical history significant of type 2 diabetes, hypertension, prostate cancer admitted with multiple falls at home.  Wife reports that he has generalized weakness which is gotten worse and he was on the floor for 4 hours and she called the police to get him out of the floor after the police left patient fell again on the floor.  And then remained on the floor for 4 hours before he called EMS.  She denied any fever chills cough shortness of breath nausea vomiting diarrhea chest pain shortness of breath or loss of consciousness or urinary complaints.  However she reports after getting treatment for prostate cancer in October 2019 he has progressive decline.  Denies any sick contacts.  Patient has very minimal p.o. intake refuses to eat or drink.    ED Course: Sodium 136 potassium 3.7 BUN 24 creatinine 1.09 CPK was 1030.  White count 10 hemoglobin 11.6 platelet count 234.  Covid pending.  UA negative for any signs of infection.  X-ray lumbar spine no evidence of acute fracture.  However he has an abdominal aortic aneurysm 6.2 cm.  X-ray of the pelvis no evidence of acute fracture.  CT head no acute hemorrhage age related atrophy and chronic microvascular changes CT of the cervical spine no acute findings  Review of Systems: As per HPI otherwise all other systems reviewed and are negative  Ambulatory Status: Patient moves from bed to chair and chair to bed with difficulty.  He is normally sitting up all day long.  Past Medical History:  Diagnosis Date  . AAA (abdominal aortic aneurysm) ([place])   . Anal fissure   . Biceps tendon rupture   . Bladder stone   . Blepharitis   . Cancer ([place])    PROSTATE CANCER TREATED WITH RADIATION  . Cataract   . Dermatitis  contact, eyelid   . Diabetes mellitus without complication ([place])   . Hyperlipidemia   . Hypertension   . Kidney stones   . Narcolepsy   . OA (osteoarthritis) of knee   . Olecranon bursitis   . Shingles 2011    Past Surgical History:  Procedure Laterality Date  . FACIAL COSMETIC SURGERY     Lesion removed to rule out melanoma  . HERNIA REPAIR     1980    Social History   Socioeconomic History  . Marital status: Married    Spouse name: Not on file  . Number of children: Not on file  . Years of education: Not on file  . Highest education level: Not on file  Occupational History  . Not on file  Social Needs  . Financial resource strain: Not on file  . Food insecurity    Worry: Not on file    Inability: Not on file  . Transportation needs    Medical: Not on file    Non-medical: Not on file  Tobacco Use  . Smoking status: Former Smoker    Quit date: 06/02/1963    Years since quitting: 55.8  . Smokeless tobacco: Never Used  Substance and Sexual Activity  . Alcohol use: No  . Drug use: No  . Sexual activity: Not on file  Lifestyle  . Physical activity    Days per week: Not on file  Minutes per session: Not on file  . Stress: Not on file  Relationships  . Social Herbalist on phone: Not on file    Gets together: Not on file    Attends religious service: Not on file    Active member of club or organization: Not on file    Attends meetings of clubs or organizations: Not on file    Relationship status: Not on file  . Intimate partner violence    Fear of current or ex partner: Not on file    Emotionally abused: Not on file    Physically abused: Not on file    Forced sexual activity: Not on file  Other Topics Concern  . Not on file  Social History Narrative  . Not on file    No Known Allergies  Family History  Problem Relation Age of Onset  . Alcohol abuse Brother   . Heart disease Brother     Prior to Admission medications   Medication Sig  Start Date End Date Taking? Authorizing Provider  acetaminophen (TYLENOL) 500 MG tablet Take 500 mg by mouth every 6 (six) hours as needed for mild pain or headache. Take 2 tablets every 6 hours.    Yes [provider]  amLODipine (NORVASC) 5 MG tablet Take 5 mg by mouth daily.   Yes [provider]  aspirin 81 MG tablet Take 81 mg by mouth every other day. Pt takes one tablet every two days.   Yes [provider]  atenolol (TENORMIN) 50 MG tablet Take 25 mg by mouth daily.    Yes [provider]  atorvastatin (LIPITOR) 80 MG tablet Take 80 mg by mouth daily.    Yes [provider]  ezetimibe (ZETIA) 10 MG tablet Take 10 mg by mouth daily.   Yes [provider]  hydrochlorothiazide (HYDRODIURIL) 25 MG tablet Take 25 mg by mouth daily.   Yes [provider]  metFORMIN (GLUCOPHAGE) 500 MG tablet Take 500 mg by mouth daily with breakfast.  10/27/16  Yes [provider]  trolamine salicylate (ASPERCREME) 10 % cream Apply 1 application topically as needed for muscle pain.   Yes [provider]    Physical Exam: Elderly male in no acute distress Vitals:   03/22/19 1008 03/22/19 1012 03/22/19 1030 03/22/19 1355  BP:  (!) 124/53 125/76 (!) 120/58  Pulse:  (!) 56 (!) 53 72  Resp:  17  17  Temp:  98.8 F (37.1 C)    TempSrc:  Oral    SpO2:  93% 92% 96%  Weight: 98 kg     Height: 5\' 7"  (1.702 m)       Oral mucosa dry . General:  Appears calm and comfortable . Eyes:  PERRL, EOMI, normal lids, iris . ENT:  grossly normal hearing, lips & tongue, mmm . Neck:  no LAD, masses or thyromegaly . Cardiovascular:  RRR, no m/r/g. No LE edema.  Marland Kitchen Respiratory:  CTA bilaterally, no w/r/r. Normal respiratory effort. . Abdomen:  soft, ntnd, NABS . Skin:  no rash or induration seen on limited exam . Musculoskeletal:  grossly normal tone BUE/BLE, good ROM, no bony abnormality . Psychiatric:  grossly normal mood and affect, speech  fluent and appropriate, AOx3 . Neurologic:  CN 2-12 grossly intact, moves all extremities in coordinated fashion, sensation intact  Labs on Admission: I have personally reviewed following labs and imaging studies  CBC: Recent Labs  Lab 03/22/19 1107  WBC 10.0  NEUTROABS 7.3  HGB 11.6*  HCT 35.9*  MCV 97.0  PLT [id number]   Basic Metabolic Panel: Recent Labs  Lab 03/22/19 1107  NA 136  K 3.7  CL 100  CO2 26  GLUCOSE 166*  BUN 24*  CREATININE 1.09  CALCIUM 9.0   GFR: Estimated Creatinine Clearance: 48.3 mL/min (by C-G formula based on SCr of 1.09 mg/dL). Liver Function Tests: No results for input(s): AST, ALT, ALKPHOS, BILITOT, PROT, ALBUMIN in the last 168 hours. No results for input(s): LIPASE, AMYLASE in the last 168 hours. No results for input(s): AMMONIA in the last 168 hours. Coagulation Profile: No results for input(s): INR, PROTIME in the last 168 hours. Cardiac Enzymes: Recent Labs  Lab 03/22/19 1107  CKTOTAL 1,030*   BNP (last 3 results) No results for input(s): PROBNP in the last 8760 hours. HbA1C: No results for input(s): HGBA1C in the last 72 hours. CBG: Recent Labs  Lab 03/22/19 1057  GLUCAP 162*   Lipid Profile: No results for input(s): CHOL, HDL, LDLCALC, TRIG, CHOLHDL, LDLDIRECT in the last 72 hours. Thyroid Function Tests: No results for input(s): TSH, T4TOTAL, FREET4, T3FREE, THYROIDAB in the last 72 hours. Anemia Panel: No results for input(s): VITAMINB12, FOLATE, FERRITIN, TIBC, IRON, RETICCTPCT in the last 72 hours. Urine analysis:    Component Value Date/Time   COLORURINE YELLOW 03/22/2019 [place] 03/22/2019 1345   LABSPEC 1.015 03/22/2019 1345   PHURINE 7.0 03/22/2019 1345   GLUCOSEU NEGATIVE 03/22/2019 1345   HGBUR SMALL (A) 03/22/2019 1345   BILIRUBINUR NEGATIVE 03/22/2019 1345   KETONESUR NEGATIVE 03/22/2019 1345   PROTEINUR NEGATIVE 03/22/2019 1345   NITRITE NEGATIVE 03/22/2019 1345   LEUKOCYTESUR NEGATIVE  03/22/2019 1345    Creatinine Clearance: Estimated Creatinine Clearance: 48.3 mL/min (by C-G formula based on SCr of 1.09 mg/dL).  Sepsis Labs: @LABRCNTIP (procalcitonin:4,lacticidven:4) )No results found for this or any previous visit (from the past 240 hour(s)).   Radiological Exams on Admission: Dg Lumbar Spine Complete  Result Date: 03/22/2019 CLINICAL DATA:  Fall EXAM: LUMBAR SPINE - COMPLETE 4+ VIEW COMPARISON:  04/15/2010 FINDINGS: Diffuse degenerative disc disease with disc space narrowing and spurring. Normal alignment. No fracture. Aortic atherosclerosis with aneurysmal dilatation of the abdominal aorta measuring up to 6.2 cm. There may be some magnification on these plain films. IMPRESSION: Diffuse degenerative changes throughout the lumbar spine. No acute bony abnormality. Abdominal aortic aneurysm measuring up to 6.2 cm. This could be further evaluated with ultrasound or CT. Electronically Signed   By: Rolm Baptise M.D.   On: 03/22/2019 11:55   Dg Pelvis 1-2 Views  Result Date: 03/22/2019 CLINICAL DATA:  Fall EXAM: PELVIS - 1-2 VIEW COMPARISON:  None. FINDINGS: Symmetric degenerative changes in the hips with joint space narrowing and spurring. SI joints symmetric and unremarkable. Degenerative changes in the visualized lower lumbar spine. No acute bony abnormality. Specifically, no fracture, subluxation, or dislocation. Vascular calcifications noted. IMPRESSION: Degenerative changes in the hips bilaterally. No acute bony abnormality. Electronically Signed   By: Rolm Baptise M.D.   On: 03/22/2019 11:53   Ct Head Wo Contrast  Result Date: 03/22/2019 CLINICAL DATA:  85 year old male with fall and head trauma. EXAM: CT HEAD WITHOUT CONTRAST CT CERVICAL SPINE WITHOUT CONTRAST TECHNIQUE: Multidetector CT imaging of the head and cervical spine was performed following the standard protocol without intravenous contrast. Multiplanar CT image reconstructions of the cervical spine were also  generated. COMPARISON:  None. FINDINGS: CT HEAD FINDINGS Brain: Mild age-related atrophy and  chronic microvascular ischemic changes. There is no acute intracranial hemorrhage. No mass effect or midline shift. No extra-axial fluid collection. Vascular: No hyperdense vessel or unexpected calcification. Skull: Right frontal craniotomy. No acute calvarial pathology. Sinuses/Orbits: Mild mucoperiosteal thickening of paranasal sinuses. No air-fluid level. The mastoid air cells are clear. Other: None. CT CERVICAL SPINE FINDINGS Alignment: No acute subluxation. Grade 1 C4-C5 and C7-T1 anterolisthesis. Skull base and vertebrae: No acute fracture. Soft tissues and spinal canal: No prevertebral fluid or swelling. No visible canal hematoma. Disc levels: Multilevel degenerative changes with endplate irregularity and disc space narrowing. Upper chest: Negative. Other: Bilateral carotid bulb calcified plaques. IMPRESSION: 1. No acute intracranial hemorrhage. Age-related atrophy and chronic microvascular ischemic changes. 2. No acute/traumatic cervical spine pathology. Electronically Signed   By: Anner Crete M.D.   On: 03/22/2019 13:05   Ct Cervical Spine Wo Contrast  Result Date: 03/22/2019 CLINICAL DATA:  85 year old male with fall and head trauma. EXAM: CT HEAD WITHOUT CONTRAST CT CERVICAL SPINE WITHOUT CONTRAST TECHNIQUE: Multidetector CT imaging of the head and cervical spine was performed following the standard protocol without intravenous contrast. Multiplanar CT image reconstructions of the cervical spine were also generated. COMPARISON:  None. FINDINGS: CT HEAD FINDINGS Brain: Mild age-related atrophy and chronic microvascular ischemic changes. There is no acute intracranial hemorrhage. No mass effect or midline shift. No extra-axial fluid collection. Vascular: No hyperdense vessel or unexpected calcification. Skull: Right frontal craniotomy. No acute calvarial pathology. Sinuses/Orbits: Mild mucoperiosteal  thickening of paranasal sinuses. No air-fluid level. The mastoid air cells are clear. Other: None. CT CERVICAL SPINE FINDINGS Alignment: No acute subluxation. Grade 1 C4-C5 and C7-T1 anterolisthesis. Skull base and vertebrae: No acute fracture. Soft tissues and spinal canal: No prevertebral fluid or swelling. No visible canal hematoma. Disc levels: Multilevel degenerative changes with endplate irregularity and disc space narrowing. Upper chest: Negative. Other: Bilateral carotid bulb calcified plaques. IMPRESSION: 1. No acute intracranial hemorrhage. Age-related atrophy and chronic microvascular ischemic changes. 2. No acute/traumatic cervical spine pathology. Electronically Signed   By: Anner Crete M.D.   On: 03/22/2019 13:05     Assessment/Plan Active Problems:   Fall   #1 multiple falls-patient with failure to thrive decreased p.o. intake dehydration and multiple falls lives at home with his wife.  Wife does not want him to go to any rehab.  She is planning to get 24-hour help for him at home.  She has 2 daughters who lives in [place] and multiple grandchildren who can help her.  She just wants them hydrated and sending back home with therapy and with 24-hour care.  #2 dehydration patient with minimal p.o. intake at home I will start him on fluids recheck his CPK in a.m.  #3 rhabdomyolysis with elevated CPK of 1030 hydrate and recheck.  #4 abdominal aortic aneurysm December 2019 it was 5.4 cm now it is up to 6.4 cm followed by Dr. Oneida Alar.  #5 essential hypertension his blood pressure is soft I will hold off on restarting Norvasc and Tenormin.  Please restart as needed.  Hold hydrochlorothiazide.  #6 hyperlipidemia on Lipitor and Zetia at home restart once up to resolved.  #7 type 2 diabetes on Glucophage at home .  Start SSI while in hospital.  Estimated body mass index is 33.84 kg/m as calculated from the following:   Height as of this encounter: 5\' 7"  (1.702 m).   Weight as of  this encounter: 98 kg.   DVT prophylaxis: Lovenox Code Status: DNR Family Communication: Discussed  with wife Disposition Plan: Pending hydration and PT eval Consults called: None Admission status: Observation   Georgette Shell MD [facility]  If 7PM-7AM, please contact night-coverage [email or web address] Password [email or web address]  03/22/2019, 5:41 PM

## 2019-03-22 NOTE — ED Triage Notes (Signed)
Per EMS , patient had fall last night and one today. Family reported patient is weaker than normal for the past week. Patient has dementia.

## 2019-03-23 ENCOUNTER — Inpatient Hospital Stay (HOSPITAL_COMMUNITY): Payer: Medicare Other

## 2019-03-23 ENCOUNTER — Other Ambulatory Visit: Payer: Self-pay

## 2019-03-23 DIAGNOSIS — Z8546 Personal history of malignant neoplasm of prostate: Secondary | ICD-10-CM | POA: Diagnosis not present

## 2019-03-23 DIAGNOSIS — I10 Essential (primary) hypertension: Secondary | ICD-10-CM | POA: Diagnosis present

## 2019-03-23 DIAGNOSIS — R296 Repeated falls: Secondary | ICD-10-CM | POA: Diagnosis present

## 2019-03-23 DIAGNOSIS — Z87442 Personal history of urinary calculi: Secondary | ICD-10-CM | POA: Diagnosis not present

## 2019-03-23 DIAGNOSIS — E118 Type 2 diabetes mellitus with unspecified complications: Secondary | ICD-10-CM | POA: Diagnosis not present

## 2019-03-23 DIAGNOSIS — E876 Hypokalemia: Secondary | ICD-10-CM | POA: Diagnosis not present

## 2019-03-23 DIAGNOSIS — E861 Hypovolemia: Secondary | ICD-10-CM | POA: Diagnosis not present

## 2019-03-23 DIAGNOSIS — R627 Adult failure to thrive: Secondary | ICD-10-CM | POA: Diagnosis present

## 2019-03-23 DIAGNOSIS — G47419 Narcolepsy without cataplexy: Secondary | ICD-10-CM | POA: Diagnosis present

## 2019-03-23 DIAGNOSIS — Z7982 Long term (current) use of aspirin: Secondary | ICD-10-CM | POA: Diagnosis not present

## 2019-03-23 DIAGNOSIS — F0390 Unspecified dementia without behavioral disturbance: Secondary | ICD-10-CM | POA: Diagnosis present

## 2019-03-23 DIAGNOSIS — R350 Frequency of micturition: Secondary | ICD-10-CM | POA: Diagnosis present

## 2019-03-23 DIAGNOSIS — R5381 Other malaise: Secondary | ICD-10-CM

## 2019-03-23 DIAGNOSIS — E869 Volume depletion, unspecified: Secondary | ICD-10-CM

## 2019-03-23 DIAGNOSIS — M6282 Rhabdomyolysis: Secondary | ICD-10-CM | POA: Diagnosis present

## 2019-03-23 DIAGNOSIS — Z23 Encounter for immunization: Secondary | ICD-10-CM | POA: Diagnosis present

## 2019-03-23 DIAGNOSIS — T796XXD Traumatic ischemia of muscle, subsequent encounter: Secondary | ICD-10-CM | POA: Diagnosis not present

## 2019-03-23 DIAGNOSIS — W1830XA Fall on same level, unspecified, initial encounter: Secondary | ICD-10-CM | POA: Diagnosis present

## 2019-03-23 DIAGNOSIS — Z7984 Long term (current) use of oral hypoglycemic drugs: Secondary | ICD-10-CM | POA: Diagnosis not present

## 2019-03-23 DIAGNOSIS — Z20828 Contact with and (suspected) exposure to other viral communicable diseases: Secondary | ICD-10-CM | POA: Diagnosis present

## 2019-03-23 DIAGNOSIS — I714 Abdominal aortic aneurysm, without rupture: Secondary | ICD-10-CM | POA: Diagnosis present

## 2019-03-23 DIAGNOSIS — E119 Type 2 diabetes mellitus without complications: Secondary | ICD-10-CM | POA: Diagnosis present

## 2019-03-23 DIAGNOSIS — M25552 Pain in left hip: Secondary | ICD-10-CM

## 2019-03-23 DIAGNOSIS — Z66 Do not resuscitate: Secondary | ICD-10-CM | POA: Diagnosis present

## 2019-03-23 DIAGNOSIS — E860 Dehydration: Secondary | ICD-10-CM | POA: Diagnosis present

## 2019-03-23 DIAGNOSIS — W19XXXA Unspecified fall, initial encounter: Secondary | ICD-10-CM

## 2019-03-23 DIAGNOSIS — E785 Hyperlipidemia, unspecified: Secondary | ICD-10-CM | POA: Diagnosis present

## 2019-03-23 DIAGNOSIS — L89312 Pressure ulcer of right buttock, stage 2: Secondary | ICD-10-CM | POA: Diagnosis present

## 2019-03-23 DIAGNOSIS — I480 Paroxysmal atrial fibrillation: Secondary | ICD-10-CM | POA: Diagnosis not present

## 2019-03-23 DIAGNOSIS — Y92009 Unspecified place in unspecified non-institutional (private) residence as the place of occurrence of the external cause: Secondary | ICD-10-CM | POA: Diagnosis not present

## 2019-03-23 DIAGNOSIS — Z79899 Other long term (current) drug therapy: Secondary | ICD-10-CM | POA: Diagnosis not present

## 2019-03-23 DIAGNOSIS — Z8249 Family history of ischemic heart disease and other diseases of the circulatory system: Secondary | ICD-10-CM | POA: Diagnosis not present

## 2019-03-23 LAB — GLUCOSE, CAPILLARY
Glucose-Capillary: 156 mg/dL — ABNORMAL HIGH (ref 70–99)
Glucose-Capillary: 200 mg/dL — ABNORMAL HIGH (ref 70–99)
Glucose-Capillary: 204 mg/dL — ABNORMAL HIGH (ref 70–99)
Glucose-Capillary: 218 mg/dL — ABNORMAL HIGH (ref 70–99)

## 2019-03-23 LAB — BASIC METABOLIC PANEL WITH GFR
Anion gap: 12 (ref 5–15)
BUN: 19 mg/dL (ref 8–23)
CO2: 23 mmol/L (ref 22–32)
Calcium: 8.6 mg/dL — ABNORMAL LOW (ref 8.9–10.3)
Chloride: 100 mmol/L (ref 98–111)
Creatinine, Ser: 0.92 mg/dL (ref 0.61–1.24)
GFR calc Af Amer: >60 mL/min
GFR calc non Af Amer: >60 mL/min
Glucose, Bld: 180 mg/dL — ABNORMAL HIGH (ref 70–99)
Potassium: 3.6 mmol/L (ref 3.5–5.1)
Sodium: 135 mmol/L (ref 135–145)

## 2019-03-23 LAB — CBC
HCT: 36.9 % — ABNORMAL LOW (ref 39.0–52.0)
Hemoglobin: 12 g/dL — ABNORMAL LOW (ref 13.0–17.0)
MCH: 31.3 pg (ref 26.0–34.0)
MCHC: 32.5 g/dL (ref 30.0–36.0)
MCV: 96.1 fL (ref 80.0–100.0)
Platelets: 233 10*3/uL (ref 150–400)
RBC: 3.84 MIL/uL — ABNORMAL LOW (ref 4.22–5.81)
RDW: 14 % (ref 11.5–15.5)
WBC: 11.2 10*3/uL — ABNORMAL HIGH (ref 4.0–10.5)
nRBC: 0 % (ref 0.0–0.2)

## 2019-03-23 LAB — HEMOGLOBIN A1C
Hgb A1c MFr Bld: 7.2 % — ABNORMAL HIGH (ref 4.8–5.6)
Mean Plasma Glucose: 159.94 mg/dL

## 2019-03-23 LAB — URINE CULTURE: Culture: NO GROWTH

## 2019-03-23 LAB — CK: Total CK: 880 U/L — ABNORMAL HIGH (ref 49–397)

## 2019-03-23 MED ORDER — SODIUM CHLORIDE 0.9 % IV SOLN
INTRAVENOUS | Status: AC
  Administered 2019-03-23 – 2019-03-24 (×3): via INTRAVENOUS

## 2019-03-23 MED ORDER — HYDROCODONE-ACETAMINOPHEN 5-325 MG PO TABS: 1.0000 | ORAL_TABLET | Freq: Four times a day (QID) | ORAL | Status: DC | PRN

## 2019-03-23 MED ORDER — ACETAMINOPHEN 325 MG PO TABS
650.0000 mg | ORAL_TABLET | Freq: Four times a day (QID) | ORAL | Status: DC | PRN
  Administered 2019-03-23 – 2019-03-28 (×5): 650 mg via ORAL
  Filled 2019-03-23 (×5): qty 2

## 2019-03-23 NOTE — Progress Notes (Signed)
Wife notified Transfer of pt to telemetry. Pt just transferred via bed with NT x2 to room 1402.

## 2019-03-23 NOTE — Progress Notes (Signed)
PROGRESS NOTE  Omar Gomez [id number] DOB: June 09, 1924 DOA: 03/22/2019 PCP: Wenda Low, MD  Brief History    Omar Gomez is a 85 y.o. male with medical history significant of type 2 diabetes, hypertension, prostate cancer admitted with multiple falls at home.  Wife reports that he has generalized weakness which is gotten worse and he was on the floor for 4 hours and she called the police to get him out of the floor after the police left patient fell again on the floor.  And then remained on the floor for 4 hours before he called EMS.  She denied any fever chills cough shortness of breath nausea vomiting diarrhea chest pain shortness of breath or loss of consciousness or urinary complaints.  However she reports after getting treatment for prostate cancer in October 2019 he has progressive decline.  Denies any sick contacts.  Patient has very minimal p.o. intake refuses to eat or drink.    ED Course: Sodium 136 potassium 3.7 BUN 24 creatinine 1.09 CPK was 1030.  White count 10 hemoglobin 11.6 platelet count 234.  Covid pending.  UA negative for any signs of infection.  X-ray lumbar spine no evidence of acute fracture.  However he has an abdominal aortic aneurysm 6.2 cm.  X-ray of the pelvis no evidence of acute fracture.  CT head no acute hemorrhage age related atrophy and chronic microvascular changes CT of the cervical spine no acute findings  The patient was admitted to a medical bed. He was given IV hydration. His CK was followed. AAA was evaluated and was found to be 6.4. Antihypertensives were held due to low blood pressures. HCTZ was held for hypovolemia. PT/OT have evaluated the patient. They feel that the patient is appropriate for SNF placement as he is requiring assist of 2 for transfers and standing.   Consultants  . None  Procedures  . None  Antibiotics  . None  Interval History/Subjective  Pt is resting comfortably. He is without new complaints.  Objective    Vitals:  Vitals:   03/23/19 0522 03/23/19 1419  BP: 138/67 (!) 120/58  Pulse: 85 85  Resp: 14 18  Temp: 100.1 F (37.8 C) 99 F (37.2 C)  SpO2: 98% 96%    Exam:  Exam:  Constitutional:  . The patient is awake, alert, and oriented x 3. No acute distress. Respiratory:  . No increased work of breathing. . No wheezes, rales, or rhonchi . No tactile fremitus Cardiovascular:  . Regular rate and rhythm . No murmurs, ectopy, or gallups. . No lateral PMI. No thrills. Abdomen:  . Abdomen is soft, non-tender, non-distended . No hernias, masses, or organomegaly . Normoactive bowel sounds.  Musculoskeletal:  . No cyanosis, clubbing, or edema Skin:  . No rashes, lesions, ulcers . palpation of skin: no induration or nodules Neurologic:  . CN 2-12 intact . Sensation all 4 extremities intact Psychiatric:  . Mental status Mood, affect congruent  I have personally reviewed the following:   Today's Data  . Vitals, CBC, BMP, CK  Imaging  . X-ray of the pelvis  Scheduled Meds: . enoxaparin (LOVENOX) injection  40 mg Subcutaneous Q24H  . insulin aspart  0-15 Units Subcutaneous TID WC   Continuous Infusions: . sodium chloride 100 mL/hr at 03/23/19 1109    Active Problems:   Fall   Rhabdomyolysis   LOS: 0 days    A & P   Multiple falls-patient with failure to thrive: The patient has decreased p.o. intake  resulting in  Dehydration. He has had multiple falls and lives at home with his wife.  Wife does not want him to go to any rehab.  She is planning to get 24-hour help for him at home.  She has 2 daughters who lives in [place] and multiple grandchildren who can help her.  She just wants them hydrated and sending back home with therapy and with 24-hour care. However PT is stressing that the patient is requiring too much care to go home.  Rhabdomyolysis: Traumatic due to falls. CK is improved from 1030 to 880. Continue IV fluid resuscitation.   Volume depletion due to  poor PO intake and HCTZ for antihypertensive.   Abdominal aortic aneurysm: As of December 2019 it was 5.4 cm. As of evaluation on admission it is now up to 6.4 cm. This is followed by Dr. Oneida Alar.  Essential hypertension: The patient's blood pressures on admission were initially high, but have been normotensive since admission.   Antihypertensives have been held. HCTZ should be stopped.  Hyperlipidemia: The patient will be restarted on Lipitor and Zetia.  DM II: The patient is on Glucophage at home. HbA1c is 7.2. His glucoses will be followed with SSI while in hospital.  Pain in left hip. X-ray of pelvis negative. Will CT hip to rule out fracture.  I have seen and examined this patient myself. I have spent 35 minutes in his evaluation and care.  DVT prophylaxis: Lovenox Code Status: DNR Family Communication: Discussed with wife Disposition Plan: Pending hydration and PT eval  Christropher Gintz, DO [facility] Direct contact: see [email or web address]  7PM-7AM contact night coverage as above 03/23/2019, 3:37 PM  LOS: 0 days

## 2019-03-23 NOTE — Progress Notes (Signed)
Report given to Alexis, RN

## 2019-03-23 NOTE — Evaluation (Signed)
Physical Therapy Evaluation Patient Details Name: Omar Gomez MRN: [id number] DOB: [birth year] Today's Date: 03/23/2019   History of Present Illness  Omar Gomez is a 85 y.o. male with medical history significant of type 2 diabetes, hypertension, prostate cancer admitted with multiple falls at home and on floor for several hours.  Clinical Impression  Pt presented with significantly decreased mobility and strength requiring assist of 2 for transfers and to stand.  He was too unstable and confused to be left in chair.  Additionally, pt with L LE pain with transfers - this appears to be acute on chronic pain.  Pt with history of falls.  He lives with his wife who is unable to provide level of assist required.  Will benefit from acute PT services.     Follow Up Recommendations SNF    Equipment Recommendations  None recommended by PT    Recommendations for Other Services       Precautions / Restrictions Precautions Precautions: Fall Restrictions Weight Bearing Restrictions: No      Mobility  Bed Mobility Overal bed mobility: Needs Assistance Bed Mobility: Rolling;Supine to Sit Rolling: Max assist;+2 for physical assistance   Supine to sit: Max assist;+2 for physical assistance;+2 for safety/equipment     General bed mobility comments: verbally and visually cued for technique  Transfers Overall transfer level: Needs assistance Equipment used: Rolling walker (2 wheeled) Transfers: Sit to/from Stand Sit to Stand: +2 physical assistance;Max assist         General transfer comment: Pt's knees buckling and c/o pain in standing.  Pt c/o L groin pain with transfers.  Wife reports he has some pain at baseline. Performed standing 3 x with bed elevated and assist of 2.  Pt unable to take steps and unsafe to leave in chair.  Ambulation/Gait                Stairs            Wheelchair Mobility    Modified Rankin (Stroke Patients Only)       Balance  Overall balance assessment: Needs assistance Sitting-balance support: Bilateral upper extremity supported Sitting balance-Leahy Scale: Fair     Standing balance support: Bilateral upper extremity supported Standing balance-Leahy Scale: Zero                               Pertinent Vitals/Pain Pain Assessment: Faces Faces Pain Scale: Hurts even more Pain Location: L groin Pain Descriptors / Indicators: Moaning;Grimacing(Pain symptoms with activity; no signs of pain at rest) Pain Intervention(s): Limited activity within patient's tolerance;Relaxation    Home Living Family/patient expects to be discharged to:: Private residence Living Arrangements: Spouse/significant other Available Help at Discharge: Available PRN/intermittently Type of Home: House Home Access: Stairs to enter Entrance Stairs-Rails: None Entrance Stairs-Number of Steps: 2 Home Layout: One level Home Equipment: Environmental consultant - 2 wheels;Transport chair      Prior Function Level of Independence: Needs assistance   Gait / Transfers Assistance Needed: Min A with transfers at home; wife reports steady decline over the past few weeks; pt with 2 recent falls; uses RW  ADL's / Homemaking Assistance Needed: mod-max A from wife        Hand Dominance        Extremity/Trunk Assessment   Upper Extremity Assessment Upper Extremity Assessment: Generalized weakness;Difficult to assess due to impaired cognition(unable to follow MMT commands)    Lower Extremity Assessment Lower Extremity  Assessment: Difficult to assess due to impaired cognition;Generalized weakness(bil knee with genu varus; tends to externally rotate L hip at rest)       Communication   Communication: HOH  Cognition Arousal/Alertness: Awake/alert Behavior During Therapy: Anxious Overall Cognitive Status: Impaired/Different from baseline Area of Impairment: Orientation;Safety/judgement;Awareness                 Orientation Level:  Person       Safety/Judgement: Decreased awareness of safety     General Comments: Limited by dementia and HOH; wife present and reports more confused than normal      General Comments General comments (skin integrity, edema, etc.): RN reports pt had been c/o L LE pain and keeps externally rotated.  Pt did have pain with transfers throughout L LE, but was able to bear some weight.  Wife reports pt with chronic groin pain.    Exercises     Assessment/Plan    PT Assessment Patient needs continued PT services  PT Problem List Decreased strength;Decreased mobility;Decreased safety awareness;Decreased range of motion;Decreased activity tolerance;Decreased cognition;Decreased balance;Pain       PT Treatment Interventions Therapeutic exercise;Balance training;Neuromuscular re-education;Functional mobility training;Therapeutic activities;Patient/family education;Gait training;DME instruction    PT Goals (Current goals can be found in the Care Plan section)  Acute Rehab PT Goals Patient Stated Goal: Pt unable; Wife agreeable to SNF after seeing pt's mobility PT Goal Formulation: With family Time For Goal Achievement: 04/06/19 Potential to Achieve Goals: Fair    Frequency Min 2X/week   Barriers to discharge Decreased caregiver support wife unable to provide max A    Co-evaluation               AM-PAC PT "6 Clicks" Mobility  Outcome Measure Help needed turning from your back to your side while in a flat bed without using bedrails?: Total Help needed moving from lying on your back to sitting on the side of a flat bed without using bedrails?: Total Help needed moving to and from a bed to a chair (including a wheelchair)?: Total Help needed standing up from a chair using your arms (e.g., wheelchair or bedside chair)?: Total Help needed to walk in hospital room?: Total Help needed climbing 3-5 steps with a railing? : Total 6 Click Score: 6    End of Session Equipment  Utilized During Treatment: Gait belt Activity Tolerance: Patient limited by pain;Patient limited by fatigue Patient left: in bed;with family/visitor present;with call bell/phone within reach;with bed alarm set Nurse Communication: Mobility status;Other (comment)(need for rehab at d/c) PT Visit Diagnosis: Repeated falls (R29.6);Muscle weakness (generalized) (M62.81);Unsteadiness on feet (R26.81)    Time: 1135-1200 PT Time Calculation (min) (ACUTE ONLY): 25 min   Charges:   PT Evaluation $PT Eval Moderate Complexity: 1 Mod PT Treatments $Therapeutic Activity: 8-22 mins        Maggie Font, PT Acute Rehab Services [phone]   Karlton Lemon 03/23/2019, 1:02 PM

## 2019-03-23 NOTE — Progress Notes (Signed)
Dr Benny Lennert aware pt"s wife concerned pt not on home meds especially BP meds. Orders received to transfer to telemetry.

## 2019-03-24 DIAGNOSIS — I480 Paroxysmal atrial fibrillation: Secondary | ICD-10-CM

## 2019-03-24 LAB — BASIC METABOLIC PANEL
Anion gap: 10 (ref 5–15)
Anion gap: 9 (ref 5–15)
BUN: 18 mg/dL (ref 8–23)
BUN: 20 mg/dL (ref 8–23)
CO2: 23 mmol/L (ref 22–32)
CO2: 21 mmol/L — ABNORMAL LOW (ref 22–32)
Calcium: 8.4 mg/dL — ABNORMAL LOW (ref 8.9–10.3)
Calcium: 8.2 mg/dL — ABNORMAL LOW (ref 8.9–10.3)
Chloride: 103 mmol/L (ref 98–111)
Chloride: 103 mmol/L (ref 98–111)
Creatinine, Ser: 0.85 mg/dL (ref 0.61–1.24)
Creatinine, Ser: 0.87 mg/dL (ref 0.61–1.24)
GFR calc Af Amer: >60 mL/min (ref >60)
GFR calc Af Amer: >60 mL/min (ref >60)
GFR calc non Af Amer: >60 mL/min (ref >60)
GFR calc non Af Amer: >60 mL/min (ref >60)
Glucose, Bld: 144 mg/dL — ABNORMAL HIGH (ref 70–99)
Glucose, Bld: 164 mg/dL — ABNORMAL HIGH (ref 70–99)
Potassium: 3.1 mmol/L — ABNORMAL LOW (ref 3.5–5.1)
Potassium: 3.6 mmol/L (ref 3.5–5.1)
Sodium: 136 mmol/L (ref 135–145)
Sodium: 133 mmol/L — ABNORMAL LOW (ref 135–145)

## 2019-03-24 LAB — GLUCOSE, CAPILLARY
Glucose-Capillary: 118 mg/dL — ABNORMAL HIGH (ref 70–99)
Glucose-Capillary: 260 mg/dL — ABNORMAL HIGH (ref 70–99)
Glucose-Capillary: 105 mg/dL — ABNORMAL HIGH (ref 70–99)
Glucose-Capillary: 165 mg/dL — ABNORMAL HIGH (ref 70–99)

## 2019-03-24 LAB — CK: Total CK: 321 U/L (ref 49–397)

## 2019-03-24 MED ORDER — ATORVASTATIN CALCIUM 40 MG PO TABS
80.0000 mg | ORAL_TABLET | Freq: Every day | ORAL | Status: DC
  Administered 2019-03-24 – 2019-03-28 (×5): 80 mg via ORAL
  Filled 2019-03-24 (×5): qty 2

## 2019-03-24 MED ORDER — POTASSIUM CHLORIDE 10 MEQ/100ML IV SOLN
10.0000 meq | INTRAVENOUS | Status: AC
  Administered 2019-03-24 (×4): 10 meq via INTRAVENOUS
  Filled 2019-03-24 (×4): qty 100

## 2019-03-24 MED ORDER — ASPIRIN EC 81 MG PO TBEC
81.0000 mg | DELAYED_RELEASE_TABLET | ORAL | Status: DC
  Administered 2019-03-24 – 2019-03-28 (×3): 81 mg via ORAL
  Filled 2019-03-24 (×3): qty 1

## 2019-03-24 MED ORDER — ATENOLOL 25 MG PO TABS
25.0000 mg | ORAL_TABLET | Freq: Every day | ORAL | Status: DC
  Administered 2019-03-25 – 2019-03-28 (×4): 25 mg via ORAL
  Filled 2019-03-24 (×5): qty 1

## 2019-03-24 MED ORDER — EZETIMIBE 10 MG PO TABS
10.0000 mg | ORAL_TABLET | Freq: Every day | ORAL | Status: DC
  Administered 2019-03-24 – 2019-03-28 (×5): 10 mg via ORAL
  Filled 2019-03-24 (×5): qty 1

## 2019-03-24 NOTE — NC FL2 (Signed)
[place] LEVEL OF CARE SCREENING TOOL     IDENTIFICATION  Patient Name: Omar Gomez Birthdate: [birth year] Sex: male Admission Date (Current Location): 03/22/2019  [facility] and [place] Number:  Herbalist and Address:  [facility],  [place] [address] [place]      Provider Number: [id number]  Attending Physician Name and Address:  Karie Kirks, DO  Relative Name and Phone Number:  E[place] [phone]    Current Level of Care: Hospital Recommended Level of Care: [place] Prior Approval Number:    Date Approved/Denied:   PASRR Number:    Discharge Plan: SNF    Current Diagnoses: Patient Active Problem List   Diagnosis Date Noted  . Rhabdomyolysis 03/23/2019  . Fall 03/22/2019  . AAA (abdominal aortic aneurysm) without rupture ([place]) 10/12/2013    Orientation RESPIRATION BLADDER Height & Weight     Self  Normal Incontinent, External catheter Weight: 98 kg Height:  5\' 7"  (170.2 cm)  BEHAVIORAL SYMPTOMS/MOOD NEUROLOGICAL BOWEL NUTRITION STATUS      Incontinent Diet(Regular)  AMBULATORY STATUS COMMUNICATION OF NEEDS Skin   Limited Assist Verbally Skin abrasions([place])                       Personal Care Assistance Level of Assistance  Bathing, Feeding, Dressing Bathing Assistance: Limited assistance Feeding assistance: Limited assistance Dressing Assistance: Limited assistance     Functional Limitations Info  Sight, Hearing, Speech Sight Info: Impaired(eyeglasses) Hearing Info: Impaired(HOH) Speech Info: Adequate    SPECIAL CARE FACTORS FREQUENCY                       Contractures Contractures Info: Not present    Additional Factors Info  Code Status, Insulin Sliding Scale Code Status Info: DNR     Insulin Sliding Scale Info: SSI       Current Medications (03/24/2019):  This is the current hospital active medication list Current Facility-Administered Medications   Medication Dose Route Frequency Provider Last Rate Last Dose  . 0.9 %  sodium chloride infusion   Intravenous Continuous Swayze, Ava, DO 50 mL/hr at 03/24/19 1536    . acetaminophen (TYLENOL) tablet 650 mg  650 mg Oral Q6H PRN Swayze, Ava, DO   650 mg at 03/23/19 0626  . aspirin EC tablet 81 mg  81 mg Oral QODAY Swayze, Ava, DO   81 mg at 03/24/19 1037  . atenolol (TENORMIN) tablet 25 mg  25 mg Oral Daily Swayze, Ava, DO      . atorvastatin (LIPITOR) tablet 80 mg  80 mg Oral Daily Swayze, Ava, DO   80 mg at 03/24/19 1037  . enoxaparin (LOVENOX) injection 40 mg  40 mg Subcutaneous Q24H Swayze, Ava, DO   40 mg at 03/23/19 2113  . ezetimibe (ZETIA) tablet 10 mg  10 mg Oral Daily Swayze, Ava, DO   10 mg at 03/24/19 1037  . HYDROcodone-acetaminophen (NORCO/VICODIN) 5-325 MG per tablet 1-2 tablet  1-2 tablet Oral Q6H PRN Swayze, Ava, DO      . insulin aspart (novoLOG) injection 0-15 Units  0-15 Units Subcutaneous TID WC Swayze, Ava, DO   8 Units at 03/24/19 1240  . potassium chloride 10 mEq in 100 mL IVPB  10 mEq Intravenous Q1 Hr x 4 Swayze, Ava, DO         Discharge Medications: Please see discharge summary for a list of discharge medications.  Relevant  Imaging Results:  Relevant Lab Results:   Additional Information [id number], Juliann Pulse, [place]

## 2019-03-24 NOTE — Progress Notes (Signed)
PROGRESS NOTE  Omar Gomez [id number] DOB: May 17, 1925 DOA: 03/22/2019 PCP: Wenda Low, MD  Brief History    Omar Gomez is a 85 y.o. male with medical history significant of type 2 diabetes, hypertension, prostate cancer admitted with multiple falls at home.  Wife reports that he has generalized weakness which is gotten worse and he was on the floor for 4 hours and she called the police to get him out of the floor after the police left patient fell again on the floor.  And then remained on the floor for 4 hours before he called EMS.  She denied any fever chills cough shortness of breath nausea vomiting diarrhea chest pain shortness of breath or loss of consciousness or urinary complaints.  However she reports after getting treatment for prostate cancer in October 2019 he has progressive decline.  Denies any sick contacts.  Patient has very minimal p.o. intake refuses to eat or drink.  ED Course: Sodium 136 potassium 3.7 BUN 24 creatinine 1.09 CPK was 1030.  White count 10 hemoglobin 11.6 platelet count 234.  Covid pending.  UA negative for any signs of infection.  X-ray lumbar spine no evidence of acute fracture.  However he has an abdominal aortic aneurysm 6.2 cm.  X-ray of the pelvis no evidence of acute fracture.  CT head no acute hemorrhage age related atrophy and chronic microvascular changes CT of the cervical spine no acute findings  The patient was admitted to a medical bed. He was given IV hydration. His CK was followed. AAA was evaluated and was found to be 6.4. Antihypertensives were held due to low blood pressures. HCTZ was held for hypovolemia. PT/OT have evaluated the patient. They feel that the patient is appropriate for SNF placement as he is requiring assist of 2 for transfers and standing.   Consultants  . None  Procedures  . None  Antibiotics  . None  Interval History/Subjective  Pt is resting comfortably. He is without new complaints.  Objective    Vitals:  Vitals:   03/24/19 1033 03/24/19 1355  BP: (!) 111/49 122/65  Pulse: 88 90  Resp:  16  Temp:  99 F (37.2 C)  SpO2: 95% 98%   Exam:  Constitutional:  . The patient is awake, alert, and oriented x 3. No acute distress. Respiratory:  . No increased work of breathing. . No wheezes, rales, or rhonchi . No tactile fremitus Cardiovascular:  . Regular rate and rhythm. . No murmurs, ectopy, or gallups. . No lateral PMI. No thrills. Abdomen:  . Abdomen is soft, non-tender, non-distended. . No hernias, masses, or organomegaly. . Normoactive bowel sounds.  Musculoskeletal:  . No cyanosis, clubbing, or edema. Skin:  . No rashes, lesions, ulcers. . palpation of skin: no induration or nodules. Neurologic:  . CN 2-12 intact. . Sensation all 4 extremities intact. Psychiatric:  . Mental status Mood, affect congruent  I have personally reviewed the following:   Today's Data  . Vitals, CBC, BMP, CK  Imaging  . X-ray of the pelvis  Scheduled Meds: . aspirin EC  81 mg Oral QODAY  . atenolol  25 mg Oral Daily  . atorvastatin  80 mg Oral Daily  . enoxaparin (LOVENOX) injection  40 mg Subcutaneous Q24H  . ezetimibe  10 mg Oral Daily  . insulin aspart  0-15 Units Subcutaneous TID WC   Continuous Infusions: . sodium chloride 100 mL/hr at 03/24/19 0451    Active Problems:   Fall   Rhabdomyolysis  LOS: 1 day    A & P   Multiple falls-patient with failure to thrive: The patient has decreased p.o. intake resulting in  Dehydration. He has had multiple falls and lives at home with his wife.  Wife does not want him to go to any rehab.  She is planning to get 24-hour help for him at home.  She has 2 daughters who lives in [place] and multiple grandchildren who can help her.  She just wants them hydrated and sending back home with therapy and with 24-hour care. However PT is stressing that the patient is requiring too much care to go home.  Rhabdomyolysis: Traumatic  due to falls. CK is improved from 1030 to 880 to 321 today. Will cut back on IV fluid resuscitation.   Volume depletion due to poor PO intake and HCTZ for antihypertensive.   Abdominal aortic aneurysm: As of December 2019 it was 5.4 cm. As of evaluation on admission it is now up to 6.4 cm. This is followed by Dr. Oneida Alar.  Essential hypertension: The patient's blood pressures on admission were initially high, but have been normotensive since admission.  I have restarted atenolol this morning due to high BP and report of AF from telemetry. Will not restart HCTZ.  Hyperlipidemia: The patient will be restarted on Lipitor and Zetia.  DM II: The patient is on Glucophage at home. HbA1c is 7.2. His glucoses will be followed with SSI while in hospital.  Pain in left hip. X-ray of pelvis negative. CT of the left hip is negative for fracture.  I have seen and examined this patient myself. I have spent 30 minutes in his evaluation and care.  DVT prophylaxis: Lovenox Code Status: DNR Family Communication: Discussed with wife Disposition Plan: Pending hydration and PT eval  Antione Obar, DO [facility] Direct contact: see [email or web address]  7PM-7AM contact night coverage as above 03/24/2019, 2:17 PM  LOS: 0 days

## 2019-03-24 NOTE — TOC Initial Note (Signed)
Transition of Care [facility]) - Initial/Assessment Note    Patient Details  Name: Omar Gomez MRN: [id number] Date of Birth: 1925-01-02  [facility] CM/SW Contact:    Dessa Phi, RN Phone Number: 03/24/2019, 3:44 PM  Clinical Narrative: Patient w/dementia-spoke to spouse rosemarie agree to PT recc SNF, & faxing out to SNF-await bed offers. Will need to get auth, & covid within 48hrs of d/c.                  Expected Discharge Plan: Skilled Nursing Facility Barriers to Discharge: Continued Medical Work up   Patient Goals and CMS Choice Patient states their goals for this hospitalization and ongoing recovery are:: go to rehab CMS Medicare.gov Compare Post Acute Care list provided to:: Patient Represenative (must comment) Choice offered to / list presented to : Spouse  Expected Discharge Plan and Services Expected Discharge Plan: [place]   Discharge Planning Services: CM Consult   Living arrangements for the past 2 months: Single Family Home                                      Prior Living Arrangements/Services Living arrangements for the past 2 months: Single Family Home Lives with:: Spouse Patient language and need for interpreter reviewed:: Yes Do you feel safe going back to the place where you live?: Yes      Need for Family Participation in Patient Care: Yes (Comment) Care giver support system in place?: Yes (comment)   Criminal Activity/Legal Involvement Pertinent to Current Situation/Hospitalization: No - Comment as needed  Activities of Daily Living Home Assistive Devices/Equipment: Eyeglasses, Environmental consultant (specify type)(front wheeled walker) ADL Screening (condition at time of admission) Patient's cognitive ability adequate to safely complete daily activities?: No Is the patient deaf or have difficulty hearing?: Yes(slight hoh) Does the patient have difficulty seeing, even when wearing glasses/contacts?: Yes Does the patient  have difficulty concentrating, remembering, or making decisions?: Yes Patient able to express need for assistance with ADLs?: No Does the patient have difficulty dressing or bathing?: Yes Independently performs ADLs?: No Communication: Independent Dressing (OT): Needs assistance Is this a change from baseline?: Change from baseline, expected to last >3 days Grooming: Needs assistance Is this a change from baseline?: Change from baseline, expected to last >3 days Feeding: Needs assistance Is this a change from baseline?: Change from baseline, expected to last >3 days Bathing: Needs assistance Is this a change from baseline?: Change from baseline, expected to last >3 days Toileting: Dependent Is this a change from baseline?: Change from baseline, expected to last >3days In/Out Bed: Dependent Is this a change from baseline?: Change from baseline, expected to last >3 days Walks in Home: Dependent Is this a change from baseline?: Change from baseline, expected to last >3 days Does the patient have difficulty walking or climbing stairs?: Yes(secondary to weakness) Weakness of Legs: Both Weakness of Arms/Hands: None  Permission Sought/Granted Permission sought to share information with : Case Manager Permission granted to share information with : Yes, Verbal Permission Granted  Share Information with NAMEJuliann Pulse RNCM [id number]  Permission granted to share info w AGENCY: SNF     Permission granted to share info w Contact Information: Rosemarie spouse [phone]  Emotional Assessment Appearance:: Appears stated age Attitude/Demeanor/Rapport: Gracious Affect (typically observed): Accepting Orientation: : Oriented to Self Alcohol / Substance Use: Not Applicable Psych Involvement: No (  comment)  Admission diagnosis:  Fall, initial encounter [W19.XXXA] Traumatic rhabdomyolysis, initial encounter ([place]) [T79.6XXA] Patient Active Problem List   Diagnosis Date Noted  . Rhabdomyolysis  03/23/2019  . Fall 03/22/2019  . AAA (abdominal aortic aneurysm) without rupture ([place]) 10/12/2013   PCP:  Wenda Low, MD Pharmacy:   [facility] [place], [place] [place] [id number] Phone: [phone] Fax: [phone]     Social Determinants of Health (SDOH) Interventions    Readmission Risk Interventions No flowsheet data found.

## 2019-03-25 DIAGNOSIS — L8990 Pressure ulcer of unspecified site, unspecified stage: Secondary | ICD-10-CM | POA: Diagnosis present

## 2019-03-25 LAB — BASIC METABOLIC PANEL
Anion gap: 11 (ref 5–15)
BUN: 18 mg/dL (ref 8–23)
CO2: 21 mmol/L — ABNORMAL LOW (ref 22–32)
Calcium: 8.6 mg/dL — ABNORMAL LOW (ref 8.9–10.3)
Chloride: 104 mmol/L (ref 98–111)
Creatinine, Ser: 0.81 mg/dL (ref 0.61–1.24)
GFR calc Af Amer: >60 mL/min (ref >60)
GFR calc non Af Amer: >60 mL/min (ref >60)
Glucose, Bld: 155 mg/dL — ABNORMAL HIGH (ref 70–99)
Potassium: 3.1 mmol/L — ABNORMAL LOW (ref 3.5–5.1)
Sodium: 136 mmol/L (ref 135–145)

## 2019-03-25 LAB — GLUCOSE, CAPILLARY
Glucose-Capillary: 139 mg/dL — ABNORMAL HIGH (ref 70–99)
Glucose-Capillary: 163 mg/dL — ABNORMAL HIGH (ref 70–99)
Glucose-Capillary: 131 mg/dL — ABNORMAL HIGH (ref 70–99)
Glucose-Capillary: 176 mg/dL — ABNORMAL HIGH (ref 70–99)

## 2019-03-25 MED ORDER — POTASSIUM CHLORIDE 10 MEQ/100ML IV SOLN
10.0000 meq | INTRAVENOUS | Status: AC
  Administered 2019-03-25 (×4): 10 meq via INTRAVENOUS
  Filled 2019-03-25 (×4): qty 100

## 2019-03-25 NOTE — Progress Notes (Signed)
PROGRESS NOTE  Omar Gomez [id number] DOB: 06/15/1924 DOA: 03/22/2019 PCP: Wenda Low, MD  Brief History    Omar Gomez is a 85 y.o. male with medical history significant of type 2 diabetes, hypertension, prostate cancer admitted with multiple falls at home.  Wife reports that he has generalized weakness which is gotten worse and he was on the floor for 4 hours and she called the police to get him out of the floor after the police left patient fell again on the floor.  And then remained on the floor for 4 hours before he called EMS.  She denied any fever chills cough shortness of breath nausea vomiting diarrhea chest pain shortness of breath or loss of consciousness or urinary complaints.  However she reports after getting treatment for prostate cancer in October 2019 he has progressive decline.  Denies any sick contacts.  Patient has very minimal p.o. intake refuses to eat or drink.  ED Course: Sodium 136 potassium 3.7 BUN 24 creatinine 1.09 CPK was 1030.  White count 10 hemoglobin 11.6 platelet count 234.  Covid pending.  UA negative for any signs of infection.  X-ray lumbar spine no evidence of acute fracture.  However he has an abdominal aortic aneurysm 6.2 cm.  X-ray of the pelvis no evidence of acute fracture.  CT head no acute hemorrhage age related atrophy and chronic microvascular changes CT of the cervical spine no acute findings  The patient was admitted to a medical bed. He was given IV hydration. His CK was followed. AAA was evaluated and was found to be 6.4. Antihypertensives were held due to low blood pressures. HCTZ was held for hypovolemia. PT/OT have evaluated the patient. They feel that the patient is appropriate for SNF placement as he is requiring assist of 2 for transfers and standing.   Consultants  . None  Procedures  . None  Antibiotics  . None  Interval History/Subjective  Pt is resting comfortably. He is without new complaints.  Objective    Vitals:  Vitals:   03/25/19 0700 03/25/19 1309  BP: (!) 147/60 119/61  Pulse: 80 62  Resp: 14 16  Temp: 98.7 F (37.1 C) 98.1 F (36.7 C)  SpO2: 98% 99%   Exam:  Constitutional:  . The patient is awake, alert, and oriented x 3. No acute distress. Respiratory:  . No increased work of breathing. . No wheezes, rales, or rhonchi . No tactile fremitus Cardiovascular:  . Regular rate and rhythm. . No murmurs, ectopy, or gallups. . No lateral PMI. No thrills. Abdomen:  . Abdomen is soft, non-tender, non-distended. . No hernias, masses, or organomegaly. . Normoactive bowel sounds.  Musculoskeletal:  . No cyanosis, clubbing, or edema. Skin:  . No rashes, lesions, ulcers. . palpation of skin: no induration or nodules. Neurologic:  . CN 2-12 intact. . Sensation all 4 extremities intact. Psychiatric:  . Mental status Mood, affect congruent  I have personally reviewed the following:   Today's Data  . Vitals, BMP  Imaging  . X-ray of the pelvis  Scheduled Meds: . aspirin EC  81 mg Oral QODAY  . atenolol  25 mg Oral Daily  . atorvastatin  80 mg Oral Daily  . enoxaparin (LOVENOX) injection  40 mg Subcutaneous Q24H  . ezetimibe  10 mg Oral Daily  . insulin aspart  0-15 Units Subcutaneous TID WC   Continuous Infusions:   Active Problems:   Fall   Rhabdomyolysis   Pressure injury of skin  LOS: 2 days    A & P   Multiple falls-patient with failure to thrive: The patient has decreased p.o. intake resulting in  Dehydration. He has had multiple falls and lives at home with his wife.  Wife does not want him to go to any rehab.  She is planning to get 24-hour help for him at home.  She has 2 daughters who lives in [place] and multiple grandchildren who can help her.  She just wants them hydrated and sending back home with therapy and with 24-hour care. However PT is stressing that the patient is requiring too much care to go home.  Rhabdomyolysis: Traumatic due to  falls. CK is improved from 1030 to 880 to 321 today. Will cut back on IV fluid resuscitation.   Volume depletion:  Due to poor PO intake and HCTZ for antihypertensive. Resolved. IV fluids stopped.   Hypokalemia: Supplement and monitor.  Abdominal aortic aneurysm: As of December 2019 it was 5.4 cm. As of evaluation on admission it is now up to 6.4 cm. This is followed by Dr. Oneida Alar.  Essential hypertension: The patient's blood pressures on admission were initially high, but have been normotensive since admission.  I have restarted atenolol this morning due to high BP and report of AF from telemetry. Will not restart HCTZ.  Hyperlipidemia: The patient will be restarted on Lipitor and Zetia.  DM II: The patient is on Glucophage at home. HbA1c is 7.2. His glucoses will be followed with SSI while in hospital. FSBS has been 139 - 165.  Pain in left hip. X-ray of pelvis negative. CT of the left hip is negative for fracture.  I have seen and examined this patient myself. I have spent 32 minutes in his evaluation and care.  DVT prophylaxis: Lovenox Code Status: DNR Family Communication: Discussed with wife Disposition Plan: Pending hydration and PT eval  Surina Storts, DO [facility] Direct contact: see [email or web address]  7PM-7AM contact night coverage as above 03/25/2019, 1:14 PM  LOS: 0 days

## 2019-03-25 NOTE — TOC Progression Note (Signed)
Transition of Care [facility]) - Progression Note    Patient Details  Name: Omar Gomez MRN: [id number] Date of Birth: May 27, 1925  Transition [facility]) CM/SW G[place], LCSW Phone Number: 03/25/2019, 4:01 PM  Clinical Narrative:   CSW following for discharge needs. CSW spoke with patient spouse via phone. CSW went over bed offers with spouse and encouraged her to reach back out to CSW once she has chosen a bed for patient. Spouse stated she will call CSW back in the morning and give her a decision then     Expected Discharge Plan: [place] Barriers to Discharge: Continued Medical Work up  Expected Discharge Plan and Services Expected Discharge Plan: [place]   Discharge Planning Services: CM Consult   Living arrangements for the past 2 months: Single Family Home                                       Social Determinants of Health (SDOH) Interventions    Readmission Risk Interventions No flowsheet data found.

## 2019-03-26 LAB — BASIC METABOLIC PANEL
Anion gap: 7 (ref 5–15)
BUN: 17 mg/dL (ref 8–23)
CO2: 22 mmol/L (ref 22–32)
Calcium: 8 mg/dL — ABNORMAL LOW (ref 8.9–10.3)
Chloride: 101 mmol/L (ref 98–111)
Creatinine, Ser: 0.76 mg/dL (ref 0.61–1.24)
GFR calc Af Amer: >60 mL/min (ref >60)
GFR calc non Af Amer: >60 mL/min (ref >60)
Glucose, Bld: 143 mg/dL — ABNORMAL HIGH (ref 70–99)
Potassium: 3.2 mmol/L — ABNORMAL LOW (ref 3.5–5.1)
Sodium: 130 mmol/L — ABNORMAL LOW (ref 135–145)

## 2019-03-26 LAB — CBC WITH DIFFERENTIAL/PLATELET
Abs Immature Granulocytes: 0.04 10*3/uL (ref 0.00–0.07)
Basophils Absolute: 0 10*3/uL (ref 0.0–0.1)
Basophils Relative: 0 %
Eosinophils Absolute: 0.2 10*3/uL (ref 0.0–0.5)
Eosinophils Relative: 3 %
HCT: 32.2 % — ABNORMAL LOW (ref 39.0–52.0)
Hemoglobin: 10.2 g/dL — ABNORMAL LOW (ref 13.0–17.0)
Immature Granulocytes: 1 %
Lymphocytes Relative: 15 %
Lymphs Abs: 1.1 10*3/uL (ref 0.7–4.0)
MCH: 30.5 pg (ref 26.0–34.0)
MCHC: 31.7 g/dL (ref 30.0–36.0)
MCV: 96.4 fL (ref 80.0–100.0)
Monocytes Absolute: 0.8 10*3/uL (ref 0.1–1.0)
Monocytes Relative: 11 %
Neutro Abs: 5.2 10*3/uL (ref 1.7–7.7)
Neutrophils Relative %: 70 %
Platelets: 243 10*3/uL (ref 150–400)
RBC: 3.34 MIL/uL — ABNORMAL LOW (ref 4.22–5.81)
RDW: 14.2 % (ref 11.5–15.5)
WBC: 7.3 10*3/uL (ref 4.0–10.5)
nRBC: 0 % (ref 0.0–0.2)

## 2019-03-26 LAB — GLUCOSE, CAPILLARY
Glucose-Capillary: 142 mg/dL — ABNORMAL HIGH (ref 70–99)
Glucose-Capillary: 208 mg/dL — ABNORMAL HIGH (ref 70–99)
Glucose-Capillary: 116 mg/dL — ABNORMAL HIGH (ref 70–99)
Glucose-Capillary: 128 mg/dL — ABNORMAL HIGH (ref 70–99)

## 2019-03-26 MED ORDER — DIPHENHYDRAMINE HCL 25 MG PO CAPS: 25.0000 mg | ORAL_CAPSULE | Freq: Three times a day (TID) | ORAL | Status: DC | PRN

## 2019-03-26 NOTE — Progress Notes (Signed)
PROGRESS NOTE  Omar Gomez [id number] DOB: 25-Feb-1925 DOA: 03/22/2019 PCP: Wenda Low, MD  Brief History    Omar Gomez is a 85 y.o. male with medical history significant of type 2 diabetes, hypertension, prostate cancer admitted with multiple falls at home.  Wife reports that he has generalized weakness which is gotten worse and he was on the floor for 4 hours and she called the police to get him out of the floor after the police left patient fell again on the floor.  And then remained on the floor for 4 hours before he called EMS.  She denied any fever chills cough shortness of breath nausea vomiting diarrhea chest pain shortness of breath or loss of consciousness or urinary complaints.  However she reports after getting treatment for prostate cancer in October 2019 he has progressive decline.  Denies any sick contacts.  Patient has very minimal p.o. intake refuses to eat or drink.  ED Course: Sodium 136 potassium 3.7 BUN 24 creatinine 1.09 CPK was 1030.  White count 10 hemoglobin 11.6 platelet count 234.  Covid pending.  UA negative for any signs of infection.  X-ray lumbar spine no evidence of acute fracture.  However he has an abdominal aortic aneurysm 6.2 cm.  X-ray of the pelvis no evidence of acute fracture.  CT head no acute hemorrhage age related atrophy and chronic microvascular changes CT of the cervical spine no acute findings  The patient was admitted to a medical bed. He was given IV hydration. His CK was followed. AAA was evaluated and was found to be 6.4. Antihypertensives were held due to low blood pressures. HCTZ was held for hypovolemia. PT/OT have evaluated the patient. They feel that the patient is appropriate for SNF placement as he is requiring assist of 2 for transfers and standing.   Consultants  . None  Procedures  . None  Antibiotics  . None  Interval History/Subjective  Pt is resting comfortably. He is confused this morning.  Objective   Vitals:   Vitals:   03/26/19 0531 03/26/19 1310  BP: 129/61 118/70  Pulse: 64 61  Resp: 14 20  Temp: 97.9 F (36.6 C) 98.5 F (36.9 C)  SpO2: 96% 94%   Exam:  Constitutional:  . The patient is awake and alert. He is very confused. Respiratory:  . No increased work of breathing. . No wheezes, rales, or rhonchi . No tactile fremitus Cardiovascular:  . Regular rate and rhythm. . No murmurs, ectopy, or gallups. . No lateral PMI. No thrills. Abdomen:  . Abdomen is soft, non-tender, non-distended. . No hernias, masses, or organomegaly. . Normoactive bowel sounds.  Musculoskeletal:  . No cyanosis, clubbing, or edema. Skin:  . No rashes, lesions, ulcers. . palpation of skin: no induration or nodules. Neurologic:  . CN 2-12 intact. . Sensation all 4 extremities intact. Psychiatric: Pt is delirious.  I have personally reviewed the following:   Today's Data  . Vitals, BMP  Imaging  . X-ray of the pelvis  Scheduled Meds: . aspirin EC  81 mg Oral QODAY  . atenolol  25 mg Oral Daily  . atorvastatin  80 mg Oral Daily  . enoxaparin (LOVENOX) injection  40 mg Subcutaneous Q24H  . ezetimibe  10 mg Oral Daily  . insulin aspart  0-15 Units Subcutaneous TID WC     Active Problems:   Fall   Rhabdomyolysis   Pressure injury of skin   LOS: 3 days    A & P  Multiple falls-patient with failure to thrive: The patient has decreased p.o. intake resulting in  Dehydration. He has had multiple falls and lives at home with his wife.  Wife does not want him to go to any rehab.  She is planning to get 24-hour help for him at home.  She has 2 daughters who lives in [place] and multiple grandchildren who can help her.  She just wants them hydrated and sending back home with therapy and with 24-hour care. However PT is stressing that the patient is requiring too much care to go home.  Rhabdomyolysis: Traumatic due to falls. CK is improved from 1030 to 880 to 321 yesterday. Will discontinue  IV fluids.  Volume depletion:  Due to poor PO intake and HCTZ for antihypertensive. Resolved. IV fluids stopped.   Hypokalemia: Supplement and monitor.  Abdominal aortic aneurysm: As of December 2019 it was 5.4 cm. As of evaluation on admission it is now up to 6.4 cm. This is followed by Dr. Oneida Alar.  Essential hypertension: The patient's blood pressures on admission were initially high, but have been normotensive since admission.  I have restarted atenolol this morning due to high BP and report of AF from telemetry. Will not restart HCTZ.  Hyperlipidemia: The patient will be restarted on Lipitor and Zetia.  DM II: The patient is on Glucophage at home. HbA1c is 7.2. His glucoses will be followed with SSI while in hospital. FSBS has been 131 - 208.  Pain in left hip. X-ray of pelvis negative. CT of the left hip is negative for fracture.  I have seen and examined this patient myself. I have spent 30 minutes in his evaluation and care.  DVT prophylaxis: Lovenox Code Status: DNR Family Communication: Discussed with wife Disposition Plan: Pending hydration and PT eval  Marielis Samara, DO [facility] Direct contact: see [email or web address]  7PM-7AM contact night coverage as above 03/25/2019, 1:14 PM  LOS: 0 days

## 2019-03-27 DIAGNOSIS — E119 Type 2 diabetes mellitus without complications: Secondary | ICD-10-CM

## 2019-03-27 DIAGNOSIS — L89312 Pressure ulcer of right buttock, stage 2: Secondary | ICD-10-CM

## 2019-03-27 LAB — BASIC METABOLIC PANEL
Anion gap: 10 (ref 5–15)
BUN: 21 mg/dL (ref 8–23)
CO2: 22 mmol/L (ref 22–32)
Calcium: 8.3 mg/dL — ABNORMAL LOW (ref 8.9–10.3)
Chloride: 105 mmol/L (ref 98–111)
Creatinine, Ser: 0.85 mg/dL (ref 0.61–1.24)
GFR calc Af Amer: >60 mL/min (ref >60)
GFR calc non Af Amer: >60 mL/min (ref >60)
Glucose, Bld: 134 mg/dL — ABNORMAL HIGH (ref 70–99)
Potassium: 3.2 mmol/L — ABNORMAL LOW (ref 3.5–5.1)
Sodium: 137 mmol/L (ref 135–145)

## 2019-03-27 LAB — GLUCOSE, CAPILLARY
Glucose-Capillary: 123 mg/dL — ABNORMAL HIGH (ref 70–99)
Glucose-Capillary: 153 mg/dL — ABNORMAL HIGH (ref 70–99)
Glucose-Capillary: 91 mg/dL (ref 70–99)
Glucose-Capillary: 134 mg/dL — ABNORMAL HIGH (ref 70–99)

## 2019-03-27 LAB — SARS CORONAVIRUS 2 (TAT 6-24 HRS): SARS Coronavirus 2: NEGATIVE

## 2019-03-27 MED ORDER — POTASSIUM CHLORIDE CRYS ER 20 MEQ PO TBCR
40.0000 meq | EXTENDED_RELEASE_TABLET | ORAL | Status: AC
  Administered 2019-03-27 (×2): 40 meq via ORAL
  Filled 2019-03-27 (×2): qty 2

## 2019-03-27 NOTE — Evaluation (Signed)
Occupational Therapy Evaluation Patient Details Name: Omar Gomez MRN: [id number] DOB: [birth year] Today's Date: 03/27/2019    History of Present Illness Omar Gomez is a 85 y.o. male with medical history significant of type 2 diabetes, hypertension, prostate cancer admitted with multiple falls at home and on floor for several hours.   Clinical Impression   Pt seen for OT evaluation this date. Prior to hospital admission, pt was able to stand with RW with MIN A from spouse and performed bathing/dressing with MOD A from wife.  Pt lives in one level home with 2 steps to enter with spouse. Pt has been steadily declining requiring more assistance including assistance from both son and neighbor to negotiate steps in/out of the home.  Currently pt demonstrates impairments in strength, general tolerance, and motor planning requiring MOD A for rolling in bed, assist of 2 persons for sup<>sit transitions, and MOD/MAX A of 2 people for most LB ADLs at this time. Pt would benefit from skilled OT to address noted impairments and functional limitations (see below for any additional details) in order to maximize safety and independence while minimizing falls risk and caregiver burden. Upon hospital discharge, recommend pt discharge to SNF.    Follow Up Recommendations  SNF    Equipment Recommendations  Other (comment)(defer to next venue of care.)    Recommendations for Other Services       Precautions / Restrictions Precautions Precautions: Fall Restrictions Weight Bearing Restrictions: No      Mobility Bed Mobility Overal bed mobility: Needs Assistance Bed Mobility: Rolling Rolling: Mod assist   Supine to sit: HOB elevated;Mod assist Sit to supine: Max assist;+2 for physical assistance;+2 for safety/equipment;HOB elevated   General bed mobility comments: mod assist for rolling towards R for bed pad appropriate placement, VC for use of hand rail to roll onto side. Mod-max assist for  supine<> sit for LE lifting and translation to/from EOB, trunk elevation and lowering, and positioning in bed upon return to supine with use of bed pads.  Transfers Overall transfer level: Needs assistance Equipment used: Rolling walker (2 wheeled) Transfers: Sit to/from Stand Sit to Stand: +2 physical assistance;Max assist;+2 safety/equipment;From elevated surface         General transfer comment: deferred at this time.    Balance  Sitting balance - Comments: deferred at this time   Standing balance comment: deferred at this time.                           ADL either performed or assessed with clinical judgement   ADL Overall ADL's : Needs assistance/impaired Eating/Feeding: Supervision/ safety;Set up;Bed level(HOB >60 degrees.)   Grooming: Wash/dry hands;Wash/dry face;Oral care;Bed level(HOB >60 degrees)   Upper Body Bathing: Minimal assistance;Moderate assistance;Bed level   Lower Body Bathing: Maximal assistance;Bed level   Upper Body Dressing : Minimal assistance;Moderate assistance;Bed level   Lower Body Dressing: Bed level;Moderate assistance;Maximal assistance;+2 for physical assistance Lower Body Dressing Details (indicate cue type and reason): MAX A x1 for donning socks, requires 2 persons for lateral rolling in bed while completing dressing tasks. Toilet Transfer: Total assistance;+2 for physical assistance Toilet Transfer Details (indicate cue type and reason): pt requiring assist of 2 persons to transition to sitting, toileting now at bed level, has urinary catheter in place. Toileting- Clothing Manipulation and Hygiene: Maximal assistance;+2 for physical assistance;Bed level     Tub/Shower Transfer Details (indicate cue type and reason): unable to assess ADL transfers at  this time. Functional mobility during ADLs: (deferred)       Vision Baseline Vision/History: Wears glasses Wears Glasses: At all times Patient Visual Report: No change from  baseline       Perception     Praxis      Pertinent Vitals/Pain Pain Assessment: Faces Faces Pain Scale: Hurts little more Pain Location: L groin pain with lateral rolling Pain Descriptors / Indicators: Moaning;Grimacing Pain Intervention(s): Limited activity within patient's tolerance;Monitored during session;Repositioned     Hand Dominance     Extremity/Trunk Assessment Upper Extremity Assessment Upper Extremity Assessment: Generalized weakness;Difficult to assess due to impaired cognition;RUE deficits/detail;LUE deficits/detail(pt requires visual model to sequence for ROM/MMT assessment.) RUE Deficits / Details: grossly >3/5 shoulder/elbow flex/ext, grip 3+/5 LUE Deficits / Details: grossly >3/5 shoulder/elbow flex/ext, grip 3+/5   Lower Extremity Assessment Lower Extremity Assessment: Defer to PT evaluation;Generalized weakness       Communication Communication Communication: HOH   Cognition Arousal/Alertness: Awake/alert Behavior During Therapy: Anxious Overall Cognitive Status: Impaired/Different from baseline(wife reports some confusion at baseline, but more noticable at this time.) Area of Impairment: Orientation;Safety/judgement;Awareness;Attention                 Orientation Level: Disoriented to;Place;Time;Situation Current Attention Level: Focused     Safety/Judgement: Decreased awareness of safety Awareness: Intellectual Problem Solving: Slow processing;Decreased initiation;Difficulty sequencing;Requires verbal cues;Requires tactile cues General Comments: HOH and confused; pt's wife at bedside stating his memory is not good presently   General Comments       Exercises Other Exercises Other Exercises: OT facilitates eduaction with pt and spouse re: role of OT in this setting. Wife verbalized understanding, pt with poor reception. Other Exercises: OT facilitates education re: importance of encouraging performance of self care as I'ly as possible,  spouse verbalized understanding, pt with poor reception.   Shoulder Instructions      Home Living Family/patient expects to be discharged to:: Private residence Living Arrangements: Spouse/significant other Available Help at Discharge: Available PRN/intermittently Type of Home: House Home Access: Stairs to enter [employer] of Steps: 2 Entrance Stairs-Rails: None Home Layout: One level     Bathroom Shower/Tub: Occupational psychologist: Standard     Home Equipment: Environmental consultant - 2 wheels;Transport chair          Prior Functioning/Environment Level of Independence: Needs assistance  Gait / Transfers Assistance Needed: Wife has been assisting with sit<>stand transfers with RW minimally at home at baseline d/t report of pt decline over recent weeks/months. Pt with 2 recent falls in past 3 months. ADL's / Homemaking Assistance Needed: Wife was moderately asssiting with bathing and dressing at baseline. Pt with difficulty threading pants, socks, underwear, shows I'ly. Could stand to pull up until ~2-3 weeks before admission.            OT Problem List: Decreased strength;Decreased range of motion;Decreased activity tolerance;Impaired balance (sitting and/or standing);Decreased cognition;Decreased safety awareness      OT Treatment/Interventions: Self-care/ADL training;Therapeutic exercise;Energy conservation;Therapeutic activities;Patient/family education    OT Goals(Current goals can be found in the care plan section) Acute Rehab OT Goals Patient Stated Goal: Pt unable, spouse wants pt to get some rehab to get stronger OT Goal Formulation: With family Time For Goal Achievement: 04/10/19 Potential to Achieve Goals: Good  OT Frequency: Min 2X/week   Barriers to D/C:            Co-evaluation              AM-PAC OT "6  Clicks" Daily Activity     Outcome Measure Help from another person eating meals?: A Little Help from another person taking care of  personal grooming?: A Little Help from another person toileting, which includes using toliet, bedpan, or urinal?: Total Help from another person bathing (including washing, rinsing, drying)?: Total Help from another person to put on and taking off regular upper body clothing?: A Little Help from another person to put on and taking off regular lower body clothing?: A Lot 6 Click Score: 13   End of Session Nurse Communication: (communication to CNA re: pt's heart monitor stating that LA lead is off.)  Activity Tolerance: Patient tolerated treatment well Patient left: in bed;with call bell/phone within reach;with bed alarm set;with family/visitor present  OT Visit Diagnosis: Muscle weakness (generalized) (M62.81);History of falling (Z91.81)                Time: [id number] OT Time Calculation (min): 30 min Charges:  OT General Charges $OT Visit: 1 Visit OT Evaluation $OT Eval Moderate Complexity: 1 Mod OT Treatments $Therapeutic Activity: 8-22 mins  Gerrianne Scale, MS, OTR/L Pager [phone] 03/27/19, 4:59 PM

## 2019-03-27 NOTE — Progress Notes (Signed)
PROGRESS NOTE    Omar Gomez  [id number] DOB: 25-Jul-1924 DOA: 03/22/2019 PCP: Wenda Low, MD    Brief Narrative:   Omar Gomez a 85 y.o.malewith medical history significant oftype 2 diabetes, hypertension, prostate cancer admitted with multiple falls at home. Wife reports that he has generalized weakness which is gotten worse and he was on the floor for 4 hours and she called the police to get him out of the floor after the police left patient fell again on the floor. And then remained on the floor for 4 hours before he called EMS. She denied any fever chills cough shortness of breath nausea vomiting diarrhea chest pain shortness of breath or loss of consciousness or urinary complaints. However she reports after getting treatment for prostate cancer in October 2019 he has progressive decline. Denies any sick contacts. Patient has very minimal p.o. intake refuses to eat or drink.  ED Course:Sodium 136 potassium 3.7 BUN 24 creatinine 1.09 CPK was 1030. White count 10 hemoglobin 11.6 platelet count 234. Covid pending. UA negative for any signs of infection. X-ray lumbar spine no evidence of acute fracture. However he has an abdominal aortic aneurysm 6.2 cm. X-ray of the pelvis no evidence of acute fracture. CT head no acute hemorrhage age related atrophy and chronic microvascular changes CT of the cervical spine no acute findings  The patient was admitted to a medical bed. He was given IV hydration. His CK was followed. AAA was evaluated and was found to be 6.4. Antihypertensives were held due to low blood pressures. HCTZ was held for hypovolemia. PT/OT have evaluated the patient. They feel that the patient is appropriate for SNF placement as he is requiring assist of 2 for transfers and standing.   Assessment & Plan:   Active Problems:   Fall   Rhabdomyolysis   Pressure injury of skin  Multiple falls Adult failure to thrive:  Patient presenting from home  with recurrent falls and decreased oral intake resulting in dehydration.  Currently lives with his spouse at home.  Seen by physical therapy with recommendations of SNF placement. --Continue PT/OT efforts while inpatient --Pending SNF placement  Rhabdomyolysis, nontraumatic  Etiology likely secondary to falls with prolonged downtime.  CK level on admission 1030 improved to 321 with IV fluid hydration. --Continue PT efforts --Fall precautions  Volume depletion:   Due to poor PO intake and HCTZ for antihypertensive. Resolved.  Initially resuscitated with IV fluids, now resolved. --Continue to encourage increased oral intake  Hypokalemia:  K 3.2 this morning, will replete. --Repeat electrolytes in a.m. to include potassium  Abdominal aortic aneurysm:  As of December 2019 it was 5.4 cm. As of evaluation on admission it is now up to 6.4 cm. This is followed by Dr. Oneida Alar.  Continue outpatient follow-up.  Essential hypertension:  The patient's blood pressures were elevated initially on presentation, but have been normotensive since admission.  --Restarted home atenolol --Discontinued HCTZ secondary to significant dehydration on presentation --Continue monitor blood pressures closely  Hyperlipidemia:  --continue home Lipitor and Zetia.  DM II:  The patient is on Glucophage at home. HbA1c is 7.2.  --Holding oral hypoglycemics while inpatient  --Continue insulin sliding scale for correction  Left hip pain:  X-ray of pelvis negative. CT of the left hip is negative for fracture. --PT/OT efforts while inpatient; pending SNF --Supportive care  Right buttock pressure injury, stage II; present on admission --Continue wound care efforts --Frequent turning  DVT prophylaxis: Lovenox Code Status: DNR Family Communication:  none present at bedside Disposition Plan: pending SNF placement; social work for coordination   Consultants:   none  Procedures:   none   Antimicrobials:  none   Subjective: Patient seen and examined at bedside, resting comfortably.  Awaiting for breakfast this morning.  Wants to know when he can discharge home.  No family present at bedside.  Denies chest pain, no shortness of breath, no abdominal pain, no headache, no fever/chills/night sweats, no cough/congestion.  No acute events overnight per nursing staff.  Objective: Vitals:   03/26/19 0531 03/26/19 1310 03/26/19 1416 03/26/19 2040  BP: 129/61 118/70  128/68  Pulse: 64 61  (!) 57  Resp: 14 20  16   Temp: 97.9 F (36.6 C) 98.5 F (36.9 C)  98.1 F (36.7 C)  TempSrc: Oral Oral  Oral  SpO2: 96% 94% 95% 96%  Weight:      Height:        Intake/Output Summary (Last 24 hours) at 03/27/2019 1255 Last data filed at 03/26/2019 1659 Gross per 24 hour  Intake 600 ml  Output 450 ml  Net 150 ml   Filed Weights   03/22/19 1008  Weight: 98 kg    Examination:  General exam: Appears calm and comfortable  Respiratory system: Clear to auscultation. Respiratory effort normal. Cardiovascular system: S1 & S2 heard, RRR. No JVD, murmurs, rubs, gallops or clicks. No pedal edema. Gastrointestinal system: Abdomen is nondistended, soft and nontender. No organomegaly or masses felt. Normal bowel sounds heard. Central nervous system: Alert, No focal neurological deficits. Extremities: Symmetric 5 x 5 power. Skin: No rashes, lesions or ulcers Psychiatry: Judgment and insight poor, Mood & affect appropriate.     Data Reviewed: I have personally reviewed following labs and imaging studies  CBC: Recent Labs  Lab 03/22/19 1107 03/23/19 0314 03/26/19 0507  WBC 10.0 11.2* 7.3  NEUTROABS 7.3  --  5.2  HGB 11.6* 12.0* 10.2*  HCT 35.9* 36.9* 32.2*  MCV 97.0 96.1 96.4  PLT 234 233 0000000   Basic Metabolic Panel: Recent Labs  Lab 03/24/19 0911 03/24/19 1815 03/25/19 0518 03/26/19 0507 03/27/19 0448  NA 136 133* 136 130* 137  K 3.1* 3.6 3.1* 3.2* 3.2*  CL 103 103 104  101 105  CO2 23 21* 21* 22 22  GLUCOSE 144* 164* 155* 143* 134*  BUN 18 20 18 17 21   CREATININE 0.85 0.87 0.81 0.76 0.85  CALCIUM 8.4* 8.2* 8.6* 8.0* 8.3*   GFR: Estimated Creatinine Clearance: 61.9 mL/min (by C-G formula based on SCr of 0.85 mg/dL). Liver Function Tests: No results for input(s): AST, ALT, ALKPHOS, BILITOT, PROT, ALBUMIN in the last 168 hours. No results for input(s): LIPASE, AMYLASE in the last 168 hours. No results for input(s): AMMONIA in the last 168 hours. Coagulation Profile: No results for input(s): INR, PROTIME in the last 168 hours. Cardiac Enzymes: Recent Labs  Lab 03/22/19 1107 03/23/19 0314 03/24/19 0911  CKTOTAL 1,030* 880* 321   BNP (last 3 results) No results for input(s): PROBNP in the last 8760 hours. HbA1C: No results for input(s): HGBA1C in the last 72 hours. CBG: Recent Labs  Lab 03/26/19 1113 03/26/19 1639 03/26/19 2042 03/27/19 0803 03/27/19 1137  GLUCAP 208* 116* 128* 123* 153*   Lipid Profile: No results for input(s): CHOL, HDL, LDLCALC, TRIG, CHOLHDL, LDLDIRECT in the last 72 hours. Thyroid Function Tests: No results for input(s): TSH, T4TOTAL, FREET4, T3FREE, THYROIDAB in the last 72 hours. Anemia Panel: No results for input(s): VITAMINB12, FOLATE,  FERRITIN, TIBC, IRON, RETICCTPCT in the last 72 hours. Sepsis Labs: No results for input(s): PROCALCITON, LATICACIDVEN in the last 168 hours.  Recent Results (from the past 240 hour(s))  SARS CORONAVIRUS 2 (TAT 6-24 HRS) Nasopharyngeal Nasopharyngeal Swab     Status: None   Collection Time: 03/22/19 12:34 PM   Specimen: Nasopharyngeal Swab  Result Value Ref Range Status   SARS Coronavirus 2 NEGATIVE NEGATIVE Final    Comment: (NOTE) SARS-CoV-2 target nucleic acids are NOT DETECTED. The SARS-CoV-2 RNA is generally detectable in upper and lower respiratory specimens during the acute phase of infection. Negative results do not preclude SARS-CoV-2 infection, do not rule out  co-infections with other pathogens, and should not be used as the sole basis for treatment or other patient management decisions. Negative results must be combined with clinical observations, patient history, and epidemiological information. The expected result is Negative. Fact Sheet for Patients: SugarRoll.be Fact Sheet for Healthcare Providers: https://www.woods-mathews.com/ This test is not yet approved or cleared by the [place] FDA and  has been authorized for detection and/or diagnosis of SARS-CoV-2 by FDA under an Emergency Use Authorization (EUA). This EUA will remain  in effect (meaning this test can be used) for the duration of the COVID-19 declaration under Section 56 4(b)(1) of the Act, 21 U.S.C. section 360bbb-3(b)(1), unless the authorization is terminated or revoked sooner. Performed at [facility], [place] [address] [place], [place] [address]   Urine culture     Status: None   Collection Time: 03/22/19  1:45 PM   Specimen: Urine, Catheterized  Result Value Ref Range Status   Specimen Description   Final    URINE, CATHETERIZED Performed at [facility] [address] [place], [place] [address]    Special Requests   Final    NONE Performed at [facility], [place] [address], [place], [place] [address]    Culture   Final    NO GROWTH Performed at [facility], [place] [address], [place], [place] [address]    Report Status 03/23/2019 FINAL  Final         Radiology Studies: No results found.      Scheduled Meds: . aspirin EC  81 mg Oral QODAY  . atenolol  25 mg Oral Daily  . atorvastatin  80 mg Oral Daily  . enoxaparin (LOVENOX) injection  40 mg Subcutaneous Q24H  . ezetimibe  10 mg Oral Daily  . insulin aspart  0-15 Units Subcutaneous TID WC   Continuous Infusions:   LOS: 4 days    Time spent: 39 minutes spent on chart review, discussion with nursing  staff, consultants, updating family and interview/physical exam; more than 50% of that time was spent in counseling and/or coordination of care.    Eric J [place] DO [facility] 03/27/2019, 12:55 PM

## 2019-03-27 NOTE — Care Management Important Message (Signed)
Important Message  Patient Details IM Letter given to Dessa Phi RN to present to the Patient Name: Omar Gomez MRN: [id number] Date of Birth: 16-Mar-1925   Medicare Important Message Given:  Yes     Kerin Salen 03/27/2019, 12:15 PM

## 2019-03-27 NOTE — TOC Progression Note (Signed)
Transition of Care [facility]) - Progression Note    Patient Details  Name: Omar Gomez MRN: [id number] Date of Birth: 04/13/1925  Transition of Care [facility]) CM/SW Contact  Sindhu Nguyen, Juliann Pulse, RN Phone Number: 03/27/2019, 11:45 AM  Clinical Narrative: Faxed to [place] for auth for SNF-[facility]-they will have a bed tomorrow. Await covid, PT notes/OT eval.      Expected Discharge Plan: Skilled Nursing Facility Barriers to Discharge: Insurance Authorization(covid)  Expected Discharge Plan and Services Expected Discharge Plan: [place]   Discharge Planning Services: CM Consult   Living arrangements for the past 2 months: Single Family Home                                       Social Determinants of Health (SDOH) Interventions    Readmission Risk Interventions No flowsheet data found.

## 2019-03-27 NOTE — Progress Notes (Signed)
Physical Therapy Treatment Patient Details Name: Omar Gomez MRN: [id number] DOB: 12-02-1924 Today's Date: 03/27/2019    History of Present Illness Omar Gomez is a 85 y.o. male with medical history significant of type 2 diabetes, hypertension, prostate cancer admitted with multiple falls at home and on floor for several hours.    PT Comments    Pt presents with continued suspected groin discomfort with mobility, especially with attempted heel slides for LE exercise. Pt unable to exactly localize pain to PT. Pt with transfer attempt x2 today, requiring max assist +2 and pt unable to come to full standing. PT continuing to recommend SNF level of care post-acutely.     Follow Up Recommendations  SNF     Equipment Recommendations  None recommended by PT    Recommendations for Other Services       Precautions / Restrictions Precautions Precautions: Fall Restrictions Weight Bearing Restrictions: No    Mobility  Bed Mobility Overal bed mobility: Needs Assistance Bed Mobility: Rolling;Supine to Sit;Sit to Supine Rolling: Mod assist   Supine to sit: HOB elevated;Mod assist Sit to supine: Max assist;+2 for physical assistance;+2 for safety/equipment;HOB elevated   General bed mobility comments: mod assist for rolling towards R for bed pad appropriate placement, VC for use of hand rail to roll onto side. Mod-max assist for supine<> sit for LE lifting and translation to/from EOB, trunk elevation and lowering, and positioning in bed upon return to supine with use of bed pads.  Transfers Overall transfer level: Needs assistance Equipment used: Rolling walker (2 wheeled) Transfers: Sit to/from Stand Sit to Stand: +2 physical assistance;Max assist;+2 safety/equipment;From elevated surface         General transfer comment: Sit to stand attempt x2. Max assist +2 for power up, steadying, max facilitation for hip extension but pt unable to come to upright standing. Pt's bed  pads soiled with urine and feces upon standing, second stand for pericare and replacing bed pads, ~20 second stand.  Ambulation/Gait Ambulation/Gait assistance: (NT)               Stairs             Wheelchair Mobility    Modified Rankin (Stroke Patients Only)       Balance Overall balance assessment: Needs assistance Sitting-balance support: Bilateral upper extremity supported Sitting balance-Leahy Scale: Fair Sitting balance - Comments: able to sit EOB unsupported by PT   Standing balance support: Bilateral upper extremity supported Standing balance-Leahy Scale: Zero                              Cognition Arousal/Alertness: Awake/alert Behavior During Therapy: Anxious Overall Cognitive Status: Impaired/Different from baseline Area of Impairment: Orientation;Safety/judgement;Awareness;Attention                 Orientation Level: Disoriented to;Place;Time;Situation Current Attention Level: Focused     Safety/Judgement: Decreased awareness of safety Awareness: Intellectual Problem Solving: Slow processing;Decreased initiation;Difficulty sequencing;Requires verbal cues;Requires tactile cues General Comments: HOH and confused; pt's wife at bedside stating his memory is not good presently      Exercises      General Comments        Pertinent Vitals/Pain Pain Assessment: Faces Faces Pain Scale: Hurts little more Pain Location: L groin, with attempted heel slides Pain Descriptors / Indicators: Moaning;Grimacing Pain Intervention(s): Limited activity within patient's tolerance;Monitored during session;Repositioned    Home Living  Prior Function            PT Goals (current goals can now be found in the care plan section) Acute Rehab PT Goals Patient Stated Goal: Pt unable; Wife agreeable to SNF after seeing pt's mobility PT Goal Formulation: With family Time For Goal Achievement: 04/06/19 Potential  to Achieve Goals: Fair Progress towards PT goals: Progressing toward goals    Frequency    Min 2X/week      PT Plan Current plan remains appropriate    Co-evaluation              AM-PAC PT "6 Clicks" Mobility   Outcome Measure  Help needed turning from your back to your side while in a flat bed without using bedrails?: A Lot Help needed moving from lying on your back to sitting on the side of a flat bed without using bedrails?: Total Help needed moving to and from a bed to a chair (including a wheelchair)?: Total Help needed standing up from a chair using your arms (e.g., wheelchair or bedside chair)?: Total Help needed to walk in hospital room?: Total Help needed climbing 3-5 steps with a railing? : Total 6 Click Score: 7    End of Session Equipment Utilized During Treatment: Gait belt Activity Tolerance: Patient limited by pain;Patient limited by fatigue Patient left: in bed;with family/visitor present;with call bell/phone within reach;with bed alarm set Nurse Communication: Mobility status PT Visit Diagnosis: Repeated falls (R29.6);Muscle weakness (generalized) (M62.81);Unsteadiness on feet (R26.81)     Time: [id number] PT Time Calculation (min) (ACUTE ONLY): 18 min  Charges:  $Therapeutic Activity: 8-22 mins                     Shadiamond Koska Conception Chancy, PT Acute Rehabilitation Services Pager [phone]  Office [phone]    Lovett Coffin D Arabelle Bollig 03/27/2019, 2:19 PM

## 2019-03-27 NOTE — TOC Progression Note (Signed)
Transition of Care [facility]) - Progression Note    Patient Details  Name: Omar Gomez MRN: [id number] Date of Birth: 10-01-1924  Transition of Care [facility]) CM/SW Contact  Azyiah Bo, Juliann Pulse, RN Phone Number: 03/27/2019, 12:48 PM  Clinical Narrative: Will fax fl2 signed,H&P,30 day to [facility] MUST for pasrr.      Expected Discharge Plan: Skilled Nursing Facility Barriers to Discharge: Insurance Authorization(covid)  Expected Discharge Plan and Services Expected Discharge Plan: [place]   Discharge Planning Services: CM Consult   Living arrangements for the past 2 months: Single Family Home                                       Social Determinants of Health (SDOH) Interventions    Readmission Risk Interventions No flowsheet data found.

## 2019-03-28 DIAGNOSIS — Y92009 Unspecified place in unspecified non-institutional (private) residence as the place of occurrence of the external cause: Secondary | ICD-10-CM | POA: Diagnosis not present

## 2019-03-28 DIAGNOSIS — W19XXXA Unspecified fall, initial encounter: Secondary | ICD-10-CM | POA: Diagnosis not present

## 2019-03-28 DIAGNOSIS — R296 Repeated falls: Secondary | ICD-10-CM | POA: Diagnosis not present

## 2019-03-28 DIAGNOSIS — R2689 Other abnormalities of gait and mobility: Secondary | ICD-10-CM | POA: Diagnosis not present

## 2019-03-28 DIAGNOSIS — E785 Hyperlipidemia, unspecified: Secondary | ICD-10-CM | POA: Diagnosis not present

## 2019-03-28 DIAGNOSIS — E1169 Type 2 diabetes mellitus with other specified complication: Secondary | ICD-10-CM | POA: Diagnosis not present

## 2019-03-28 DIAGNOSIS — I959 Hypotension, unspecified: Secondary | ICD-10-CM | POA: Diagnosis not present

## 2019-03-28 DIAGNOSIS — I714 Abdominal aortic aneurysm, without rupture: Secondary | ICD-10-CM | POA: Diagnosis not present

## 2019-03-28 DIAGNOSIS — G309 Alzheimer's disease, unspecified: Secondary | ICD-10-CM | POA: Diagnosis not present

## 2019-03-28 DIAGNOSIS — Z794 Long term (current) use of insulin: Secondary | ICD-10-CM

## 2019-03-28 DIAGNOSIS — E039 Hypothyroidism, unspecified: Secondary | ICD-10-CM | POA: Diagnosis not present

## 2019-03-28 DIAGNOSIS — Z23 Encounter for immunization: Secondary | ICD-10-CM | POA: Diagnosis not present

## 2019-03-28 DIAGNOSIS — I10 Essential (primary) hypertension: Secondary | ICD-10-CM

## 2019-03-28 DIAGNOSIS — L89319 Pressure ulcer of right buttock, unspecified stage: Secondary | ICD-10-CM | POA: Diagnosis not present

## 2019-03-28 DIAGNOSIS — M6281 Muscle weakness (generalized): Secondary | ICD-10-CM | POA: Diagnosis not present

## 2019-03-28 DIAGNOSIS — Z7401 Bed confinement status: Secondary | ICD-10-CM | POA: Diagnosis not present

## 2019-03-28 DIAGNOSIS — R269 Unspecified abnormalities of gait and mobility: Secondary | ICD-10-CM | POA: Diagnosis not present

## 2019-03-28 DIAGNOSIS — C61 Malignant neoplasm of prostate: Secondary | ICD-10-CM | POA: Diagnosis not present

## 2019-03-28 DIAGNOSIS — R4182 Altered mental status, unspecified: Secondary | ICD-10-CM | POA: Diagnosis not present

## 2019-03-28 DIAGNOSIS — R41841 Cognitive communication deficit: Secondary | ICD-10-CM | POA: Diagnosis not present

## 2019-03-28 DIAGNOSIS — E119 Type 2 diabetes mellitus without complications: Secondary | ICD-10-CM | POA: Diagnosis not present

## 2019-03-28 DIAGNOSIS — M6282 Rhabdomyolysis: Secondary | ICD-10-CM | POA: Diagnosis not present

## 2019-03-28 DIAGNOSIS — W1830XA Fall on same level, unspecified, initial encounter: Secondary | ICD-10-CM | POA: Diagnosis not present

## 2019-03-28 DIAGNOSIS — T796XXD Traumatic ischemia of muscle, subsequent encounter: Secondary | ICD-10-CM | POA: Diagnosis not present

## 2019-03-28 DIAGNOSIS — R278 Other lack of coordination: Secondary | ICD-10-CM | POA: Diagnosis not present

## 2019-03-28 DIAGNOSIS — M2550 Pain in unspecified joint: Secondary | ICD-10-CM | POA: Diagnosis not present

## 2019-03-28 DIAGNOSIS — R627 Adult failure to thrive: Secondary | ICD-10-CM | POA: Diagnosis not present

## 2019-03-28 DIAGNOSIS — R2681 Unsteadiness on feet: Secondary | ICD-10-CM | POA: Diagnosis not present

## 2019-03-28 DIAGNOSIS — E559 Vitamin D deficiency, unspecified: Secondary | ICD-10-CM | POA: Diagnosis not present

## 2019-03-28 DIAGNOSIS — E46 Unspecified protein-calorie malnutrition: Secondary | ICD-10-CM | POA: Diagnosis not present

## 2019-03-28 DIAGNOSIS — E118 Type 2 diabetes mellitus with unspecified complications: Secondary | ICD-10-CM | POA: Diagnosis not present

## 2019-03-28 DIAGNOSIS — Z20828 Contact with and (suspected) exposure to other viral communicable diseases: Secondary | ICD-10-CM | POA: Diagnosis not present

## 2019-03-28 DIAGNOSIS — L89312 Pressure ulcer of right buttock, stage 2: Secondary | ICD-10-CM | POA: Diagnosis not present

## 2019-03-28 LAB — BASIC METABOLIC PANEL
Anion gap: 10 (ref 5–15)
BUN: 19 mg/dL (ref 8–23)
CO2: 23 mmol/L (ref 22–32)
Calcium: 8.7 mg/dL — ABNORMAL LOW (ref 8.9–10.3)
Chloride: 104 mmol/L (ref 98–111)
Creatinine, Ser: 0.83 mg/dL (ref 0.61–1.24)
GFR calc Af Amer: >60 mL/min (ref >60)
GFR calc non Af Amer: >60 mL/min (ref >60)
Glucose, Bld: 131 mg/dL — ABNORMAL HIGH (ref 70–99)
Potassium: 3.6 mmol/L (ref 3.5–5.1)
Sodium: 137 mmol/L (ref 135–145)

## 2019-03-28 LAB — MAGNESIUM: Magnesium: 1.9 mg/dL (ref 1.7–2.4)

## 2019-03-28 LAB — GLUCOSE, CAPILLARY
Glucose-Capillary: 128 mg/dL — ABNORMAL HIGH (ref 70–99)
Glucose-Capillary: 108 mg/dL — ABNORMAL HIGH (ref 70–99)

## 2019-03-28 MED ORDER — ATENOLOL 50 MG PO TABS: 25.0000 mg | ORAL_TABLET | Freq: Every day | ORAL | 0 refills | Status: DC

## 2019-03-28 MED ORDER — ASPIRIN 81 MG PO TBEC: 81.0000 mg | DELAYED_RELEASE_TABLET | ORAL | 0 refills | Status: AC

## 2019-03-28 MED ORDER — ATORVASTATIN CALCIUM 80 MG PO TABS: 80.0000 mg | ORAL_TABLET | Freq: Every day | ORAL | 0 refills | Status: DC

## 2019-03-28 MED ORDER — METFORMIN HCL 500 MG PO TABS: 500.0000 mg | ORAL_TABLET | Freq: Every day | ORAL | 0 refills | Status: DC

## 2019-03-28 MED ORDER — EZETIMIBE 10 MG PO TABS: 10.0000 mg | ORAL_TABLET | Freq: Every day | ORAL | 0 refills | Status: DC

## 2019-03-28 NOTE — Progress Notes (Signed)
Patient discharging to [facility].  Report given to Quillian Quince, RN  at facility.  IV removed - WNL..  AVS, scripts, and DNR form placed in packet to be taken by PTAR.  Waiting on arrival of transport at this time, patient in NAD.

## 2019-03-28 NOTE — TOC Transition Note (Signed)
Transition of Care [facility]) - CM/SW Discharge Note   Patient Details  Name: Omar Gomez MRN: [id number] Date of Birth: July 11, 1924  Transition of Care [facility]) CM/SW Contact:  Dessa Phi, RN Phone Number: 03/28/2019, 1:36 PM   Clinical Narrative:  D/c today to [facility]-auth received [id number] auth for 3 days 10/29 [place] fax#[phone]. D/c summary faxed w/confirmation.PTAR called for 2p pick up-Nsg aware. No further CM needs.     Final next level of care: Skilled Nursing Facility Barriers to Discharge: No Barriers Identified   Patient Goals and CMS Choice Patient states their goals for this hospitalization and ongoing recovery are:: go to rehab CMS Medicare.gov Compare Post Acute Care list provided to:: Patient Represenative (must comment) Choice offered to / list presented to : Spouse  Discharge Placement PASRR number recieved: 03/24/19            Patient chooses bed at: [facility] Patient to be transferred to facility by: [place] Name of family member notified: Ms. Capano spouse Patient and family notified of of transfer: 03/28/19  Discharge Plan and Services   Discharge Planning Services: CM Consult                                 Social Determinants of Health ([place]) Interventions     Readmission Risk Interventions No flowsheet data found.

## 2019-03-28 NOTE — TOC Progression Note (Signed)
Transition of Care [facility]) - Progression Note    Patient Details  Name: Omar Gomez MRN: [id number] Date of Birth: February 23, 1925  Transition of Care [facility]) CM/SW Contact  Zariel Capano, Juliann Pulse, RN Phone Number: 03/28/2019, 9:57 AM  Clinical Narrative: updated PT/OT/MD sent-await Josem Kaufmann from [facility] for SNF @ [facility]. covid neg 10/26.      Expected Discharge Plan: Skilled Nursing Facility Barriers to Discharge: Insurance Authorization  Expected Discharge Plan and Services Expected Discharge Plan: [place]   Discharge Planning Services: CM Consult   Living arrangements for the past 2 months: Single Family Home                                       Social Determinants of Health (SDOH) Interventions    Readmission Risk Interventions No flowsheet data found.

## 2019-03-28 NOTE — Discharge Summary (Signed)
Physician Discharge Summary  Omar Gomez [id number] DOB: [birth year] DOA: 03/22/2019  PCP: Wenda Low, MD  Admit date: 03/22/2019 Discharge date: 03/28/2019  Admitted From: Home Disposition:  [facility]  Recommendations for Outpatient Follow-up:  1. Follow up with physician at facility in 3-5 days following hospital discharge  Home Health: No Equipment/Devices: None  Discharge Condition: stable CODE STATUS: DNR Diet recommendation: Heart Healthy / Carb Modified  History of present illness:  Omar Gomez a 85 y.o.malewith medical history significant oftype 2 diabetes, hypertension, prostate cancer admitted with multiple falls at home. Wife reports that he has generalized weakness which is gotten worse and he was on the floor for 4 hours and she called the police to get him out of the floor after the police left patient fell again on the floor. And then remained on the floor for 4 hours before he called EMS. She denied any fever chills cough shortness of breath nausea vomiting diarrhea chest pain shortness of breath or loss of consciousness or urinary complaints. However she reports after getting treatment for prostate cancer in October 2019 he has progressive decline. Denies any sick contacts. Patient has very minimal p.o. intake refuses to eat or drink.  ED Course:Sodium 136 potassium 3.7 BUN 24 creatinine 1.09 CPK was 1030. White count 10 hemoglobin 11.6 platelet count 234. Covid pending. UA negative for any signs of infection. X-ray lumbar spine no evidence of acute fracture. However he has an abdominal aortic aneurysm 6.2 cm. X-ray of the pelvis no evidence of acute fracture. CT head no acute hemorrhage age related atrophy and chronic microvascular changes CT of the cervical spine no acute findings  The patient was admitted to a medical bed. He was given IV hydration. His CK was followed. AAA was evaluated and was found to be 6.4. Antihypertensives  were held due to low blood pressures. HCTZ was held for hypovolemia. PT/OT have evaluated the patient. They feel that the patient is appropriate for SNF placement as he is requiring assist of 2 for transfers and standing.   Hospital course:  Multiple falls Adult failure to thrive:  Patient presenting from home with recurrent falls and decreased oral intake resulting in dehydration. Currently lives with his spouse at home.  Seen by physical therapy with recommendations of SNF placement.  Discharging to [facility] today.  Rhabdomyolysis, nontraumatic  Etiology likely secondary to falls with prolonged downtime.  CK level on admission 1030 improved to 321 with IV fluid hydration.  Continue fall precautions.  Encourage increased oral intake.  Volume depletion:  Due to poor PO intake and HCTZ for antihypertensive. Resolved.  Initially resuscitated with IV fluids, now resolved. Continue to encourage increased oral intake  Hypokalemia:  Repleted during hospitalization, potassium 3.6 at time of discharge.  Recommend repeat BMP in 1 week.  Abdominal aortic aneurysm:  As of December 2019 it was 5.4 cm. As of evaluation on admission it is now up to 6.4 cm. This is followed by Dr. Oneida Alar.  Continue outpatient follow-up.  Essential hypertension:  The patient's blood pressures were elevated initially on presentation, but have been normotensive since admission.  Continue atenolol.  Discontinued home HCTZ secondary to significant dehydration on presentation. Continue monitor blood pressures closely  Hyperlipidemia:  Continue home Lipitor and Zetia.  DM II:  Continue home Glucophage. HbA1c is 7.2.   Left hip pain:  X-ray of pelvis negative. CT of the left hip is negative for fracture.  Right buttock pressure injury, stage II; present on admission  Continue wound care efforts, Frequent turning while in bed.  Discharge Diagnoses:  Active Problems:   Fall   Pressure injury of  skin    Discharge Instructions  Discharge Instructions    Diet - low sodium heart healthy   Complete by: As directed    Increase activity slowly   Complete by: As directed      Allergies as of 03/28/2019   No Known Allergies     Medication List    STOP taking these medications   amLODipine 5 MG tablet Commonly known as: NORVASC   aspirin 81 MG tablet Replaced by: aspirin 81 MG EC tablet   hydrochlorothiazide 25 MG tablet Commonly known as: HYDRODIURIL   trolamine salicylate 10 % cream Commonly known as: ASPERCREME     TAKE these medications   acetaminophen 500 MG tablet Commonly known as: TYLENOL Take 500 mg by mouth every 6 (six) hours as needed for mild pain or headache. Take 2 tablets every 6 hours.   aspirin 81 MG EC tablet Take 1 tablet (81 mg total) by mouth every other day. Start taking on: March 30, 2019 Replaces: aspirin 81 MG tablet   atenolol 50 MG tablet Commonly known as: TENORMIN Take 0.5 tablets (25 mg total) by mouth daily.   atorvastatin 80 MG tablet Commonly known as: LIPITOR Take 1 tablet (80 mg total) by mouth daily.   ezetimibe 10 MG tablet Commonly known as: ZETIA Take 1 tablet (10 mg total) by mouth daily.   metFORMIN 500 MG tablet Commonly known as: GLUCOPHAGE Take 1 tablet (500 mg total) by mouth daily with breakfast.       Contact information for follow-up providers    Wenda Low, MD. Schedule an appointment as soon as possible for a visit in 1 week(s).   Specialty: Internal Medicine Contact information: [address] [place] [address] [phone]            Contact information for after-discharge care    Destination    [facility] .   Service: Skilled Nursing Contact information: [place] [phone]                 No Known Allergies  Consultations:  none   Procedures/Studies: Dg Lumbar  Spine Complete  Result Date: 03/22/2019 CLINICAL DATA:  Fall EXAM: LUMBAR SPINE - COMPLETE 4+ VIEW COMPARISON:  04/15/2010 FINDINGS: Diffuse degenerative disc disease with disc space narrowing and spurring. Normal alignment. No fracture. Aortic atherosclerosis with aneurysmal dilatation of the abdominal aorta measuring up to 6.2 cm. There may be some magnification on these plain films. IMPRESSION: Diffuse degenerative changes throughout the lumbar spine. No acute bony abnormality. Abdominal aortic aneurysm measuring up to 6.2 cm. This could be further evaluated with ultrasound or CT. Electronically Signed   By: Rolm Baptise M.D.   On: 03/22/2019 11:55   Dg Pelvis 1-2 Views  Result Date: 03/22/2019 CLINICAL DATA:  Fall EXAM: PELVIS - 1-2 VIEW COMPARISON:  None. FINDINGS: Symmetric degenerative changes in the hips with joint space narrowing and spurring. SI joints symmetric and unremarkable. Degenerative changes in the visualized lower lumbar spine. No acute bony abnormality. Specifically, no fracture, subluxation, or dislocation. Vascular calcifications noted. IMPRESSION: Degenerative changes in the hips bilaterally. No acute bony abnormality. Electronically Signed   By: Rolm Baptise M.D.   On: 03/22/2019 11:53   Ct Head Wo Contrast  Result Date: 03/22/2019 CLINICAL DATA:  85 year old male with  fall and head trauma. EXAM: CT HEAD WITHOUT CONTRAST CT CERVICAL SPINE WITHOUT CONTRAST TECHNIQUE: Multidetector CT imaging of the head and cervical spine was performed following the standard protocol without intravenous contrast. Multiplanar CT image reconstructions of the cervical spine were also generated. COMPARISON:  None. FINDINGS: CT HEAD FINDINGS Brain: Mild age-related atrophy and chronic microvascular ischemic changes. There is no acute intracranial hemorrhage. No mass effect or midline shift. No extra-axial fluid collection. Vascular: No hyperdense vessel or unexpected calcification. Skull: Right  frontal craniotomy. No acute calvarial pathology. Sinuses/Orbits: Mild mucoperiosteal thickening of paranasal sinuses. No air-fluid level. The mastoid air cells are clear. Other: None. CT CERVICAL SPINE FINDINGS Alignment: No acute subluxation. Grade 1 C4-C5 and C7-T1 anterolisthesis. Skull base and vertebrae: No acute fracture. Soft tissues and spinal canal: No prevertebral fluid or swelling. No visible canal hematoma. Disc levels: Multilevel degenerative changes with endplate irregularity and disc space narrowing. Upper chest: Negative. Other: Bilateral carotid bulb calcified plaques. IMPRESSION: 1. No acute intracranial hemorrhage. Age-related atrophy and chronic microvascular ischemic changes. 2. No acute/traumatic cervical spine pathology. Electronically Signed   By: Anner Crete M.D.   On: 03/22/2019 13:05   Ct Cervical Spine Wo Contrast  Result Date: 03/22/2019 CLINICAL DATA:  85 year old male with fall and head trauma. EXAM: CT HEAD WITHOUT CONTRAST CT CERVICAL SPINE WITHOUT CONTRAST TECHNIQUE: Multidetector CT imaging of the head and cervical spine was performed following the standard protocol without intravenous contrast. Multiplanar CT image reconstructions of the cervical spine were also generated. COMPARISON:  None. FINDINGS: CT HEAD FINDINGS Brain: Mild age-related atrophy and chronic microvascular ischemic changes. There is no acute intracranial hemorrhage. No mass effect or midline shift. No extra-axial fluid collection. Vascular: No hyperdense vessel or unexpected calcification. Skull: Right frontal craniotomy. No acute calvarial pathology. Sinuses/Orbits: Mild mucoperiosteal thickening of paranasal sinuses. No air-fluid level. The mastoid air cells are clear. Other: None. CT CERVICAL SPINE FINDINGS Alignment: No acute subluxation. Grade 1 C4-C5 and C7-T1 anterolisthesis. Skull base and vertebrae: No acute fracture. Soft tissues and spinal canal: No prevertebral fluid or swelling. No  visible canal hematoma. Disc levels: Multilevel degenerative changes with endplate irregularity and disc space narrowing. Upper chest: Negative. Other: Bilateral carotid bulb calcified plaques. IMPRESSION: 1. No acute intracranial hemorrhage. Age-related atrophy and chronic microvascular ischemic changes. 2. No acute/traumatic cervical spine pathology. Electronically Signed   By: Anner Crete M.D.   On: 03/22/2019 13:05   Ct Hip Left Wo Contrast  Result Date: 03/23/2019 CLINICAL DATA:  Left hip pain.  Multiple falls EXAM: CT OF THE LEFT HIP WITHOUT CONTRAST TECHNIQUE: Multidetector CT imaging of the left hip was performed according to the standard protocol. Multiplanar CT image reconstructions were also generated. COMPARISON:  X-ray 03/22/2019, CT 02/15/2017 FINDINGS: Bones/Joint/Cartilage No acute fracture is identified. No dislocation. Mild left hip joint space narrowing. Prominent marginal acetabular osteophyte formation. Trochanteric enthesopathic changes. Degenerative changes at the pubic symphysis. No bone lesion identified. No visible hip joint effusion. Ligaments Suboptimally assessed by CT. Muscles and Tendons Mild diffuse muscular atrophy. No intramuscular hematoma. Tendinous structures about the left hip grossly intact within the limitations of this exam. Soft tissues No soft tissue hematoma.  Extensive vascular calcifications. IMPRESSION: No acute osseous abnormality, left hip. Electronically Signed   By: Davina Poke M.D.   On: 03/23/2019 17:25      Subjective: Patient seen and examined bedside, resting comfortably.  No complaints.  Plan to discharge to SNF today.  Updated patient's spouse via telephone, no concerns  on her part.  Patient denies chest pain, no shortness of breath, no abdominal pain.  No acute events overnight per nursing staff.   Discharge Exam: Vitals:   03/27/19 2051 03/28/19 0534  BP: 122/67 111/67  Pulse: 65 66  Resp: 18 18  Temp: 98.7 F (37.1 C) 98.6 F  (37 C)  SpO2: 97% 98%   Vitals:   03/26/19 1416 03/26/19 2040 03/27/19 2051 03/28/19 0534  BP:  128/68 122/67 111/67  Pulse:  (!) 57 65 66  Resp:  16 18 18   Temp:  98.1 F (36.7 C) 98.7 F (37.1 C) 98.6 F (37 C)  TempSrc:  Oral Oral Oral  SpO2: 95% 96% 97% 98%  Weight:      Height:        General: Pt is alert, awake, not in acute distress Cardiovascular: RRR, S1/S2 +, no rubs, no gallops Respiratory: CTA bilaterally, no wheezing, no rhonchi Abdominal: Soft, NT, ND, bowel sounds + Extremities: no edema, no cyanosis    The results of significant diagnostics from this hospitalization (including imaging, microbiology, ancillary and laboratory) are listed below for reference.     Microbiology: Recent Results (from the past 240 hour(s))  SARS CORONAVIRUS 2 (TAT 6-24 HRS) Nasopharyngeal Nasopharyngeal Swab     Status: None   Collection Time: 03/22/19 12:34 PM   Specimen: Nasopharyngeal Swab  Result Value Ref Range Status   SARS Coronavirus 2 NEGATIVE NEGATIVE Final    Comment: (NOTE) SARS-CoV-2 target nucleic acids are NOT DETECTED. The SARS-CoV-2 RNA is generally detectable in upper and lower respiratory specimens during the acute phase of infection. Negative results do not preclude SARS-CoV-2 infection, do not rule out co-infections with other pathogens, and should not be used as the sole basis for treatment or other patient management decisions. Negative results must be combined with clinical observations, patient history, and epidemiological information. The expected result is Negative. Fact Sheet for Patients: SugarRoll.be Fact Sheet for Healthcare Providers: https://www.woods-mathews.com/ This test is not yet approved or cleared by the [place] FDA and  has been authorized for detection and/or diagnosis of SARS-CoV-2 by FDA under an Emergency Use Authorization (EUA). This EUA will remain  in effect (meaning this test  can be used) for the duration of the COVID-19 declaration under Section 56 4(b)(1) of the Act, 21 U.S.C. section 360bbb-3(b)(1), unless the authorization is terminated or revoked sooner. Performed at [facility], [place] [address], [place], [place] [address]   Urine culture     Status: None   Collection Time: 03/22/19  1:45 PM   Specimen: Urine, Catheterized  Result Value Ref Range Status   Specimen Description   Final    URINE, CATHETERIZED Performed at [facility] [address], [place], [place] [address]    Special Requests   Final    NONE Performed at [facility], [place] [address], [place], [place] [address]    Culture   Final    NO GROWTH Performed at [facility], [place] [address] [place], [place] [address]    Report Status 03/23/2019 FINAL  Final  SARS CORONAVIRUS 2 (TAT 6-24 HRS) Nasopharyngeal Nasopharyngeal Swab     Status: None   Collection Time: 03/27/19 12:34 PM   Specimen: Nasopharyngeal Swab  Result Value Ref Range Status   SARS Coronavirus 2 NEGATIVE NEGATIVE Final    Comment: (NOTE) SARS-CoV-2 target nucleic acids are NOT DETECTED. The SARS-CoV-2 RNA is generally detectable in upper and lower respiratory  specimens during the acute phase of infection. Negative results do not preclude SARS-CoV-2 infection, do not rule out co-infections with other pathogens, and should not be used as the sole basis for treatment or other patient management decisions. Negative results must be combined with clinical observations, patient history, and epidemiological information. The expected result is Negative. Fact Sheet for Patients: SugarRoll.be Fact Sheet for Healthcare Providers: https://www.woods-mathews.com/ This test is not yet approved or cleared by the [place] FDA and  has been authorized for detection and/or diagnosis of SARS-CoV-2 by FDA under an Emergency Use  Authorization (EUA). This EUA will remain  in effect (meaning this test can be used) for the duration of the COVID-19 declaration under Section 56 4(b)(1) of the Act, 21 U.S.C. section 360bbb-3(b)(1), unless the authorization is terminated or revoked sooner. Performed at [facility], [place] [address] [place], [place] [address]      Labs: BNP (last 3 results) No results for input(s): BNP in the last 8760 hours. Basic Metabolic Panel: Recent Labs  Lab 03/24/19 1815 03/25/19 0518 03/26/19 0507 03/27/19 0448 03/28/19 0514  NA 133* 136 130* 137 137  K 3.6 3.1* 3.2* 3.2* 3.6  CL 103 104 101 105 104  CO2 21* 21* 22 22 23   GLUCOSE 164* 155* 143* 134* 131*  BUN 20 18 17 21 19   CREATININE 0.87 0.81 0.76 0.85 0.83  CALCIUM 8.2* 8.6* 8.0* 8.3* 8.7*  MG  --   --   --   --  1.9   Liver Function Tests: No results for input(s): AST, ALT, ALKPHOS, BILITOT, PROT, ALBUMIN in the last 168 hours. No results for input(s): LIPASE, AMYLASE in the last 168 hours. No results for input(s): AMMONIA in the last 168 hours. CBC: Recent Labs  Lab 03/22/19 1107 03/23/19 0314 03/26/19 0507  WBC 10.0 11.2* 7.3  NEUTROABS 7.3  --  5.2  HGB 11.6* 12.0* 10.2*  HCT 35.9* 36.9* 32.2*  MCV 97.0 96.1 96.4  PLT 234 233 243   Cardiac Enzymes: Recent Labs  Lab 03/22/19 1107 03/23/19 0314 03/24/19 0911  CKTOTAL 1,030* 880* 321   BNP: Invalid input(s): POCBNP CBG: Recent Labs  Lab 03/27/19 0803 03/27/19 1137 03/27/19 1620 03/27/19 2234 03/28/19 0731  GLUCAP 123* 153* 91 134* 128*   D-Dimer No results for input(s): DDIMER in the last 72 hours. Hgb A1c No results for input(s): HGBA1C in the last 72 hours. Lipid Profile No results for input(s): CHOL, HDL, LDLCALC, TRIG, CHOLHDL, LDLDIRECT in the last 72 hours. Thyroid function studies No results for input(s): TSH, T4TOTAL, T3FREE, THYROIDAB in the last 72 hours.  Invalid input(s): FREET3 Anemia work up No results for input(s):  VITAMINB12, FOLATE, FERRITIN, TIBC, IRON, RETICCTPCT in the last 72 hours. Urinalysis    Component Value Date/Time   COLORURINE YELLOW 03/22/2019 1345   APPEARANCEUR CLEAR 03/22/2019 1345   LABSPEC 1.015 03/22/2019 1345   PHURINE 7.0 03/22/2019 1345   GLUCOSEU NEGATIVE 03/22/2019 1345   HGBUR SMALL (A) 03/22/2019 1345   BILIRUBINUR NEGATIVE 03/22/2019 1345   KETONESUR NEGATIVE 03/22/2019 1345   PROTEINUR NEGATIVE 03/22/2019 1345   NITRITE NEGATIVE 03/22/2019 1345   LEUKOCYTESUR NEGATIVE 03/22/2019 1345   Sepsis Labs Invalid input(s): PROCALCITONIN,  WBC,  LACTICIDVEN Microbiology Recent Results (from the past 240 hour(s))  SARS CORONAVIRUS 2 (TAT 6-24 HRS) Nasopharyngeal Nasopharyngeal Swab     Status: None   Collection Time: 03/22/19 12:34 PM   Specimen: Nasopharyngeal Swab  Result Value Ref Range Status  SARS Coronavirus 2 NEGATIVE NEGATIVE Final    Comment: (NOTE) SARS-CoV-2 target nucleic acids are NOT DETECTED. The SARS-CoV-2 RNA is generally detectable in upper and lower respiratory specimens during the acute phase of infection. Negative results do not preclude SARS-CoV-2 infection, do not rule out co-infections with other pathogens, and should not be used as the sole basis for treatment or other patient management decisions. Negative results must be combined with clinical observations, patient history, and epidemiological information. The expected result is Negative. Fact Sheet for Patients: SugarRoll.be Fact Sheet for Healthcare Providers: https://www.woods-mathews.com/ This test is not yet approved or cleared by the [place] FDA and  has been authorized for detection and/or diagnosis of SARS-CoV-2 by FDA under an Emergency Use Authorization (EUA). This EUA will remain  in effect (meaning this test can be used) for the duration of the COVID-19 declaration under Section 56 4(b)(1) of the Act, 21 U.S.C. section  360bbb-3(b)(1), unless the authorization is terminated or revoked sooner. Performed at [facility], [place] [address] [place], [place] [address]   Urine culture     Status: None   Collection Time: 03/22/19  1:45 PM   Specimen: Urine, Catheterized  Result Value Ref Range Status   Specimen Description   Final    URINE, CATHETERIZED Performed at [facility] [address], [place], [place] [address]    Special Requests   Final    NONE Performed at [facility], [place] [address] [place], [place] [address]    Culture   Final    NO GROWTH Performed at [facility], [place] [address], [place], [place] [address]    Report Status 03/23/2019 FINAL  Final  SARS CORONAVIRUS 2 (TAT 6-24 HRS) Nasopharyngeal Nasopharyngeal Swab     Status: None   Collection Time: 03/27/19 12:34 PM   Specimen: Nasopharyngeal Swab  Result Value Ref Range Status   SARS Coronavirus 2 NEGATIVE NEGATIVE Final    Comment: (NOTE) SARS-CoV-2 target nucleic acids are NOT DETECTED. The SARS-CoV-2 RNA is generally detectable in upper and lower respiratory specimens during the acute phase of infection. Negative results do not preclude SARS-CoV-2 infection, do not rule out co-infections with other pathogens, and should not be used as the sole basis for treatment or other patient management decisions. Negative results must be combined with clinical observations, patient history, and epidemiological information. The expected result is Negative. Fact Sheet for Patients: SugarRoll.be Fact Sheet for Healthcare Providers: https://www.woods-mathews.com/ This test is not yet approved or cleared by the [place] FDA and  has been authorized for detection and/or diagnosis of SARS-CoV-2 by FDA under an Emergency Use Authorization (EUA). This EUA will remain  in effect (meaning this test can be used) for the duration of the COVID-19  declaration under Section 56 4(b)(1) of the Act, 21 U.S.C. section 360bbb-3(b)(1), unless the authorization is terminated or revoked sooner. Performed at [facility], [place] [address] [place], [place] [address]      Time coordinating discharge: Over 30 minutes  SIGNED:   Eric J [place] DO  [facility] 03/28/2019, 10:17 AM

## 2019-03-29 DIAGNOSIS — M6281 Muscle weakness (generalized): Secondary | ICD-10-CM | POA: Diagnosis not present

## 2019-03-29 DIAGNOSIS — R296 Repeated falls: Secondary | ICD-10-CM | POA: Diagnosis not present

## 2019-03-29 DIAGNOSIS — R627 Adult failure to thrive: Secondary | ICD-10-CM | POA: Diagnosis not present

## 2019-03-29 DIAGNOSIS — E118 Type 2 diabetes mellitus with unspecified complications: Secondary | ICD-10-CM | POA: Diagnosis not present

## 2019-03-30 DIAGNOSIS — I714 Abdominal aortic aneurysm, without rupture: Secondary | ICD-10-CM | POA: Diagnosis not present

## 2019-03-30 DIAGNOSIS — E46 Unspecified protein-calorie malnutrition: Secondary | ICD-10-CM | POA: Diagnosis not present

## 2019-03-30 DIAGNOSIS — R269 Unspecified abnormalities of gait and mobility: Secondary | ICD-10-CM | POA: Diagnosis not present

## 2019-03-30 DIAGNOSIS — G309 Alzheimer's disease, unspecified: Secondary | ICD-10-CM | POA: Diagnosis not present

## 2019-04-05 DIAGNOSIS — C61 Malignant neoplasm of prostate: Secondary | ICD-10-CM | POA: Diagnosis not present

## 2019-04-05 DIAGNOSIS — E118 Type 2 diabetes mellitus with unspecified complications: Secondary | ICD-10-CM | POA: Diagnosis not present

## 2019-04-05 DIAGNOSIS — R627 Adult failure to thrive: Secondary | ICD-10-CM | POA: Diagnosis not present

## 2019-04-05 DIAGNOSIS — R296 Repeated falls: Secondary | ICD-10-CM | POA: Diagnosis not present

## 2019-04-12 DIAGNOSIS — E118 Type 2 diabetes mellitus with unspecified complications: Secondary | ICD-10-CM | POA: Diagnosis not present

## 2019-04-12 DIAGNOSIS — R627 Adult failure to thrive: Secondary | ICD-10-CM | POA: Diagnosis not present

## 2019-04-12 DIAGNOSIS — C61 Malignant neoplasm of prostate: Secondary | ICD-10-CM | POA: Diagnosis not present

## 2019-04-12 DIAGNOSIS — R296 Repeated falls: Secondary | ICD-10-CM | POA: Diagnosis not present

## 2019-04-14 DIAGNOSIS — M6282 Rhabdomyolysis: Secondary | ICD-10-CM | POA: Diagnosis not present

## 2019-04-14 DIAGNOSIS — R2681 Unsteadiness on feet: Secondary | ICD-10-CM | POA: Diagnosis not present

## 2019-04-14 DIAGNOSIS — R2689 Other abnormalities of gait and mobility: Secondary | ICD-10-CM | POA: Diagnosis not present

## 2019-04-14 DIAGNOSIS — E1169 Type 2 diabetes mellitus with other specified complication: Secondary | ICD-10-CM | POA: Diagnosis not present

## 2019-04-14 DIAGNOSIS — E785 Hyperlipidemia, unspecified: Secondary | ICD-10-CM | POA: Diagnosis not present

## 2019-04-14 DIAGNOSIS — I10 Essential (primary) hypertension: Secondary | ICD-10-CM | POA: Diagnosis not present

## 2019-04-14 DIAGNOSIS — R41841 Cognitive communication deficit: Secondary | ICD-10-CM | POA: Diagnosis not present

## 2019-04-14 DIAGNOSIS — M6281 Muscle weakness (generalized): Secondary | ICD-10-CM | POA: Diagnosis not present

## 2019-04-14 DIAGNOSIS — L89319 Pressure ulcer of right buttock, unspecified stage: Secondary | ICD-10-CM | POA: Diagnosis not present

## 2019-04-14 DIAGNOSIS — R278 Other lack of coordination: Secondary | ICD-10-CM | POA: Diagnosis not present

## 2019-04-14 DIAGNOSIS — I714 Abdominal aortic aneurysm, without rupture: Secondary | ICD-10-CM | POA: Diagnosis not present

## 2019-04-14 DIAGNOSIS — C61 Malignant neoplasm of prostate: Secondary | ICD-10-CM | POA: Diagnosis not present

## 2019-04-14 DIAGNOSIS — R627 Adult failure to thrive: Secondary | ICD-10-CM | POA: Diagnosis not present

## 2019-04-17 DIAGNOSIS — L89319 Pressure ulcer of right buttock, unspecified stage: Secondary | ICD-10-CM | POA: Diagnosis not present

## 2019-04-17 DIAGNOSIS — M6282 Rhabdomyolysis: Secondary | ICD-10-CM | POA: Diagnosis not present

## 2019-04-17 DIAGNOSIS — R2681 Unsteadiness on feet: Secondary | ICD-10-CM | POA: Diagnosis not present

## 2019-04-17 DIAGNOSIS — I714 Abdominal aortic aneurysm, without rupture: Secondary | ICD-10-CM | POA: Diagnosis not present

## 2019-04-17 DIAGNOSIS — E785 Hyperlipidemia, unspecified: Secondary | ICD-10-CM | POA: Diagnosis not present

## 2019-04-17 DIAGNOSIS — M6281 Muscle weakness (generalized): Secondary | ICD-10-CM | POA: Diagnosis not present

## 2019-04-17 DIAGNOSIS — C61 Malignant neoplasm of prostate: Secondary | ICD-10-CM | POA: Diagnosis not present

## 2019-04-17 DIAGNOSIS — R278 Other lack of coordination: Secondary | ICD-10-CM | POA: Diagnosis not present

## 2019-04-17 DIAGNOSIS — R627 Adult failure to thrive: Secondary | ICD-10-CM | POA: Diagnosis not present

## 2019-04-17 DIAGNOSIS — I10 Essential (primary) hypertension: Secondary | ICD-10-CM | POA: Diagnosis not present

## 2019-04-17 DIAGNOSIS — E1169 Type 2 diabetes mellitus with other specified complication: Secondary | ICD-10-CM | POA: Diagnosis not present

## 2019-04-17 DIAGNOSIS — R41841 Cognitive communication deficit: Secondary | ICD-10-CM | POA: Diagnosis not present

## 2019-04-17 DIAGNOSIS — R2689 Other abnormalities of gait and mobility: Secondary | ICD-10-CM | POA: Diagnosis not present

## 2019-04-18 DIAGNOSIS — E785 Hyperlipidemia, unspecified: Secondary | ICD-10-CM | POA: Diagnosis not present

## 2019-04-18 DIAGNOSIS — R278 Other lack of coordination: Secondary | ICD-10-CM | POA: Diagnosis not present

## 2019-04-18 DIAGNOSIS — Z20828 Contact with and (suspected) exposure to other viral communicable diseases: Secondary | ICD-10-CM | POA: Diagnosis not present

## 2019-04-18 DIAGNOSIS — C61 Malignant neoplasm of prostate: Secondary | ICD-10-CM | POA: Diagnosis not present

## 2019-04-18 DIAGNOSIS — I714 Abdominal aortic aneurysm, without rupture: Secondary | ICD-10-CM | POA: Diagnosis not present

## 2019-04-18 DIAGNOSIS — R2681 Unsteadiness on feet: Secondary | ICD-10-CM | POA: Diagnosis not present

## 2019-04-18 DIAGNOSIS — I10 Essential (primary) hypertension: Secondary | ICD-10-CM | POA: Diagnosis not present

## 2019-04-18 DIAGNOSIS — R627 Adult failure to thrive: Secondary | ICD-10-CM | POA: Diagnosis not present

## 2019-04-18 DIAGNOSIS — R2689 Other abnormalities of gait and mobility: Secondary | ICD-10-CM | POA: Diagnosis not present

## 2019-04-18 DIAGNOSIS — M6281 Muscle weakness (generalized): Secondary | ICD-10-CM | POA: Diagnosis not present

## 2019-04-18 DIAGNOSIS — M6282 Rhabdomyolysis: Secondary | ICD-10-CM | POA: Diagnosis not present

## 2019-04-18 DIAGNOSIS — E1169 Type 2 diabetes mellitus with other specified complication: Secondary | ICD-10-CM | POA: Diagnosis not present

## 2019-04-18 DIAGNOSIS — L89319 Pressure ulcer of right buttock, unspecified stage: Secondary | ICD-10-CM | POA: Diagnosis not present

## 2019-04-18 DIAGNOSIS — R41841 Cognitive communication deficit: Secondary | ICD-10-CM | POA: Diagnosis not present

## 2019-04-19 DIAGNOSIS — C61 Malignant neoplasm of prostate: Secondary | ICD-10-CM | POA: Diagnosis not present

## 2019-04-19 DIAGNOSIS — I10 Essential (primary) hypertension: Secondary | ICD-10-CM | POA: Diagnosis not present

## 2019-04-19 DIAGNOSIS — M6282 Rhabdomyolysis: Secondary | ICD-10-CM | POA: Diagnosis not present

## 2019-04-19 DIAGNOSIS — M6281 Muscle weakness (generalized): Secondary | ICD-10-CM | POA: Diagnosis not present

## 2019-04-19 DIAGNOSIS — R2689 Other abnormalities of gait and mobility: Secondary | ICD-10-CM | POA: Diagnosis not present

## 2019-04-19 DIAGNOSIS — R41841 Cognitive communication deficit: Secondary | ICD-10-CM | POA: Diagnosis not present

## 2019-04-19 DIAGNOSIS — I714 Abdominal aortic aneurysm, without rupture: Secondary | ICD-10-CM | POA: Diagnosis not present

## 2019-04-19 DIAGNOSIS — L89319 Pressure ulcer of right buttock, unspecified stage: Secondary | ICD-10-CM | POA: Diagnosis not present

## 2019-04-19 DIAGNOSIS — E785 Hyperlipidemia, unspecified: Secondary | ICD-10-CM | POA: Diagnosis not present

## 2019-04-19 DIAGNOSIS — R2681 Unsteadiness on feet: Secondary | ICD-10-CM | POA: Diagnosis not present

## 2019-04-19 DIAGNOSIS — E1169 Type 2 diabetes mellitus with other specified complication: Secondary | ICD-10-CM | POA: Diagnosis not present

## 2019-04-19 DIAGNOSIS — R627 Adult failure to thrive: Secondary | ICD-10-CM | POA: Diagnosis not present

## 2019-04-19 DIAGNOSIS — R278 Other lack of coordination: Secondary | ICD-10-CM | POA: Diagnosis not present

## 2019-04-20 DIAGNOSIS — I714 Abdominal aortic aneurysm, without rupture: Secondary | ICD-10-CM | POA: Diagnosis not present

## 2019-04-20 DIAGNOSIS — R2689 Other abnormalities of gait and mobility: Secondary | ICD-10-CM | POA: Diagnosis not present

## 2019-04-20 DIAGNOSIS — L89319 Pressure ulcer of right buttock, unspecified stage: Secondary | ICD-10-CM | POA: Diagnosis not present

## 2019-04-20 DIAGNOSIS — R2681 Unsteadiness on feet: Secondary | ICD-10-CM | POA: Diagnosis not present

## 2019-04-20 DIAGNOSIS — R627 Adult failure to thrive: Secondary | ICD-10-CM | POA: Diagnosis not present

## 2019-04-20 DIAGNOSIS — M6282 Rhabdomyolysis: Secondary | ICD-10-CM | POA: Diagnosis not present

## 2019-04-20 DIAGNOSIS — R634 Abnormal weight loss: Secondary | ICD-10-CM | POA: Diagnosis not present

## 2019-04-20 DIAGNOSIS — I10 Essential (primary) hypertension: Secondary | ICD-10-CM | POA: Diagnosis not present

## 2019-04-20 DIAGNOSIS — E785 Hyperlipidemia, unspecified: Secondary | ICD-10-CM | POA: Diagnosis not present

## 2019-04-20 DIAGNOSIS — M6281 Muscle weakness (generalized): Secondary | ICD-10-CM | POA: Diagnosis not present

## 2019-04-20 DIAGNOSIS — C61 Malignant neoplasm of prostate: Secondary | ICD-10-CM | POA: Diagnosis not present

## 2019-04-20 DIAGNOSIS — R278 Other lack of coordination: Secondary | ICD-10-CM | POA: Diagnosis not present

## 2019-04-20 DIAGNOSIS — E118 Type 2 diabetes mellitus with unspecified complications: Secondary | ICD-10-CM | POA: Diagnosis not present

## 2019-04-20 DIAGNOSIS — R41841 Cognitive communication deficit: Secondary | ICD-10-CM | POA: Diagnosis not present

## 2019-04-20 DIAGNOSIS — E1169 Type 2 diabetes mellitus with other specified complication: Secondary | ICD-10-CM | POA: Diagnosis not present

## 2019-04-21 DIAGNOSIS — E1169 Type 2 diabetes mellitus with other specified complication: Secondary | ICD-10-CM | POA: Diagnosis not present

## 2019-04-21 DIAGNOSIS — C61 Malignant neoplasm of prostate: Secondary | ICD-10-CM | POA: Diagnosis not present

## 2019-04-21 DIAGNOSIS — R2689 Other abnormalities of gait and mobility: Secondary | ICD-10-CM | POA: Diagnosis not present

## 2019-04-21 DIAGNOSIS — M6282 Rhabdomyolysis: Secondary | ICD-10-CM | POA: Diagnosis not present

## 2019-04-21 DIAGNOSIS — E785 Hyperlipidemia, unspecified: Secondary | ICD-10-CM | POA: Diagnosis not present

## 2019-04-21 DIAGNOSIS — R2681 Unsteadiness on feet: Secondary | ICD-10-CM | POA: Diagnosis not present

## 2019-04-21 DIAGNOSIS — R278 Other lack of coordination: Secondary | ICD-10-CM | POA: Diagnosis not present

## 2019-04-21 DIAGNOSIS — R41841 Cognitive communication deficit: Secondary | ICD-10-CM | POA: Diagnosis not present

## 2019-04-21 DIAGNOSIS — M6281 Muscle weakness (generalized): Secondary | ICD-10-CM | POA: Diagnosis not present

## 2019-04-21 DIAGNOSIS — I10 Essential (primary) hypertension: Secondary | ICD-10-CM | POA: Diagnosis not present

## 2019-04-21 DIAGNOSIS — R627 Adult failure to thrive: Secondary | ICD-10-CM | POA: Diagnosis not present

## 2019-04-21 DIAGNOSIS — L89319 Pressure ulcer of right buttock, unspecified stage: Secondary | ICD-10-CM | POA: Diagnosis not present

## 2019-04-21 DIAGNOSIS — I714 Abdominal aortic aneurysm, without rupture: Secondary | ICD-10-CM | POA: Diagnosis not present

## 2019-04-23 DIAGNOSIS — R269 Unspecified abnormalities of gait and mobility: Secondary | ICD-10-CM | POA: Diagnosis not present

## 2019-04-23 DIAGNOSIS — I714 Abdominal aortic aneurysm, without rupture: Secondary | ICD-10-CM | POA: Diagnosis not present

## 2019-04-23 DIAGNOSIS — G309 Alzheimer's disease, unspecified: Secondary | ICD-10-CM | POA: Diagnosis not present

## 2019-04-23 DIAGNOSIS — E46 Unspecified protein-calorie malnutrition: Secondary | ICD-10-CM | POA: Diagnosis not present

## 2019-04-24 DIAGNOSIS — R2681 Unsteadiness on feet: Secondary | ICD-10-CM | POA: Diagnosis not present

## 2019-04-24 DIAGNOSIS — I714 Abdominal aortic aneurysm, without rupture: Secondary | ICD-10-CM | POA: Diagnosis not present

## 2019-04-24 DIAGNOSIS — C61 Malignant neoplasm of prostate: Secondary | ICD-10-CM | POA: Diagnosis not present

## 2019-04-24 DIAGNOSIS — E785 Hyperlipidemia, unspecified: Secondary | ICD-10-CM | POA: Diagnosis not present

## 2019-04-24 DIAGNOSIS — M6282 Rhabdomyolysis: Secondary | ICD-10-CM | POA: Diagnosis not present

## 2019-04-24 DIAGNOSIS — M6281 Muscle weakness (generalized): Secondary | ICD-10-CM | POA: Diagnosis not present

## 2019-04-24 DIAGNOSIS — L89319 Pressure ulcer of right buttock, unspecified stage: Secondary | ICD-10-CM | POA: Diagnosis not present

## 2019-04-24 DIAGNOSIS — R41841 Cognitive communication deficit: Secondary | ICD-10-CM | POA: Diagnosis not present

## 2019-04-24 DIAGNOSIS — E1169 Type 2 diabetes mellitus with other specified complication: Secondary | ICD-10-CM | POA: Diagnosis not present

## 2019-04-24 DIAGNOSIS — I10 Essential (primary) hypertension: Secondary | ICD-10-CM | POA: Diagnosis not present

## 2019-04-24 DIAGNOSIS — R278 Other lack of coordination: Secondary | ICD-10-CM | POA: Diagnosis not present

## 2019-04-24 DIAGNOSIS — R627 Adult failure to thrive: Secondary | ICD-10-CM | POA: Diagnosis not present

## 2019-04-24 DIAGNOSIS — R2689 Other abnormalities of gait and mobility: Secondary | ICD-10-CM | POA: Diagnosis not present

## 2019-04-25 DIAGNOSIS — M6281 Muscle weakness (generalized): Secondary | ICD-10-CM | POA: Diagnosis not present

## 2019-04-25 DIAGNOSIS — R2681 Unsteadiness on feet: Secondary | ICD-10-CM | POA: Diagnosis not present

## 2019-04-25 DIAGNOSIS — R2689 Other abnormalities of gait and mobility: Secondary | ICD-10-CM | POA: Diagnosis not present

## 2019-04-25 DIAGNOSIS — R627 Adult failure to thrive: Secondary | ICD-10-CM | POA: Diagnosis not present

## 2019-04-25 DIAGNOSIS — E785 Hyperlipidemia, unspecified: Secondary | ICD-10-CM | POA: Diagnosis not present

## 2019-04-25 DIAGNOSIS — M6282 Rhabdomyolysis: Secondary | ICD-10-CM | POA: Diagnosis not present

## 2019-04-25 DIAGNOSIS — C61 Malignant neoplasm of prostate: Secondary | ICD-10-CM | POA: Diagnosis not present

## 2019-04-25 DIAGNOSIS — R41841 Cognitive communication deficit: Secondary | ICD-10-CM | POA: Diagnosis not present

## 2019-04-25 DIAGNOSIS — R278 Other lack of coordination: Secondary | ICD-10-CM | POA: Diagnosis not present

## 2019-04-25 DIAGNOSIS — E1169 Type 2 diabetes mellitus with other specified complication: Secondary | ICD-10-CM | POA: Diagnosis not present

## 2019-04-25 DIAGNOSIS — I10 Essential (primary) hypertension: Secondary | ICD-10-CM | POA: Diagnosis not present

## 2019-04-25 DIAGNOSIS — L89319 Pressure ulcer of right buttock, unspecified stage: Secondary | ICD-10-CM | POA: Diagnosis not present

## 2019-04-25 DIAGNOSIS — I714 Abdominal aortic aneurysm, without rupture: Secondary | ICD-10-CM | POA: Diagnosis not present

## 2019-04-26 DIAGNOSIS — R278 Other lack of coordination: Secondary | ICD-10-CM | POA: Diagnosis not present

## 2019-04-26 DIAGNOSIS — R627 Adult failure to thrive: Secondary | ICD-10-CM | POA: Diagnosis not present

## 2019-04-26 DIAGNOSIS — I714 Abdominal aortic aneurysm, without rupture: Secondary | ICD-10-CM | POA: Diagnosis not present

## 2019-04-26 DIAGNOSIS — E785 Hyperlipidemia, unspecified: Secondary | ICD-10-CM | POA: Diagnosis not present

## 2019-04-26 DIAGNOSIS — R2681 Unsteadiness on feet: Secondary | ICD-10-CM | POA: Diagnosis not present

## 2019-04-26 DIAGNOSIS — M6281 Muscle weakness (generalized): Secondary | ICD-10-CM | POA: Diagnosis not present

## 2019-04-26 DIAGNOSIS — R41841 Cognitive communication deficit: Secondary | ICD-10-CM | POA: Diagnosis not present

## 2019-04-26 DIAGNOSIS — R2689 Other abnormalities of gait and mobility: Secondary | ICD-10-CM | POA: Diagnosis not present

## 2019-04-26 DIAGNOSIS — C61 Malignant neoplasm of prostate: Secondary | ICD-10-CM | POA: Diagnosis not present

## 2019-04-26 DIAGNOSIS — L89319 Pressure ulcer of right buttock, unspecified stage: Secondary | ICD-10-CM | POA: Diagnosis not present

## 2019-04-26 DIAGNOSIS — M6282 Rhabdomyolysis: Secondary | ICD-10-CM | POA: Diagnosis not present

## 2019-04-26 DIAGNOSIS — I10 Essential (primary) hypertension: Secondary | ICD-10-CM | POA: Diagnosis not present

## 2019-04-26 DIAGNOSIS — E1169 Type 2 diabetes mellitus with other specified complication: Secondary | ICD-10-CM | POA: Diagnosis not present

## 2019-04-28 DIAGNOSIS — R278 Other lack of coordination: Secondary | ICD-10-CM | POA: Diagnosis not present

## 2019-04-28 DIAGNOSIS — E1169 Type 2 diabetes mellitus with other specified complication: Secondary | ICD-10-CM | POA: Diagnosis not present

## 2019-04-28 DIAGNOSIS — E785 Hyperlipidemia, unspecified: Secondary | ICD-10-CM | POA: Diagnosis not present

## 2019-04-28 DIAGNOSIS — R2689 Other abnormalities of gait and mobility: Secondary | ICD-10-CM | POA: Diagnosis not present

## 2019-04-28 DIAGNOSIS — M6281 Muscle weakness (generalized): Secondary | ICD-10-CM | POA: Diagnosis not present

## 2019-04-28 DIAGNOSIS — I10 Essential (primary) hypertension: Secondary | ICD-10-CM | POA: Diagnosis not present

## 2019-04-28 DIAGNOSIS — R627 Adult failure to thrive: Secondary | ICD-10-CM | POA: Diagnosis not present

## 2019-04-28 DIAGNOSIS — I714 Abdominal aortic aneurysm, without rupture: Secondary | ICD-10-CM | POA: Diagnosis not present

## 2019-04-28 DIAGNOSIS — R2681 Unsteadiness on feet: Secondary | ICD-10-CM | POA: Diagnosis not present

## 2019-04-28 DIAGNOSIS — C61 Malignant neoplasm of prostate: Secondary | ICD-10-CM | POA: Diagnosis not present

## 2019-04-28 DIAGNOSIS — L89319 Pressure ulcer of right buttock, unspecified stage: Secondary | ICD-10-CM | POA: Diagnosis not present

## 2019-04-28 DIAGNOSIS — R41841 Cognitive communication deficit: Secondary | ICD-10-CM | POA: Diagnosis not present

## 2019-04-28 DIAGNOSIS — M6282 Rhabdomyolysis: Secondary | ICD-10-CM | POA: Diagnosis not present

## 2019-04-29 DIAGNOSIS — M6282 Rhabdomyolysis: Secondary | ICD-10-CM | POA: Diagnosis not present

## 2019-04-29 DIAGNOSIS — M6281 Muscle weakness (generalized): Secondary | ICD-10-CM | POA: Diagnosis not present

## 2019-04-29 DIAGNOSIS — E785 Hyperlipidemia, unspecified: Secondary | ICD-10-CM | POA: Diagnosis not present

## 2019-04-29 DIAGNOSIS — C61 Malignant neoplasm of prostate: Secondary | ICD-10-CM | POA: Diagnosis not present

## 2019-04-29 DIAGNOSIS — L89319 Pressure ulcer of right buttock, unspecified stage: Secondary | ICD-10-CM | POA: Diagnosis not present

## 2019-04-29 DIAGNOSIS — E1169 Type 2 diabetes mellitus with other specified complication: Secondary | ICD-10-CM | POA: Diagnosis not present

## 2019-04-29 DIAGNOSIS — R278 Other lack of coordination: Secondary | ICD-10-CM | POA: Diagnosis not present

## 2019-04-29 DIAGNOSIS — I10 Essential (primary) hypertension: Secondary | ICD-10-CM | POA: Diagnosis not present

## 2019-04-29 DIAGNOSIS — R2689 Other abnormalities of gait and mobility: Secondary | ICD-10-CM | POA: Diagnosis not present

## 2019-04-29 DIAGNOSIS — R2681 Unsteadiness on feet: Secondary | ICD-10-CM | POA: Diagnosis not present

## 2019-04-29 DIAGNOSIS — R41841 Cognitive communication deficit: Secondary | ICD-10-CM | POA: Diagnosis not present

## 2019-04-29 DIAGNOSIS — I714 Abdominal aortic aneurysm, without rupture: Secondary | ICD-10-CM | POA: Diagnosis not present

## 2019-04-29 DIAGNOSIS — R627 Adult failure to thrive: Secondary | ICD-10-CM | POA: Diagnosis not present

## 2019-04-30 DIAGNOSIS — R278 Other lack of coordination: Secondary | ICD-10-CM | POA: Diagnosis not present

## 2019-04-30 DIAGNOSIS — R2681 Unsteadiness on feet: Secondary | ICD-10-CM | POA: Diagnosis not present

## 2019-04-30 DIAGNOSIS — I714 Abdominal aortic aneurysm, without rupture: Secondary | ICD-10-CM | POA: Diagnosis not present

## 2019-04-30 DIAGNOSIS — M6281 Muscle weakness (generalized): Secondary | ICD-10-CM | POA: Diagnosis not present

## 2019-04-30 DIAGNOSIS — R2689 Other abnormalities of gait and mobility: Secondary | ICD-10-CM | POA: Diagnosis not present

## 2019-04-30 DIAGNOSIS — C61 Malignant neoplasm of prostate: Secondary | ICD-10-CM | POA: Diagnosis not present

## 2019-04-30 DIAGNOSIS — E785 Hyperlipidemia, unspecified: Secondary | ICD-10-CM | POA: Diagnosis not present

## 2019-04-30 DIAGNOSIS — E1169 Type 2 diabetes mellitus with other specified complication: Secondary | ICD-10-CM | POA: Diagnosis not present

## 2019-04-30 DIAGNOSIS — R627 Adult failure to thrive: Secondary | ICD-10-CM | POA: Diagnosis not present

## 2019-04-30 DIAGNOSIS — I10 Essential (primary) hypertension: Secondary | ICD-10-CM | POA: Diagnosis not present

## 2019-04-30 DIAGNOSIS — L89319 Pressure ulcer of right buttock, unspecified stage: Secondary | ICD-10-CM | POA: Diagnosis not present

## 2019-04-30 DIAGNOSIS — R41841 Cognitive communication deficit: Secondary | ICD-10-CM | POA: Diagnosis not present

## 2019-04-30 DIAGNOSIS — M6282 Rhabdomyolysis: Secondary | ICD-10-CM | POA: Diagnosis not present

## 2019-05-01 DIAGNOSIS — C61 Malignant neoplasm of prostate: Secondary | ICD-10-CM | POA: Diagnosis not present

## 2019-05-01 DIAGNOSIS — E1169 Type 2 diabetes mellitus with other specified complication: Secondary | ICD-10-CM | POA: Diagnosis not present

## 2019-05-01 DIAGNOSIS — M6282 Rhabdomyolysis: Secondary | ICD-10-CM | POA: Diagnosis not present

## 2019-05-01 DIAGNOSIS — E785 Hyperlipidemia, unspecified: Secondary | ICD-10-CM | POA: Diagnosis not present

## 2019-05-01 DIAGNOSIS — R41841 Cognitive communication deficit: Secondary | ICD-10-CM | POA: Diagnosis not present

## 2019-05-01 DIAGNOSIS — I714 Abdominal aortic aneurysm, without rupture: Secondary | ICD-10-CM | POA: Diagnosis not present

## 2019-05-01 DIAGNOSIS — M6281 Muscle weakness (generalized): Secondary | ICD-10-CM | POA: Diagnosis not present

## 2019-05-01 DIAGNOSIS — R278 Other lack of coordination: Secondary | ICD-10-CM | POA: Diagnosis not present

## 2019-05-01 DIAGNOSIS — I10 Essential (primary) hypertension: Secondary | ICD-10-CM | POA: Diagnosis not present

## 2019-05-01 DIAGNOSIS — R627 Adult failure to thrive: Secondary | ICD-10-CM | POA: Diagnosis not present

## 2019-05-01 DIAGNOSIS — L89319 Pressure ulcer of right buttock, unspecified stage: Secondary | ICD-10-CM | POA: Diagnosis not present

## 2019-05-01 DIAGNOSIS — R2681 Unsteadiness on feet: Secondary | ICD-10-CM | POA: Diagnosis not present

## 2019-05-01 DIAGNOSIS — R2689 Other abnormalities of gait and mobility: Secondary | ICD-10-CM | POA: Diagnosis not present

## 2019-05-02 DIAGNOSIS — E1169 Type 2 diabetes mellitus with other specified complication: Secondary | ICD-10-CM | POA: Diagnosis not present

## 2019-05-02 DIAGNOSIS — L89319 Pressure ulcer of right buttock, unspecified stage: Secondary | ICD-10-CM | POA: Diagnosis not present

## 2019-05-02 DIAGNOSIS — I10 Essential (primary) hypertension: Secondary | ICD-10-CM | POA: Diagnosis not present

## 2019-05-02 DIAGNOSIS — I714 Abdominal aortic aneurysm, without rupture: Secondary | ICD-10-CM | POA: Diagnosis not present

## 2019-05-02 DIAGNOSIS — R2681 Unsteadiness on feet: Secondary | ICD-10-CM | POA: Diagnosis not present

## 2019-05-02 DIAGNOSIS — Z20828 Contact with and (suspected) exposure to other viral communicable diseases: Secondary | ICD-10-CM | POA: Diagnosis not present

## 2019-05-02 DIAGNOSIS — R278 Other lack of coordination: Secondary | ICD-10-CM | POA: Diagnosis not present

## 2019-05-02 DIAGNOSIS — E785 Hyperlipidemia, unspecified: Secondary | ICD-10-CM | POA: Diagnosis not present

## 2019-05-02 DIAGNOSIS — R627 Adult failure to thrive: Secondary | ICD-10-CM | POA: Diagnosis not present

## 2019-05-02 DIAGNOSIS — R41841 Cognitive communication deficit: Secondary | ICD-10-CM | POA: Diagnosis not present

## 2019-05-02 DIAGNOSIS — C61 Malignant neoplasm of prostate: Secondary | ICD-10-CM | POA: Diagnosis not present

## 2019-05-02 DIAGNOSIS — M6282 Rhabdomyolysis: Secondary | ICD-10-CM | POA: Diagnosis not present

## 2019-05-02 DIAGNOSIS — R2689 Other abnormalities of gait and mobility: Secondary | ICD-10-CM | POA: Diagnosis not present

## 2019-05-02 DIAGNOSIS — M6281 Muscle weakness (generalized): Secondary | ICD-10-CM | POA: Diagnosis not present

## 2019-05-03 DIAGNOSIS — L89319 Pressure ulcer of right buttock, unspecified stage: Secondary | ICD-10-CM | POA: Diagnosis not present

## 2019-05-03 DIAGNOSIS — E1169 Type 2 diabetes mellitus with other specified complication: Secondary | ICD-10-CM | POA: Diagnosis not present

## 2019-05-03 DIAGNOSIS — R2681 Unsteadiness on feet: Secondary | ICD-10-CM | POA: Diagnosis not present

## 2019-05-03 DIAGNOSIS — M6282 Rhabdomyolysis: Secondary | ICD-10-CM | POA: Diagnosis not present

## 2019-05-03 DIAGNOSIS — M6281 Muscle weakness (generalized): Secondary | ICD-10-CM | POA: Diagnosis not present

## 2019-05-03 DIAGNOSIS — R2689 Other abnormalities of gait and mobility: Secondary | ICD-10-CM | POA: Diagnosis not present

## 2019-05-03 DIAGNOSIS — C61 Malignant neoplasm of prostate: Secondary | ICD-10-CM | POA: Diagnosis not present

## 2019-05-03 DIAGNOSIS — E785 Hyperlipidemia, unspecified: Secondary | ICD-10-CM | POA: Diagnosis not present

## 2019-05-03 DIAGNOSIS — I714 Abdominal aortic aneurysm, without rupture: Secondary | ICD-10-CM | POA: Diagnosis not present

## 2019-05-03 DIAGNOSIS — R627 Adult failure to thrive: Secondary | ICD-10-CM | POA: Diagnosis not present

## 2019-05-03 DIAGNOSIS — R278 Other lack of coordination: Secondary | ICD-10-CM | POA: Diagnosis not present

## 2019-05-03 DIAGNOSIS — R41841 Cognitive communication deficit: Secondary | ICD-10-CM | POA: Diagnosis not present

## 2019-05-03 DIAGNOSIS — I10 Essential (primary) hypertension: Secondary | ICD-10-CM | POA: Diagnosis not present

## 2019-05-04 DIAGNOSIS — I10 Essential (primary) hypertension: Secondary | ICD-10-CM | POA: Diagnosis not present

## 2019-05-04 DIAGNOSIS — L89319 Pressure ulcer of right buttock, unspecified stage: Secondary | ICD-10-CM | POA: Diagnosis not present

## 2019-05-04 DIAGNOSIS — R41841 Cognitive communication deficit: Secondary | ICD-10-CM | POA: Diagnosis not present

## 2019-05-04 DIAGNOSIS — R2689 Other abnormalities of gait and mobility: Secondary | ICD-10-CM | POA: Diagnosis not present

## 2019-05-04 DIAGNOSIS — M6281 Muscle weakness (generalized): Secondary | ICD-10-CM | POA: Diagnosis not present

## 2019-05-04 DIAGNOSIS — E785 Hyperlipidemia, unspecified: Secondary | ICD-10-CM | POA: Diagnosis not present

## 2019-05-04 DIAGNOSIS — R2681 Unsteadiness on feet: Secondary | ICD-10-CM | POA: Diagnosis not present

## 2019-05-04 DIAGNOSIS — R278 Other lack of coordination: Secondary | ICD-10-CM | POA: Diagnosis not present

## 2019-05-04 DIAGNOSIS — C61 Malignant neoplasm of prostate: Secondary | ICD-10-CM | POA: Diagnosis not present

## 2019-05-04 DIAGNOSIS — R627 Adult failure to thrive: Secondary | ICD-10-CM | POA: Diagnosis not present

## 2019-05-04 DIAGNOSIS — I714 Abdominal aortic aneurysm, without rupture: Secondary | ICD-10-CM | POA: Diagnosis not present

## 2019-05-04 DIAGNOSIS — M6282 Rhabdomyolysis: Secondary | ICD-10-CM | POA: Diagnosis not present

## 2019-05-04 DIAGNOSIS — E1169 Type 2 diabetes mellitus with other specified complication: Secondary | ICD-10-CM | POA: Diagnosis not present

## 2019-05-05 DIAGNOSIS — L89319 Pressure ulcer of right buttock, unspecified stage: Secondary | ICD-10-CM | POA: Diagnosis not present

## 2019-05-05 DIAGNOSIS — E1169 Type 2 diabetes mellitus with other specified complication: Secondary | ICD-10-CM | POA: Diagnosis not present

## 2019-05-05 DIAGNOSIS — M6282 Rhabdomyolysis: Secondary | ICD-10-CM | POA: Diagnosis not present

## 2019-05-05 DIAGNOSIS — C61 Malignant neoplasm of prostate: Secondary | ICD-10-CM | POA: Diagnosis not present

## 2019-05-05 DIAGNOSIS — R2681 Unsteadiness on feet: Secondary | ICD-10-CM | POA: Diagnosis not present

## 2019-05-05 DIAGNOSIS — R41841 Cognitive communication deficit: Secondary | ICD-10-CM | POA: Diagnosis not present

## 2019-05-05 DIAGNOSIS — I714 Abdominal aortic aneurysm, without rupture: Secondary | ICD-10-CM | POA: Diagnosis not present

## 2019-05-05 DIAGNOSIS — E785 Hyperlipidemia, unspecified: Secondary | ICD-10-CM | POA: Diagnosis not present

## 2019-05-05 DIAGNOSIS — R2689 Other abnormalities of gait and mobility: Secondary | ICD-10-CM | POA: Diagnosis not present

## 2019-05-05 DIAGNOSIS — R627 Adult failure to thrive: Secondary | ICD-10-CM | POA: Diagnosis not present

## 2019-05-05 DIAGNOSIS — R278 Other lack of coordination: Secondary | ICD-10-CM | POA: Diagnosis not present

## 2019-05-05 DIAGNOSIS — I10 Essential (primary) hypertension: Secondary | ICD-10-CM | POA: Diagnosis not present

## 2019-05-05 DIAGNOSIS — M6281 Muscle weakness (generalized): Secondary | ICD-10-CM | POA: Diagnosis not present

## 2019-05-08 DIAGNOSIS — L89319 Pressure ulcer of right buttock, unspecified stage: Secondary | ICD-10-CM | POA: Diagnosis not present

## 2019-05-08 DIAGNOSIS — M6281 Muscle weakness (generalized): Secondary | ICD-10-CM | POA: Diagnosis not present

## 2019-05-08 DIAGNOSIS — C61 Malignant neoplasm of prostate: Secondary | ICD-10-CM | POA: Diagnosis not present

## 2019-05-08 DIAGNOSIS — R278 Other lack of coordination: Secondary | ICD-10-CM | POA: Diagnosis not present

## 2019-05-08 DIAGNOSIS — R627 Adult failure to thrive: Secondary | ICD-10-CM | POA: Diagnosis not present

## 2019-05-08 DIAGNOSIS — I10 Essential (primary) hypertension: Secondary | ICD-10-CM | POA: Diagnosis not present

## 2019-05-08 DIAGNOSIS — R41841 Cognitive communication deficit: Secondary | ICD-10-CM | POA: Diagnosis not present

## 2019-05-08 DIAGNOSIS — E1169 Type 2 diabetes mellitus with other specified complication: Secondary | ICD-10-CM | POA: Diagnosis not present

## 2019-05-08 DIAGNOSIS — E785 Hyperlipidemia, unspecified: Secondary | ICD-10-CM | POA: Diagnosis not present

## 2019-05-08 DIAGNOSIS — R2689 Other abnormalities of gait and mobility: Secondary | ICD-10-CM | POA: Diagnosis not present

## 2019-05-08 DIAGNOSIS — R2681 Unsteadiness on feet: Secondary | ICD-10-CM | POA: Diagnosis not present

## 2019-05-08 DIAGNOSIS — I714 Abdominal aortic aneurysm, without rupture: Secondary | ICD-10-CM | POA: Diagnosis not present

## 2019-05-08 DIAGNOSIS — M6282 Rhabdomyolysis: Secondary | ICD-10-CM | POA: Diagnosis not present

## 2019-05-09 DIAGNOSIS — R41841 Cognitive communication deficit: Secondary | ICD-10-CM | POA: Diagnosis not present

## 2019-05-09 DIAGNOSIS — R2681 Unsteadiness on feet: Secondary | ICD-10-CM | POA: Diagnosis not present

## 2019-05-09 DIAGNOSIS — L89319 Pressure ulcer of right buttock, unspecified stage: Secondary | ICD-10-CM | POA: Diagnosis not present

## 2019-05-09 DIAGNOSIS — R296 Repeated falls: Secondary | ICD-10-CM | POA: Diagnosis not present

## 2019-05-09 DIAGNOSIS — E785 Hyperlipidemia, unspecified: Secondary | ICD-10-CM | POA: Diagnosis not present

## 2019-05-09 DIAGNOSIS — M6282 Rhabdomyolysis: Secondary | ICD-10-CM | POA: Diagnosis not present

## 2019-05-09 DIAGNOSIS — R627 Adult failure to thrive: Secondary | ICD-10-CM | POA: Diagnosis not present

## 2019-05-09 DIAGNOSIS — M6281 Muscle weakness (generalized): Secondary | ICD-10-CM | POA: Diagnosis not present

## 2019-05-09 DIAGNOSIS — I714 Abdominal aortic aneurysm, without rupture: Secondary | ICD-10-CM | POA: Diagnosis not present

## 2019-05-09 DIAGNOSIS — C61 Malignant neoplasm of prostate: Secondary | ICD-10-CM | POA: Diagnosis not present

## 2019-05-09 DIAGNOSIS — R278 Other lack of coordination: Secondary | ICD-10-CM | POA: Diagnosis not present

## 2019-05-09 DIAGNOSIS — R2689 Other abnormalities of gait and mobility: Secondary | ICD-10-CM | POA: Diagnosis not present

## 2019-05-09 DIAGNOSIS — I10 Essential (primary) hypertension: Secondary | ICD-10-CM | POA: Diagnosis not present

## 2019-05-09 DIAGNOSIS — E118 Type 2 diabetes mellitus with unspecified complications: Secondary | ICD-10-CM | POA: Diagnosis not present

## 2019-05-09 DIAGNOSIS — E1169 Type 2 diabetes mellitus with other specified complication: Secondary | ICD-10-CM | POA: Diagnosis not present

## 2019-05-10 DIAGNOSIS — R278 Other lack of coordination: Secondary | ICD-10-CM | POA: Diagnosis not present

## 2019-05-10 DIAGNOSIS — I714 Abdominal aortic aneurysm, without rupture: Secondary | ICD-10-CM | POA: Diagnosis not present

## 2019-05-10 DIAGNOSIS — M6281 Muscle weakness (generalized): Secondary | ICD-10-CM | POA: Diagnosis not present

## 2019-05-10 DIAGNOSIS — L89319 Pressure ulcer of right buttock, unspecified stage: Secondary | ICD-10-CM | POA: Diagnosis not present

## 2019-05-10 DIAGNOSIS — E1169 Type 2 diabetes mellitus with other specified complication: Secondary | ICD-10-CM | POA: Diagnosis not present

## 2019-05-10 DIAGNOSIS — R627 Adult failure to thrive: Secondary | ICD-10-CM | POA: Diagnosis not present

## 2019-05-10 DIAGNOSIS — E785 Hyperlipidemia, unspecified: Secondary | ICD-10-CM | POA: Diagnosis not present

## 2019-05-10 DIAGNOSIS — R41841 Cognitive communication deficit: Secondary | ICD-10-CM | POA: Diagnosis not present

## 2019-05-10 DIAGNOSIS — I10 Essential (primary) hypertension: Secondary | ICD-10-CM | POA: Diagnosis not present

## 2019-05-10 DIAGNOSIS — C61 Malignant neoplasm of prostate: Secondary | ICD-10-CM | POA: Diagnosis not present

## 2019-05-10 DIAGNOSIS — R2689 Other abnormalities of gait and mobility: Secondary | ICD-10-CM | POA: Diagnosis not present

## 2019-05-10 DIAGNOSIS — M6282 Rhabdomyolysis: Secondary | ICD-10-CM | POA: Diagnosis not present

## 2019-05-10 DIAGNOSIS — R2681 Unsteadiness on feet: Secondary | ICD-10-CM | POA: Diagnosis not present

## 2019-05-11 DIAGNOSIS — M6282 Rhabdomyolysis: Secondary | ICD-10-CM | POA: Diagnosis not present

## 2019-05-11 DIAGNOSIS — M6281 Muscle weakness (generalized): Secondary | ICD-10-CM | POA: Diagnosis not present

## 2019-05-11 DIAGNOSIS — C61 Malignant neoplasm of prostate: Secondary | ICD-10-CM | POA: Diagnosis not present

## 2019-05-11 DIAGNOSIS — I714 Abdominal aortic aneurysm, without rupture: Secondary | ICD-10-CM | POA: Diagnosis not present

## 2019-05-11 DIAGNOSIS — I10 Essential (primary) hypertension: Secondary | ICD-10-CM | POA: Diagnosis not present

## 2019-05-11 DIAGNOSIS — L89319 Pressure ulcer of right buttock, unspecified stage: Secondary | ICD-10-CM | POA: Diagnosis not present

## 2019-05-11 DIAGNOSIS — E1169 Type 2 diabetes mellitus with other specified complication: Secondary | ICD-10-CM | POA: Diagnosis not present

## 2019-05-11 DIAGNOSIS — R278 Other lack of coordination: Secondary | ICD-10-CM | POA: Diagnosis not present

## 2019-05-11 DIAGNOSIS — R2689 Other abnormalities of gait and mobility: Secondary | ICD-10-CM | POA: Diagnosis not present

## 2019-05-11 DIAGNOSIS — R2681 Unsteadiness on feet: Secondary | ICD-10-CM | POA: Diagnosis not present

## 2019-05-11 DIAGNOSIS — E785 Hyperlipidemia, unspecified: Secondary | ICD-10-CM | POA: Diagnosis not present

## 2019-05-11 DIAGNOSIS — R41841 Cognitive communication deficit: Secondary | ICD-10-CM | POA: Diagnosis not present

## 2019-05-11 DIAGNOSIS — R627 Adult failure to thrive: Secondary | ICD-10-CM | POA: Diagnosis not present

## 2019-05-12 DIAGNOSIS — R41841 Cognitive communication deficit: Secondary | ICD-10-CM | POA: Diagnosis not present

## 2019-05-12 DIAGNOSIS — E785 Hyperlipidemia, unspecified: Secondary | ICD-10-CM | POA: Diagnosis not present

## 2019-05-12 DIAGNOSIS — C61 Malignant neoplasm of prostate: Secondary | ICD-10-CM | POA: Diagnosis not present

## 2019-05-12 DIAGNOSIS — L89319 Pressure ulcer of right buttock, unspecified stage: Secondary | ICD-10-CM | POA: Diagnosis not present

## 2019-05-12 DIAGNOSIS — M6282 Rhabdomyolysis: Secondary | ICD-10-CM | POA: Diagnosis not present

## 2019-05-12 DIAGNOSIS — R278 Other lack of coordination: Secondary | ICD-10-CM | POA: Diagnosis not present

## 2019-05-12 DIAGNOSIS — I10 Essential (primary) hypertension: Secondary | ICD-10-CM | POA: Diagnosis not present

## 2019-05-12 DIAGNOSIS — R2681 Unsteadiness on feet: Secondary | ICD-10-CM | POA: Diagnosis not present

## 2019-05-12 DIAGNOSIS — I714 Abdominal aortic aneurysm, without rupture: Secondary | ICD-10-CM | POA: Diagnosis not present

## 2019-05-12 DIAGNOSIS — E1169 Type 2 diabetes mellitus with other specified complication: Secondary | ICD-10-CM | POA: Diagnosis not present

## 2019-05-12 DIAGNOSIS — R627 Adult failure to thrive: Secondary | ICD-10-CM | POA: Diagnosis not present

## 2019-05-12 DIAGNOSIS — M6281 Muscle weakness (generalized): Secondary | ICD-10-CM | POA: Diagnosis not present

## 2019-05-12 DIAGNOSIS — R2689 Other abnormalities of gait and mobility: Secondary | ICD-10-CM | POA: Diagnosis not present

## 2019-05-15 DIAGNOSIS — R2689 Other abnormalities of gait and mobility: Secondary | ICD-10-CM | POA: Diagnosis not present

## 2019-05-15 DIAGNOSIS — M6281 Muscle weakness (generalized): Secondary | ICD-10-CM | POA: Diagnosis not present

## 2019-05-15 DIAGNOSIS — E1169 Type 2 diabetes mellitus with other specified complication: Secondary | ICD-10-CM | POA: Diagnosis not present

## 2019-05-15 DIAGNOSIS — I10 Essential (primary) hypertension: Secondary | ICD-10-CM | POA: Diagnosis not present

## 2019-05-15 DIAGNOSIS — E785 Hyperlipidemia, unspecified: Secondary | ICD-10-CM | POA: Diagnosis not present

## 2019-05-15 DIAGNOSIS — M6282 Rhabdomyolysis: Secondary | ICD-10-CM | POA: Diagnosis not present

## 2019-05-15 DIAGNOSIS — R41841 Cognitive communication deficit: Secondary | ICD-10-CM | POA: Diagnosis not present

## 2019-05-15 DIAGNOSIS — R627 Adult failure to thrive: Secondary | ICD-10-CM | POA: Diagnosis not present

## 2019-05-15 DIAGNOSIS — R278 Other lack of coordination: Secondary | ICD-10-CM | POA: Diagnosis not present

## 2019-05-15 DIAGNOSIS — C61 Malignant neoplasm of prostate: Secondary | ICD-10-CM | POA: Diagnosis not present

## 2019-05-15 DIAGNOSIS — R2681 Unsteadiness on feet: Secondary | ICD-10-CM | POA: Diagnosis not present

## 2019-05-15 DIAGNOSIS — I714 Abdominal aortic aneurysm, without rupture: Secondary | ICD-10-CM | POA: Diagnosis not present

## 2019-05-15 DIAGNOSIS — L89319 Pressure ulcer of right buttock, unspecified stage: Secondary | ICD-10-CM | POA: Diagnosis not present

## 2019-05-16 DIAGNOSIS — R278 Other lack of coordination: Secondary | ICD-10-CM | POA: Diagnosis not present

## 2019-05-16 DIAGNOSIS — I714 Abdominal aortic aneurysm, without rupture: Secondary | ICD-10-CM | POA: Diagnosis not present

## 2019-05-16 DIAGNOSIS — L89319 Pressure ulcer of right buttock, unspecified stage: Secondary | ICD-10-CM | POA: Diagnosis not present

## 2019-05-16 DIAGNOSIS — M6282 Rhabdomyolysis: Secondary | ICD-10-CM | POA: Diagnosis not present

## 2019-05-16 DIAGNOSIS — C61 Malignant neoplasm of prostate: Secondary | ICD-10-CM | POA: Diagnosis not present

## 2019-05-16 DIAGNOSIS — E1169 Type 2 diabetes mellitus with other specified complication: Secondary | ICD-10-CM | POA: Diagnosis not present

## 2019-05-16 DIAGNOSIS — M6281 Muscle weakness (generalized): Secondary | ICD-10-CM | POA: Diagnosis not present

## 2019-05-16 DIAGNOSIS — R2681 Unsteadiness on feet: Secondary | ICD-10-CM | POA: Diagnosis not present

## 2019-05-16 DIAGNOSIS — R41841 Cognitive communication deficit: Secondary | ICD-10-CM | POA: Diagnosis not present

## 2019-05-16 DIAGNOSIS — R2689 Other abnormalities of gait and mobility: Secondary | ICD-10-CM | POA: Diagnosis not present

## 2019-05-16 DIAGNOSIS — R627 Adult failure to thrive: Secondary | ICD-10-CM | POA: Diagnosis not present

## 2019-05-16 DIAGNOSIS — E785 Hyperlipidemia, unspecified: Secondary | ICD-10-CM | POA: Diagnosis not present

## 2019-05-16 DIAGNOSIS — I10 Essential (primary) hypertension: Secondary | ICD-10-CM | POA: Diagnosis not present

## 2019-05-17 DIAGNOSIS — C61 Malignant neoplasm of prostate: Secondary | ICD-10-CM | POA: Diagnosis not present

## 2019-05-17 DIAGNOSIS — R627 Adult failure to thrive: Secondary | ICD-10-CM | POA: Diagnosis not present

## 2019-05-17 DIAGNOSIS — L89319 Pressure ulcer of right buttock, unspecified stage: Secondary | ICD-10-CM | POA: Diagnosis not present

## 2019-05-17 DIAGNOSIS — E1169 Type 2 diabetes mellitus with other specified complication: Secondary | ICD-10-CM | POA: Diagnosis not present

## 2019-05-17 DIAGNOSIS — M6282 Rhabdomyolysis: Secondary | ICD-10-CM | POA: Diagnosis not present

## 2019-05-17 DIAGNOSIS — I10 Essential (primary) hypertension: Secondary | ICD-10-CM | POA: Diagnosis not present

## 2019-05-17 DIAGNOSIS — R41841 Cognitive communication deficit: Secondary | ICD-10-CM | POA: Diagnosis not present

## 2019-05-17 DIAGNOSIS — R278 Other lack of coordination: Secondary | ICD-10-CM | POA: Diagnosis not present

## 2019-05-17 DIAGNOSIS — R2689 Other abnormalities of gait and mobility: Secondary | ICD-10-CM | POA: Diagnosis not present

## 2019-05-17 DIAGNOSIS — R2681 Unsteadiness on feet: Secondary | ICD-10-CM | POA: Diagnosis not present

## 2019-05-17 DIAGNOSIS — E785 Hyperlipidemia, unspecified: Secondary | ICD-10-CM | POA: Diagnosis not present

## 2019-05-17 DIAGNOSIS — M6281 Muscle weakness (generalized): Secondary | ICD-10-CM | POA: Diagnosis not present

## 2019-05-17 DIAGNOSIS — I714 Abdominal aortic aneurysm, without rupture: Secondary | ICD-10-CM | POA: Diagnosis not present

## 2019-05-18 DIAGNOSIS — E785 Hyperlipidemia, unspecified: Secondary | ICD-10-CM | POA: Diagnosis not present

## 2019-05-18 DIAGNOSIS — M6281 Muscle weakness (generalized): Secondary | ICD-10-CM | POA: Diagnosis not present

## 2019-05-18 DIAGNOSIS — K625 Hemorrhage of anus and rectum: Secondary | ICD-10-CM | POA: Diagnosis not present

## 2019-05-18 DIAGNOSIS — R41841 Cognitive communication deficit: Secondary | ICD-10-CM | POA: Diagnosis not present

## 2019-05-18 DIAGNOSIS — R627 Adult failure to thrive: Secondary | ICD-10-CM | POA: Diagnosis not present

## 2019-05-18 DIAGNOSIS — R2689 Other abnormalities of gait and mobility: Secondary | ICD-10-CM | POA: Diagnosis not present

## 2019-05-18 DIAGNOSIS — I714 Abdominal aortic aneurysm, without rupture: Secondary | ICD-10-CM | POA: Diagnosis not present

## 2019-05-18 DIAGNOSIS — E1169 Type 2 diabetes mellitus with other specified complication: Secondary | ICD-10-CM | POA: Diagnosis not present

## 2019-05-18 DIAGNOSIS — I10 Essential (primary) hypertension: Secondary | ICD-10-CM | POA: Diagnosis not present

## 2019-05-18 DIAGNOSIS — L89610 Pressure ulcer of right heel, unstageable: Secondary | ICD-10-CM | POA: Diagnosis not present

## 2019-05-18 DIAGNOSIS — R2681 Unsteadiness on feet: Secondary | ICD-10-CM | POA: Diagnosis not present

## 2019-05-18 DIAGNOSIS — R278 Other lack of coordination: Secondary | ICD-10-CM | POA: Diagnosis not present

## 2019-05-18 DIAGNOSIS — L89319 Pressure ulcer of right buttock, unspecified stage: Secondary | ICD-10-CM | POA: Diagnosis not present

## 2019-05-18 DIAGNOSIS — D649 Anemia, unspecified: Secondary | ICD-10-CM | POA: Diagnosis not present

## 2019-05-18 DIAGNOSIS — K648 Other hemorrhoids: Secondary | ICD-10-CM | POA: Diagnosis not present

## 2019-05-18 DIAGNOSIS — C61 Malignant neoplasm of prostate: Secondary | ICD-10-CM | POA: Diagnosis not present

## 2019-05-18 DIAGNOSIS — M6282 Rhabdomyolysis: Secondary | ICD-10-CM | POA: Diagnosis not present

## 2019-05-19 DIAGNOSIS — M6282 Rhabdomyolysis: Secondary | ICD-10-CM | POA: Diagnosis not present

## 2019-05-19 DIAGNOSIS — R2681 Unsteadiness on feet: Secondary | ICD-10-CM | POA: Diagnosis not present

## 2019-05-19 DIAGNOSIS — G309 Alzheimer's disease, unspecified: Secondary | ICD-10-CM | POA: Diagnosis not present

## 2019-05-19 DIAGNOSIS — I714 Abdominal aortic aneurysm, without rupture: Secondary | ICD-10-CM | POA: Diagnosis not present

## 2019-05-19 DIAGNOSIS — E46 Unspecified protein-calorie malnutrition: Secondary | ICD-10-CM | POA: Diagnosis not present

## 2019-05-19 DIAGNOSIS — L89319 Pressure ulcer of right buttock, unspecified stage: Secondary | ICD-10-CM | POA: Diagnosis not present

## 2019-05-19 DIAGNOSIS — C61 Malignant neoplasm of prostate: Secondary | ICD-10-CM | POA: Diagnosis not present

## 2019-05-19 DIAGNOSIS — M6281 Muscle weakness (generalized): Secondary | ICD-10-CM | POA: Diagnosis not present

## 2019-05-19 DIAGNOSIS — E1169 Type 2 diabetes mellitus with other specified complication: Secondary | ICD-10-CM | POA: Diagnosis not present

## 2019-05-19 DIAGNOSIS — I10 Essential (primary) hypertension: Secondary | ICD-10-CM | POA: Diagnosis not present

## 2019-05-19 DIAGNOSIS — R278 Other lack of coordination: Secondary | ICD-10-CM | POA: Diagnosis not present

## 2019-05-19 DIAGNOSIS — E785 Hyperlipidemia, unspecified: Secondary | ICD-10-CM | POA: Diagnosis not present

## 2019-05-19 DIAGNOSIS — R41841 Cognitive communication deficit: Secondary | ICD-10-CM | POA: Diagnosis not present

## 2019-05-19 DIAGNOSIS — R269 Unspecified abnormalities of gait and mobility: Secondary | ICD-10-CM | POA: Diagnosis not present

## 2019-05-19 DIAGNOSIS — R627 Adult failure to thrive: Secondary | ICD-10-CM | POA: Diagnosis not present

## 2019-05-19 DIAGNOSIS — R2689 Other abnormalities of gait and mobility: Secondary | ICD-10-CM | POA: Diagnosis not present

## 2019-05-22 DIAGNOSIS — I10 Essential (primary) hypertension: Secondary | ICD-10-CM | POA: Diagnosis not present

## 2019-05-22 DIAGNOSIS — M6281 Muscle weakness (generalized): Secondary | ICD-10-CM | POA: Diagnosis not present

## 2019-05-22 DIAGNOSIS — R278 Other lack of coordination: Secondary | ICD-10-CM | POA: Diagnosis not present

## 2019-05-22 DIAGNOSIS — R41841 Cognitive communication deficit: Secondary | ICD-10-CM | POA: Diagnosis not present

## 2019-05-22 DIAGNOSIS — M6282 Rhabdomyolysis: Secondary | ICD-10-CM | POA: Diagnosis not present

## 2019-05-22 DIAGNOSIS — R2681 Unsteadiness on feet: Secondary | ICD-10-CM | POA: Diagnosis not present

## 2019-05-22 DIAGNOSIS — I714 Abdominal aortic aneurysm, without rupture: Secondary | ICD-10-CM | POA: Diagnosis not present

## 2019-05-22 DIAGNOSIS — E1169 Type 2 diabetes mellitus with other specified complication: Secondary | ICD-10-CM | POA: Diagnosis not present

## 2019-05-22 DIAGNOSIS — R627 Adult failure to thrive: Secondary | ICD-10-CM | POA: Diagnosis not present

## 2019-05-22 DIAGNOSIS — L89319 Pressure ulcer of right buttock, unspecified stage: Secondary | ICD-10-CM | POA: Diagnosis not present

## 2019-05-22 DIAGNOSIS — C61 Malignant neoplasm of prostate: Secondary | ICD-10-CM | POA: Diagnosis not present

## 2019-05-22 DIAGNOSIS — E785 Hyperlipidemia, unspecified: Secondary | ICD-10-CM | POA: Diagnosis not present

## 2019-05-22 DIAGNOSIS — R2689 Other abnormalities of gait and mobility: Secondary | ICD-10-CM | POA: Diagnosis not present

## 2019-05-23 DIAGNOSIS — I714 Abdominal aortic aneurysm, without rupture: Secondary | ICD-10-CM | POA: Diagnosis not present

## 2019-05-23 DIAGNOSIS — M6282 Rhabdomyolysis: Secondary | ICD-10-CM | POA: Diagnosis not present

## 2019-05-23 DIAGNOSIS — C61 Malignant neoplasm of prostate: Secondary | ICD-10-CM | POA: Diagnosis not present

## 2019-05-23 DIAGNOSIS — R278 Other lack of coordination: Secondary | ICD-10-CM | POA: Diagnosis not present

## 2019-05-23 DIAGNOSIS — R41841 Cognitive communication deficit: Secondary | ICD-10-CM | POA: Diagnosis not present

## 2019-05-23 DIAGNOSIS — R627 Adult failure to thrive: Secondary | ICD-10-CM | POA: Diagnosis not present

## 2019-05-23 DIAGNOSIS — R2681 Unsteadiness on feet: Secondary | ICD-10-CM | POA: Diagnosis not present

## 2019-05-23 DIAGNOSIS — E785 Hyperlipidemia, unspecified: Secondary | ICD-10-CM | POA: Diagnosis not present

## 2019-05-23 DIAGNOSIS — R2689 Other abnormalities of gait and mobility: Secondary | ICD-10-CM | POA: Diagnosis not present

## 2019-05-23 DIAGNOSIS — L89319 Pressure ulcer of right buttock, unspecified stage: Secondary | ICD-10-CM | POA: Diagnosis not present

## 2019-05-23 DIAGNOSIS — I10 Essential (primary) hypertension: Secondary | ICD-10-CM | POA: Diagnosis not present

## 2019-05-23 DIAGNOSIS — M6281 Muscle weakness (generalized): Secondary | ICD-10-CM | POA: Diagnosis not present

## 2019-05-23 DIAGNOSIS — E1169 Type 2 diabetes mellitus with other specified complication: Secondary | ICD-10-CM | POA: Diagnosis not present

## 2019-05-24 DIAGNOSIS — I10 Essential (primary) hypertension: Secondary | ICD-10-CM | POA: Diagnosis not present

## 2019-05-24 DIAGNOSIS — R2689 Other abnormalities of gait and mobility: Secondary | ICD-10-CM | POA: Diagnosis not present

## 2019-05-24 DIAGNOSIS — R41841 Cognitive communication deficit: Secondary | ICD-10-CM | POA: Diagnosis not present

## 2019-05-24 DIAGNOSIS — L89319 Pressure ulcer of right buttock, unspecified stage: Secondary | ICD-10-CM | POA: Diagnosis not present

## 2019-05-24 DIAGNOSIS — E1169 Type 2 diabetes mellitus with other specified complication: Secondary | ICD-10-CM | POA: Diagnosis not present

## 2019-05-24 DIAGNOSIS — R278 Other lack of coordination: Secondary | ICD-10-CM | POA: Diagnosis not present

## 2019-05-24 DIAGNOSIS — M6282 Rhabdomyolysis: Secondary | ICD-10-CM | POA: Diagnosis not present

## 2019-05-24 DIAGNOSIS — M6281 Muscle weakness (generalized): Secondary | ICD-10-CM | POA: Diagnosis not present

## 2019-05-24 DIAGNOSIS — E785 Hyperlipidemia, unspecified: Secondary | ICD-10-CM | POA: Diagnosis not present

## 2019-05-24 DIAGNOSIS — R627 Adult failure to thrive: Secondary | ICD-10-CM | POA: Diagnosis not present

## 2019-05-24 DIAGNOSIS — Z20828 Contact with and (suspected) exposure to other viral communicable diseases: Secondary | ICD-10-CM | POA: Diagnosis not present

## 2019-05-24 DIAGNOSIS — C61 Malignant neoplasm of prostate: Secondary | ICD-10-CM | POA: Diagnosis not present

## 2019-05-24 DIAGNOSIS — R2681 Unsteadiness on feet: Secondary | ICD-10-CM | POA: Diagnosis not present

## 2019-05-24 DIAGNOSIS — I714 Abdominal aortic aneurysm, without rupture: Secondary | ICD-10-CM | POA: Diagnosis not present

## 2019-05-25 DIAGNOSIS — R2689 Other abnormalities of gait and mobility: Secondary | ICD-10-CM | POA: Diagnosis not present

## 2019-05-25 DIAGNOSIS — I10 Essential (primary) hypertension: Secondary | ICD-10-CM | POA: Diagnosis not present

## 2019-05-25 DIAGNOSIS — R2681 Unsteadiness on feet: Secondary | ICD-10-CM | POA: Diagnosis not present

## 2019-05-25 DIAGNOSIS — E1169 Type 2 diabetes mellitus with other specified complication: Secondary | ICD-10-CM | POA: Diagnosis not present

## 2019-05-25 DIAGNOSIS — C61 Malignant neoplasm of prostate: Secondary | ICD-10-CM | POA: Diagnosis not present

## 2019-05-25 DIAGNOSIS — R41841 Cognitive communication deficit: Secondary | ICD-10-CM | POA: Diagnosis not present

## 2019-05-25 DIAGNOSIS — I714 Abdominal aortic aneurysm, without rupture: Secondary | ICD-10-CM | POA: Diagnosis not present

## 2019-05-25 DIAGNOSIS — L89319 Pressure ulcer of right buttock, unspecified stage: Secondary | ICD-10-CM | POA: Diagnosis not present

## 2019-05-25 DIAGNOSIS — R278 Other lack of coordination: Secondary | ICD-10-CM | POA: Diagnosis not present

## 2019-05-25 DIAGNOSIS — M6281 Muscle weakness (generalized): Secondary | ICD-10-CM | POA: Diagnosis not present

## 2019-05-25 DIAGNOSIS — E785 Hyperlipidemia, unspecified: Secondary | ICD-10-CM | POA: Diagnosis not present

## 2019-05-25 DIAGNOSIS — R627 Adult failure to thrive: Secondary | ICD-10-CM | POA: Diagnosis not present

## 2019-05-25 DIAGNOSIS — M6282 Rhabdomyolysis: Secondary | ICD-10-CM | POA: Diagnosis not present

## 2019-05-29 DIAGNOSIS — Z20828 Contact with and (suspected) exposure to other viral communicable diseases: Secondary | ICD-10-CM | POA: Diagnosis not present

## 2019-06-05 DIAGNOSIS — Z20828 Contact with and (suspected) exposure to other viral communicable diseases: Secondary | ICD-10-CM | POA: Diagnosis not present

## 2019-06-09 DIAGNOSIS — R269 Unspecified abnormalities of gait and mobility: Secondary | ICD-10-CM | POA: Diagnosis not present

## 2019-06-09 DIAGNOSIS — G309 Alzheimer's disease, unspecified: Secondary | ICD-10-CM | POA: Diagnosis not present

## 2019-06-09 DIAGNOSIS — I714 Abdominal aortic aneurysm, without rupture: Secondary | ICD-10-CM | POA: Diagnosis not present

## 2019-06-09 DIAGNOSIS — E46 Unspecified protein-calorie malnutrition: Secondary | ICD-10-CM | POA: Diagnosis not present

## 2019-06-12 ENCOUNTER — Other Ambulatory Visit: Payer: Self-pay

## 2019-06-12 ENCOUNTER — Encounter (HOSPITAL_COMMUNITY): Payer: Self-pay | Admitting: Emergency Medicine

## 2019-06-12 ENCOUNTER — Emergency Department (HOSPITAL_COMMUNITY): Payer: Medicare Other

## 2019-06-12 ENCOUNTER — Inpatient Hospital Stay (HOSPITAL_COMMUNITY)
Admission: EM | Admit: 2019-06-12 | Discharge: 2019-06-22 | DRG: 871 | Disposition: A | Discharge status: Hospice | Payer: Medicare Other | Source: Skilled Nursing Facility | Attending: Internal Medicine | Admitting: Internal Medicine

## 2019-06-12 DIAGNOSIS — Z9189 Other specified personal risk factors, not elsewhere classified: Secondary | ICD-10-CM

## 2019-06-12 DIAGNOSIS — G9341 Metabolic encephalopathy: Secondary | ICD-10-CM | POA: Diagnosis not present

## 2019-06-12 DIAGNOSIS — I10 Essential (primary) hypertension: Secondary | ICD-10-CM | POA: Diagnosis not present

## 2019-06-12 DIAGNOSIS — E870 Hyperosmolality and hypernatremia: Secondary | ICD-10-CM | POA: Diagnosis not present

## 2019-06-12 DIAGNOSIS — B9620 Unspecified Escherichia coli [E. coli] as the cause of diseases classified elsewhere: Secondary | ICD-10-CM | POA: Diagnosis present

## 2019-06-12 DIAGNOSIS — Z87891 Personal history of nicotine dependence: Secondary | ICD-10-CM

## 2019-06-12 DIAGNOSIS — J1282 Pneumonia due to coronavirus disease 2019: Secondary | ICD-10-CM | POA: Diagnosis not present

## 2019-06-12 DIAGNOSIS — I714 Abdominal aortic aneurysm, without rupture: Secondary | ICD-10-CM

## 2019-06-12 DIAGNOSIS — E876 Hypokalemia: Secondary | ICD-10-CM | POA: Diagnosis not present

## 2019-06-12 DIAGNOSIS — Z87442 Personal history of urinary calculi: Secondary | ICD-10-CM

## 2019-06-12 DIAGNOSIS — R627 Adult failure to thrive: Secondary | ICD-10-CM | POA: Diagnosis present

## 2019-06-12 DIAGNOSIS — E118 Type 2 diabetes mellitus with unspecified complications: Secondary | ICD-10-CM | POA: Diagnosis not present

## 2019-06-12 DIAGNOSIS — Z6824 Body mass index (BMI) 24.0-24.9, adult: Secondary | ICD-10-CM

## 2019-06-12 DIAGNOSIS — I454 Nonspecific intraventricular block: Secondary | ICD-10-CM | POA: Diagnosis present

## 2019-06-12 DIAGNOSIS — E878 Other disorders of electrolyte and fluid balance, not elsewhere classified: Secondary | ICD-10-CM | POA: Diagnosis present

## 2019-06-12 DIAGNOSIS — K922 Gastrointestinal hemorrhage, unspecified: Secondary | ICD-10-CM | POA: Diagnosis not present

## 2019-06-12 DIAGNOSIS — A4189 Other specified sepsis: Principal | ICD-10-CM | POA: Diagnosis present

## 2019-06-12 DIAGNOSIS — E860 Dehydration: Secondary | ICD-10-CM | POA: Diagnosis present

## 2019-06-12 DIAGNOSIS — R410 Disorientation, unspecified: Secondary | ICD-10-CM | POA: Diagnosis not present

## 2019-06-12 DIAGNOSIS — E785 Hyperlipidemia, unspecified: Secondary | ICD-10-CM | POA: Diagnosis present

## 2019-06-12 DIAGNOSIS — J9601 Acute respiratory failure with hypoxia: Secondary | ICD-10-CM | POA: Diagnosis not present

## 2019-06-12 DIAGNOSIS — Z7189 Other specified counseling: Secondary | ICD-10-CM | POA: Diagnosis not present

## 2019-06-12 DIAGNOSIS — L89151 Pressure ulcer of sacral region, stage 1: Secondary | ICD-10-CM | POA: Diagnosis present

## 2019-06-12 DIAGNOSIS — N179 Acute kidney failure, unspecified: Secondary | ICD-10-CM

## 2019-06-12 DIAGNOSIS — N390 Urinary tract infection, site not specified: Secondary | ICD-10-CM | POA: Diagnosis present

## 2019-06-12 DIAGNOSIS — Z8546 Personal history of malignant neoplasm of prostate: Secondary | ICD-10-CM

## 2019-06-12 DIAGNOSIS — Z923 Personal history of irradiation: Secondary | ICD-10-CM

## 2019-06-12 DIAGNOSIS — U071 COVID-19: Secondary | ICD-10-CM

## 2019-06-12 DIAGNOSIS — Z7982 Long term (current) use of aspirin: Secondary | ICD-10-CM

## 2019-06-12 DIAGNOSIS — F0390 Unspecified dementia without behavioral disturbance: Secondary | ICD-10-CM | POA: Diagnosis not present

## 2019-06-12 DIAGNOSIS — Z8249 Family history of ischemic heart disease and other diseases of the circulatory system: Secondary | ICD-10-CM

## 2019-06-12 DIAGNOSIS — Z515 Encounter for palliative care: Secondary | ICD-10-CM | POA: Diagnosis not present

## 2019-06-12 DIAGNOSIS — F05 Delirium due to known physiological condition: Secondary | ICD-10-CM | POA: Diagnosis not present

## 2019-06-12 DIAGNOSIS — R0902 Hypoxemia: Secondary | ICD-10-CM | POA: Diagnosis not present

## 2019-06-12 DIAGNOSIS — E872 Acidosis: Secondary | ICD-10-CM | POA: Diagnosis not present

## 2019-06-12 DIAGNOSIS — Z8619 Personal history of other infectious and parasitic diseases: Secondary | ICD-10-CM

## 2019-06-12 DIAGNOSIS — E119 Type 2 diabetes mellitus without complications: Secondary | ICD-10-CM | POA: Diagnosis not present

## 2019-06-12 DIAGNOSIS — L8990 Pressure ulcer of unspecified site, unspecified stage: Secondary | ICD-10-CM | POA: Diagnosis present

## 2019-06-12 DIAGNOSIS — Z66 Do not resuscitate: Secondary | ICD-10-CM | POA: Diagnosis present

## 2019-06-12 DIAGNOSIS — W19XXXA Unspecified fall, initial encounter: Secondary | ICD-10-CM | POA: Diagnosis present

## 2019-06-12 DIAGNOSIS — Z9181 History of falling: Secondary | ICD-10-CM

## 2019-06-12 DIAGNOSIS — Z751 Person awaiting admission to adequate facility elsewhere: Secondary | ICD-10-CM

## 2019-06-12 DIAGNOSIS — R4589 Other symptoms and signs involving emotional state: Secondary | ICD-10-CM

## 2019-06-12 DIAGNOSIS — Z7984 Long term (current) use of oral hypoglycemic drugs: Secondary | ICD-10-CM

## 2019-06-12 DIAGNOSIS — R0602 Shortness of breath: Secondary | ICD-10-CM | POA: Diagnosis not present

## 2019-06-12 DIAGNOSIS — Z79899 Other long term (current) drug therapy: Secondary | ICD-10-CM

## 2019-06-12 DIAGNOSIS — M1710 Unilateral primary osteoarthritis, unspecified knee: Secondary | ICD-10-CM | POA: Diagnosis present

## 2019-06-12 DIAGNOSIS — E1165 Type 2 diabetes mellitus with hyperglycemia: Secondary | ICD-10-CM | POA: Diagnosis not present

## 2019-06-12 DIAGNOSIS — L89610 Pressure ulcer of right heel, unstageable: Secondary | ICD-10-CM | POA: Diagnosis not present

## 2019-06-12 LAB — URINALYSIS, ROUTINE W REFLEX MICROSCOPIC
Bilirubin Urine: NEGATIVE
Glucose, UA: NEGATIVE mg/dL
Ketones, ur: NEGATIVE mg/dL
Nitrite: NEGATIVE
Protein, ur: 100 mg/dL — AB
RBC / HPF: >50 RBC/hpf — ABNORMAL HIGH (ref 0–5)
Specific Gravity, Urine: 1.023 (ref 1.005–1.030)
WBC, UA: >50 WBC/hpf — ABNORMAL HIGH (ref 0–5)
pH: 5 (ref 5.0–8.0)

## 2019-06-12 LAB — COMPREHENSIVE METABOLIC PANEL
ALT: 35 U/L (ref 0–44)
AST: 38 U/L (ref 15–41)
Albumin: 2.4 g/dL — ABNORMAL LOW (ref 3.5–5.0)
Alkaline Phosphatase: 91 U/L (ref 38–126)
Anion gap: 14 (ref 5–15)
BUN: 66 mg/dL — ABNORMAL HIGH (ref 8–23)
CO2: 23 mmol/L (ref 22–32)
Calcium: 8.9 mg/dL (ref 8.9–10.3)
Chloride: 110 mmol/L (ref 98–111)
Creatinine, Ser: 1.49 mg/dL — ABNORMAL HIGH (ref 0.61–1.24)
GFR calc Af Amer: 47 mL/min — ABNORMAL LOW (ref >60)
GFR calc non Af Amer: 40 mL/min — ABNORMAL LOW (ref >60)
Glucose, Bld: 178 mg/dL — ABNORMAL HIGH (ref 70–99)
Potassium: 3.2 mmol/L — ABNORMAL LOW (ref 3.5–5.1)
Sodium: 147 mmol/L — ABNORMAL HIGH (ref 135–145)
Total Bilirubin: 0.9 mg/dL (ref 0.3–1.2)
Total Protein: 7.5 g/dL (ref 6.5–8.1)

## 2019-06-12 LAB — TROPONIN I (HIGH SENSITIVITY)
Troponin I (High Sensitivity): 39 ng/L — ABNORMAL HIGH (ref <18)
Troponin I (High Sensitivity): 42 ng/L — ABNORMAL HIGH (ref <18)

## 2019-06-12 LAB — LACTIC ACID, PLASMA
Lactic Acid, Venous: 2.5 mmol/L (ref 0.5–1.9)
Lactic Acid, Venous: 2.5 mmol/L (ref 0.5–1.9)

## 2019-06-12 LAB — CBC WITH DIFFERENTIAL/PLATELET
Abs Immature Granulocytes: 0.1 10*3/uL — ABNORMAL HIGH (ref 0.00–0.07)
Basophils Absolute: 0 10*3/uL (ref 0.0–0.1)
Basophils Relative: 0 %
Eosinophils Absolute: 0 10*3/uL (ref 0.0–0.5)
Eosinophils Relative: 0 %
HCT: 36.4 % — ABNORMAL LOW (ref 39.0–52.0)
Hemoglobin: 11 g/dL — ABNORMAL LOW (ref 13.0–17.0)
Immature Granulocytes: 1 %
Lymphocytes Relative: 5 %
Lymphs Abs: 0.7 10*3/uL (ref 0.7–4.0)
MCH: 28.6 pg (ref 26.0–34.0)
MCHC: 30.2 g/dL (ref 30.0–36.0)
MCV: 94.5 fL (ref 80.0–100.0)
Monocytes Absolute: 0.3 10*3/uL (ref 0.1–1.0)
Monocytes Relative: 2 %
Neutro Abs: 13.6 10*3/uL — ABNORMAL HIGH (ref 1.7–7.7)
Neutrophils Relative %: 92 %
Platelets: 337 10*3/uL (ref 150–400)
RBC: 3.85 MIL/uL — ABNORMAL LOW (ref 4.22–5.81)
RDW: 16.6 % — ABNORMAL HIGH (ref 11.5–15.5)
WBC: 14.7 10*3/uL — ABNORMAL HIGH (ref 4.0–10.5)
nRBC: 0 % (ref 0.0–0.2)

## 2019-06-12 LAB — C-REACTIVE PROTEIN: CRP: 18.2 mg/dL — ABNORMAL HIGH (ref <1.0)

## 2019-06-12 LAB — POC SARS CORONAVIRUS 2 AG -  ED: SARS Coronavirus 2 Ag: POSITIVE — AB

## 2019-06-12 LAB — HEMOGLOBIN A1C
Hgb A1c MFr Bld: 6.8 % — ABNORMAL HIGH (ref 4.8–5.6)
Mean Plasma Glucose: 148.46 mg/dL

## 2019-06-12 LAB — TRIGLYCERIDES: Triglycerides: 99 mg/dL (ref <150)

## 2019-06-12 LAB — FERRITIN: Ferritin: 3103 ng/mL — ABNORMAL HIGH (ref 24–336)

## 2019-06-12 LAB — D-DIMER, QUANTITATIVE: D-Dimer, Quant: 2.94 ug/mL-FEU — ABNORMAL HIGH (ref 0.00–0.50)

## 2019-06-12 LAB — LACTATE DEHYDROGENASE: LDH: 160 U/L (ref 98–192)

## 2019-06-12 LAB — CBG MONITORING, ED
Glucose-Capillary: 136 mg/dL — ABNORMAL HIGH (ref 70–99)
Glucose-Capillary: 169 mg/dL — ABNORMAL HIGH (ref 70–99)
Glucose-Capillary: 149 mg/dL — ABNORMAL HIGH (ref 70–99)

## 2019-06-12 LAB — FIBRINOGEN: Fibrinogen: >800 mg/dL — ABNORMAL HIGH (ref 210–475)

## 2019-06-12 LAB — PROCALCITONIN: Procalcitonin: 1.01 ng/mL

## 2019-06-12 LAB — BRAIN NATRIURETIC PEPTIDE: B Natriuretic Peptide: 147.7 pg/mL — ABNORMAL HIGH (ref 0.0–100.0)

## 2019-06-12 MED ORDER — SODIUM CHLORIDE 0.9 % IV SOLN
200.0000 mg | Freq: Once | INTRAVENOUS | Status: AC
  Administered 2019-06-12: 200 mg via INTRAVENOUS
  Filled 2019-06-12: qty 200

## 2019-06-12 MED ORDER — DEXAMETHASONE SODIUM PHOSPHATE 10 MG/ML IJ SOLN
6.0000 mg | INTRAMUSCULAR | Status: DC
  Administered 2019-06-12 – 2019-06-15 (×4): 6 mg via INTRAVENOUS
  Filled 2019-06-12 (×4): qty 1

## 2019-06-12 MED ORDER — LABETALOL HCL 5 MG/ML IV SOLN
10.0000 mg | INTRAVENOUS | Status: DC | PRN
  Administered 2019-06-12: 10 mg via INTRAVENOUS
  Filled 2019-06-12 (×2): qty 4

## 2019-06-12 MED ORDER — VANCOMYCIN HCL IN DEXTROSE 1-5 GM/200ML-% IV SOLN
1000.0000 mg | Freq: Once | INTRAVENOUS | Status: AC
  Administered 2019-06-12: 1000 mg via INTRAVENOUS
  Filled 2019-06-12: qty 200

## 2019-06-12 MED ORDER — ACETAMINOPHEN 325 MG PO TABS: 650.0000 mg | ORAL_TABLET | Freq: Four times a day (QID) | ORAL | Status: DC | PRN

## 2019-06-12 MED ORDER — HEPARIN SODIUM (PORCINE) 5000 UNIT/ML IJ SOLN
5000.0000 [IU] | Freq: Three times a day (TID) | INTRAMUSCULAR | Status: DC
  Administered 2019-06-12 – 2019-06-15 (×8): 5000 [IU] via SUBCUTANEOUS
  Filled 2019-06-12 (×8): qty 1

## 2019-06-12 MED ORDER — GUAIFENESIN-DM 100-10 MG/5ML PO SYRP: 10.0000 mL | ORAL_SOLUTION | ORAL | Status: DC | PRN

## 2019-06-12 MED ORDER — IPRATROPIUM-ALBUTEROL 20-100 MCG/ACT IN AERS
1.0000 | INHALATION_SPRAY | Freq: Four times a day (QID) | RESPIRATORY_TRACT | Status: DC
  Administered 2019-06-14 – 2019-06-16 (×9): 1 via RESPIRATORY_TRACT
  Filled 2019-06-12: qty 4

## 2019-06-12 MED ORDER — SODIUM CHLORIDE 0.9 % IV SOLN
100.0000 mg | Freq: Every day | INTRAVENOUS | Status: AC
  Administered 2019-06-13 – 2019-06-16 (×4): 100 mg via INTRAVENOUS
  Filled 2019-06-12: qty 20
  Filled 2019-06-12: qty 100
  Filled 2019-06-12 (×2): qty 20

## 2019-06-12 MED ORDER — SODIUM CHLORIDE 0.9% FLUSH
3.0000 mL | Freq: Two times a day (BID) | INTRAVENOUS | Status: DC
  Administered 2019-06-12 – 2019-06-18 (×11): 3 mL via INTRAVENOUS

## 2019-06-12 MED ORDER — SODIUM CHLORIDE 0.9 % IV SOLN: INTRAVENOUS | Status: DC

## 2019-06-12 MED ORDER — ONDANSETRON HCL 4 MG PO TABS: 4.0000 mg | ORAL_TABLET | Freq: Four times a day (QID) | ORAL | Status: DC | PRN

## 2019-06-12 MED ORDER — ZINC SULFATE 220 (50 ZN) MG PO CAPS
220.0000 mg | ORAL_CAPSULE | Freq: Every day | ORAL | Status: DC
  Administered 2019-06-15: 220 mg via ORAL
  Filled 2019-06-12 (×2): qty 1

## 2019-06-12 MED ORDER — HYDROCOD POLST-CPM POLST ER 10-8 MG/5ML PO SUER: 5.0000 mL | Freq: Two times a day (BID) | ORAL | Status: DC | PRN

## 2019-06-12 MED ORDER — VANCOMYCIN HCL 1500 MG/300ML IV SOLN
1500.0000 mg | INTRAVENOUS | Status: DC
  Administered 2019-06-13: 1500 mg via INTRAVENOUS
  Filled 2019-06-12 (×2): qty 300

## 2019-06-12 MED ORDER — POTASSIUM CHLORIDE 10 MEQ/100ML IV SOLN
10.0000 meq | INTRAVENOUS | Status: AC
  Administered 2019-06-12 (×4): 10 meq via INTRAVENOUS
  Filled 2019-06-12 (×3): qty 100

## 2019-06-12 MED ORDER — INSULIN ASPART 100 UNIT/ML ~~LOC~~ SOLN
0.0000 [IU] | SUBCUTANEOUS | Status: DC
  Administered 2019-06-12 (×2): 1 [IU] via SUBCUTANEOUS
  Administered 2019-06-13 – 2019-06-14 (×3): 2 [IU] via SUBCUTANEOUS
  Administered 2019-06-15: 1 [IU] via SUBCUTANEOUS
  Administered 2019-06-15: 2 [IU] via SUBCUTANEOUS
  Administered 2019-06-15 – 2019-06-16 (×2): 1 [IU] via SUBCUTANEOUS
  Administered 2019-06-16 – 2019-06-17 (×3): 2 [IU] via SUBCUTANEOUS
  Administered 2019-06-17: 1 [IU] via SUBCUTANEOUS
  Administered 2019-06-17 (×3): 2 [IU] via SUBCUTANEOUS
  Administered 2019-06-17: 1 [IU] via SUBCUTANEOUS
  Administered 2019-06-18 (×2): 2 [IU] via SUBCUTANEOUS
  Filled 2019-06-12: qty 0.09

## 2019-06-12 MED ORDER — SODIUM CHLORIDE 0.9 % IV SOLN: 2.0000 g | INTRAVENOUS | Status: DC

## 2019-06-12 MED ORDER — ASCORBIC ACID 500 MG PO TABS
500.0000 mg | ORAL_TABLET | Freq: Every day | ORAL | Status: DC
  Administered 2019-06-15: 500 mg via ORAL
  Filled 2019-06-12 (×2): qty 1

## 2019-06-12 MED ORDER — SENNOSIDES-DOCUSATE SODIUM 8.6-50 MG PO TABS: 1.0000 | ORAL_TABLET | Freq: Every evening | ORAL | Status: DC | PRN

## 2019-06-12 MED ORDER — ONDANSETRON HCL 4 MG/2ML IJ SOLN: 4.0000 mg | Freq: Four times a day (QID) | INTRAMUSCULAR | Status: DC | PRN

## 2019-06-12 MED ORDER — SODIUM CHLORIDE 0.9 % IV SOLN
2.0000 g | Freq: Once | INTRAVENOUS | Status: AC
  Administered 2019-06-12: 2 g via INTRAVENOUS
  Filled 2019-06-12: qty 2

## 2019-06-12 MED ORDER — DEXAMETHASONE SODIUM PHOSPHATE 10 MG/ML IJ SOLN
6.0000 mg | Freq: Once | INTRAMUSCULAR | Status: AC
  Administered 2019-06-12: 6 mg via INTRAVENOUS
  Filled 2019-06-12: qty 1

## 2019-06-12 MED ORDER — SODIUM CHLORIDE 0.9 % IV BOLUS
1000.0000 mL | Freq: Once | INTRAVENOUS | Status: AC
  Administered 2019-06-12: 1000 mL via INTRAVENOUS

## 2019-06-12 NOTE — ED Provider Notes (Addendum)
[facility] DEPT Provider Note   CSN: [place]:[id number] Arrival date & time: 06/12/19  1400     History Chief Complaint  Patient presents with  . Altered Mental Status  . Hypotension  . hypoxia    Omar Gomez is a 86 y.o. male.  Past medical history dementia, AAA, prostate cancer, diabetes.  Presents to ER with concern for shortness of breath, hypoxia.  From [facility] facility.  Reportedly on 2 L at facility, EMS reports 88% on 2, placed on 5 L.  Facility also reported episode of hypotension.  History limited due to patient's altered mental status, dementia.  Level 5 caveat.  Additional history obtained from granddaughter, wife, DNR.  Has had significant overall decline.  HPI     Past Medical History:  Diagnosis Date  . AAA (abdominal aortic aneurysm) ([place])   . Anal fissure   . Biceps tendon rupture   . Bladder stone   . Blepharitis   . Cancer ([place])    PROSTATE CANCER TREATED WITH RADIATION  . Cataract   . Dermatitis contact, eyelid   . Diabetes mellitus without complication ([place])   . Hyperlipidemia   . Hypertension   . Kidney stones   . Narcolepsy   . OA (osteoarthritis) of knee   . Olecranon bursitis   . Shingles 2011    Patient Active Problem List   Diagnosis Date Noted  . Pressure injury of skin 03/25/2019  . Fall 03/22/2019  . AAA (abdominal aortic aneurysm) without rupture ([place]) 10/12/2013    Past Surgical History:  Procedure Laterality Date  . FACIAL COSMETIC SURGERY     Lesion removed to rule out melanoma  . HERNIA REPAIR     1980       Family History  Problem Relation Age of Onset  . Alcohol abuse Brother   . Heart disease Brother     Social History   Tobacco Use  . Smoking status: Former Smoker    Quit date: 06/02/1963    Years since quitting: 56.0  . Smokeless tobacco: Never Used  Substance Use Topics  . Alcohol use: No  . Drug use: No    Home Medications Prior to Admission medications     Medication Sig Start Date End Date Taking? Authorizing Provider  acetaminophen (TYLENOL) 500 MG tablet Take 500 mg by mouth every 6 (six) hours as needed for mild pain or headache. Take 2 tablets every 6 hours.    Yes [provider]  aspirin EC 81 MG tablet Take 81 mg by mouth every other day.   Yes [provider]  atenolol (TENORMIN) 25 MG tablet Take 25 mg by mouth daily. 05/25/19  Yes [provider]  atorvastatin (LIPITOR) 80 MG tablet Take 1 tablet (80 mg total) by mouth daily. 03/28/19 06/12/19 Yes [place] Donnamarie Poag, DO  cholecalciferol (VITAMIN D) 25 MCG (1000 UNIT) tablet Take 2,000 Units by mouth daily.   Yes [provider]  dexamethasone (DECADRON) 4 MG tablet Take 4 mg by mouth daily.   Yes [provider]  ezetimibe (ZETIA) 10 MG tablet Take 1 tablet (10 mg total) by mouth daily. 03/28/19 06/12/19 Yes [place] Donnamarie Poag, DO  metFORMIN (GLUCOPHAGE) 500 MG tablet Take 1 tablet (500 mg total) by mouth daily with breakfast. 03/28/19 06/12/19 Yes [place] Donnamarie Poag, DO  SANTYL ointment Apply 1 application topically 2 (two) times daily as needed for rash. 06/03/19  Yes [provider]  vitamin C (ASCORBIC ACID) 250 MG  tablet Take 500 mg by mouth 2 (two) times daily.   Yes [provider]  zinc gluconate 50 MG tablet Take 100 mg by mouth daily.   Yes [provider]    Allergies    Patient has no known allergies.  Review of Systems   Review of Systems  Unable to perform ROS: Mental status change    Physical Exam Updated Vital Signs BP 108/65   Pulse 77   Resp (!) 22   SpO2 92%   Physical Exam Constitutional:      Comments: Decreased responsiveness, confused, somewhat lethargic  HENT:     Head: Normocephalic and atraumatic.     Nose: Nose normal.     Mouth/Throat:     Mouth: Mucous membranes are moist.  Eyes:     Extraocular Movements: Extraocular movements intact.     Pupils: Pupils are equal, round, and  reactive to light.  Cardiovascular:     Rate and Rhythm: Normal rate and regular rhythm.     Pulses: Normal pulses.  Pulmonary:     Comments: Coarse breath sounds bilaterally, mild tachypnea, occasional agonal and apneic spells Abdominal:     General: Abdomen is flat.     Palpations: Abdomen is soft.  Musculoskeletal:        General: No swelling or tenderness.     Cervical back: Normal range of motion and neck supple.  Skin:    Capillary Refill: Capillary refill takes less than 2 seconds.  Neurological:     Comments: Opens eyes to pain, makes incomprehensible sounds, localizes pain, moves all 4 extremities spontaneously     ED Results / Procedures / Treatments   Labs (all labs ordered are listed, but only abnormal results are displayed) Labs Reviewed  LACTIC ACID, PLASMA - Abnormal; Notable for the following components:      Result Value   Lactic Acid, Venous 2.5 (*)    All other components within normal limits  CBC WITH DIFFERENTIAL/PLATELET - Abnormal; Notable for the following components:   WBC 14.7 (*)    RBC 3.85 (*)    Hemoglobin 11.0 (*)    HCT 36.4 (*)    RDW 16.6 (*)    Neutro Abs 13.6 (*)    Abs Immature Granulocytes 0.10 (*)    All other components within normal limits  COMPREHENSIVE METABOLIC PANEL - Abnormal; Notable for the following components:   Sodium 147 (*)    Potassium 3.2 (*)    Glucose, Bld 178 (*)    BUN 66 (*)    Creatinine, Ser 1.49 (*)    Albumin 2.4 (*)    GFR calc non Af Amer 40 (*)    GFR calc Af Amer 47 (*)    All other components within normal limits  D-DIMER, QUANTITATIVE (NOT AT [facility]) - Abnormal; Notable for the following components:   D-Dimer, Quant 2.94 (*)    All other components within normal limits  FIBRINOGEN - Abnormal; Notable for the following components:   Fibrinogen >800 (*)    All other components within normal limits  BRAIN NATRIURETIC PEPTIDE - Abnormal; Notable for the following components:   B Natriuretic Peptide  147.7 (*)    All other components within normal limits  TROPONIN I (HIGH SENSITIVITY) - Abnormal; Notable for the following components:   Troponin I (High Sensitivity) 39 (*)    All other components within normal limits  CULTURE, BLOOD (ROUTINE X 2)  CULTURE, BLOOD (ROUTINE X 2)  PROCALCITONIN  LACTATE  DEHYDROGENASE  TRIGLYCERIDES  LACTIC ACID, PLASMA  FERRITIN  C-REACTIVE PROTEIN  TROPONIN I (HIGH SENSITIVITY)    EKG EKG Interpretation  Date/Time:  Monday June 12 2019 15:42:13 EST Ventricular Rate:  65 PR Interval:    QRS Duration: 123 QT Interval:  434 QTC Calculation: 452 R Axis:   34 Text Interpretation: Sinus rhythm Nonspecific intraventricular conduction delay Confirmed by Madalyn Rob [phone]) on 06/12/2019 4:11:15 PM   Radiology DG Chest Port 1 View  Result Date: 06/12/2019 CLINICAL DATA:  Shortness of breath.  COVID positive. EXAM: PORTABLE CHEST 1 VIEW COMPARISON:  None. FINDINGS: The heart size and mediastinal contours are within normal limits. Atherosclerotic calcification of the aortic arch. Normal pulmonary vascularity. There are few patchy opacities in the right mid lung. No pleural effusion or pneumothorax. Skin fold over the peripheral right lung. No acute osseous abnormality. IMPRESSION: 1. Few patchy opacities in the right mid lung, concerning for pneumonia. Electronically Signed   By: Titus Dubin M.D.   On: 06/12/2019 15:46    Procedures .Critical Care Performed by: Lucrezia Starch, MD Authorized by: Lucrezia Starch, MD   Critical care provider statement:    Critical care time (minutes):  45   Critical care was necessary to treat or prevent imminent or life-threatening deterioration of the following conditions:  Respiratory failure   Critical care was time spent personally by me on the following activities:  Discussions with consultants, evaluation of patient's response to treatment, examination of patient, ordering and performing  treatments and interventions, ordering and review of laboratory studies, ordering and review of radiographic studies, pulse oximetry, re-evaluation of patient's condition, obtaining history from patient or surrogate and review of old charts   (including critical care time)  Medications Ordered in ED Medications  dexamethasone (DECADRON) injection 6 mg (has no administration in time range)    ED Course  I have reviewed the triage vital signs and the nursing notes.  Pertinent labs & imaging results that were available during my care of the patient were reviewed by me and considered in my medical decision making (see chart for details).  Clinical Course as of Jun 13 1026  Mon Jun 12, 2019  1652 Discussed case with hospitalist, noted elevated procalcitonin, will start empiric antibiotics, give fluid bolus.  She will admit.   [RD]    Clinical Course User Index [RD] Lucrezia Starch, MD   MDM Rules/Calculators/A&P                      86 year old male with dementia presents to ER from nursing facility with worsening shortness of breath in setting of known COVID-19.  On exam patient noted to be somewhat tachypneic, occasional apneic spells, requiring up to 4 L nasal cannula.  Suspect symptoms are related to COVID-19 and underlying dementia.  Family confirms DNR.  Okay with other measures at this time.  Will start on steroids for covid pna. PCT elevated, will initiate broad spectrum abx. Mild elevation in lactic acid, will give small fluid bolus though more cautious given covid pna, will trend. Admit hospitalist.    Final Clinical Impression(s) / ED Diagnoses Final diagnoses:  Acute respiratory failure with hypoxia ([place])  COVID-19    Rx / DC Orders ED Discharge Orders    None       Lucrezia Starch, MD 06/12/19 [id number]    Lucrezia Starch, MD 06/14/19 1029

## 2019-06-12 NOTE — H&P (Signed)
History and Physical  Omar Gomez [id number] DOB: 30-Nov-1924 DOA: 06/12/2019   Patient coming from: SNF  Chief Complaint: Acute metabolic encephalopathy  HPI: Omar Gomez is a 86 y.o. male with medical history significant for dementia, AAA, diabetes mellitus, hypertension, hyperlipidemia, prostate CA, presents to the ER from SNF with complaints of worsening shortness of breath, hypoxia for the past few days.  Patient tested positive at SNF on 06/08/2019.  At the facility, O2 sats were noted to be about 88% on 2 L, increased to 5 L, saturating above 90%, found patient to also be hypotensive and altered. Pt was recently admitted here in 03/2019 due to failure to thrive/frequent falls and was discharged to SNF.  Patient unable to give any history.  Spoke to his spouse over the phone for more information   ED Course: Patient noted to be tachypneic, saturating 92% on 2 L of O2, temp not on file, labs showed sodium of 147, potassium 3.3, creatinine 1.49, baseline creatinine normal, troponin of 39, BNP 147.7, ferritin greater than 3000, CRP 18.2, lactic acid 2.5, procalcitonin 1.01, WBC 14.7, D-dimer 2.94, fibrinogen greater than 800, chest x-ray with few patchy opacities in the right midlung concerning for pneumonia.    Review of Systems: Review of systems not done due to encephalopathy with underlying dementia   Past Medical History:  Diagnosis Date  . AAA (abdominal aortic aneurysm) ([place])   . Anal fissure   . Biceps tendon rupture   . Bladder stone   . Blepharitis   . Cancer ([place])    PROSTATE CANCER TREATED WITH RADIATION  . Cataract   . Dermatitis contact, eyelid   . Diabetes mellitus without complication ([place])   . Hyperlipidemia   . Hypertension   . Kidney stones   . Narcolepsy   . OA (osteoarthritis) of knee   . Olecranon bursitis   . Shingles 2011   Past Surgical History:  Procedure Laterality Date  . FACIAL COSMETIC SURGERY     Lesion removed to rule out melanoma    . HERNIA REPAIR     1980    Social History:  reports that he quit smoking about 56 years ago. He has never used smokeless tobacco. He reports that he does not drink alcohol or use drugs.   No Known Allergies  Family History  Problem Relation Age of Onset  . Alcohol abuse Brother   . Heart disease Brother       Prior to Admission medications   Medication Sig Start Date End Date Taking? Authorizing Provider  acetaminophen (TYLENOL) 500 MG tablet Take 500 mg by mouth every 6 (six) hours as needed for mild pain or headache. Take 2 tablets every 6 hours.    Yes [provider]  aspirin EC 81 MG tablet Take 81 mg by mouth every other day.   Yes [provider]  atenolol (TENORMIN) 25 MG tablet Take 25 mg by mouth daily. 05/25/19  Yes [provider]  atorvastatin (LIPITOR) 80 MG tablet Take 1 tablet (80 mg total) by mouth daily. 03/28/19 06/12/19 Yes [place] Donnamarie Poag, DO  cholecalciferol (VITAMIN D) 25 MCG (1000 UNIT) tablet Take 2,000 Units by mouth daily.   Yes [provider]  dexamethasone (DECADRON) 4 MG tablet Take 4 mg by mouth daily.   Yes [provider]  ezetimibe (ZETIA) 10 MG tablet Take 1 tablet (10 mg total) by mouth daily. 03/28/19 06/12/19 Yes [place] Eric J, DO  metFORMIN (GLUCOPHAGE) 500 MG tablet  Take 1 tablet (500 mg total) by mouth daily with breakfast. 03/28/19 06/12/19 Yes [place] Donnamarie Poag, DO  SANTYL ointment Apply 1 application topically 2 (two) times daily as needed for rash. 06/03/19  Yes [provider]  vitamin C (ASCORBIC ACID) 250 MG tablet Take 500 mg by mouth 2 (two) times daily.   Yes [provider]  zinc gluconate 50 MG tablet Take 100 mg by mouth daily.   Yes [provider]    Physical Exam: BP 108/65   Pulse 77   Resp (!) 22   SpO2 92%   General: Mild distress, awake, lethargic, deconditioned Eyes: Normal ENT: Normal Neck: Supple Cardiovascular: S1, S2 present Respiratory:  Coarse breath sounds bilaterally Abdomen: Soft, nontender, nondistended, bowel sounds present Skin: Normal Musculoskeletal: No pedal edema bilaterally Psychiatric: Unable to assess Neurologic: Unable to follow commands          Labs on Admission:  Basic Metabolic Panel: Recent Labs  Lab 06/12/19 1528  NA 147*  K 3.2*  CL 110  CO2 23  GLUCOSE 178*  BUN 66*  CREATININE 1.49*  CALCIUM 8.9   Liver Function Tests: Recent Labs  Lab 06/12/19 1528  AST 38  ALT 35  ALKPHOS 91  BILITOT 0.9  PROT 7.5  ALBUMIN 2.4*   No results for input(s): LIPASE, AMYLASE in the last 168 hours. No results for input(s): AMMONIA in the last 168 hours. CBC: Recent Labs  Lab 06/12/19 1528  WBC 14.7*  NEUTROABS 13.6*  HGB 11.0*  HCT 36.4*  MCV 94.5  PLT 337   Cardiac Enzymes: No results for input(s): CKTOTAL, CKMB, CKMBINDEX, TROPONINI in the last 168 hours.  BNP (last 3 results) Recent Labs    06/12/19 1530  BNP 147.7*    ProBNP (last 3 results) No results for input(s): PROBNP in the last 8760 hours.  CBG: No results for input(s): GLUCAP in the last 168 hours.  Radiological Exams on Admission: DG Chest Port 1 View  Result Date: 06/12/2019 CLINICAL DATA:  Shortness of breath.  COVID positive. EXAM: PORTABLE CHEST 1 VIEW COMPARISON:  None. FINDINGS: The heart size and mediastinal contours are within normal limits. Atherosclerotic calcification of the aortic arch. Normal pulmonary vascularity. There are few patchy opacities in the right mid lung. No pleural effusion or pneumothorax. Skin fold over the peripheral right lung. No acute osseous abnormality. IMPRESSION: 1. Few patchy opacities in the right mid lung, concerning for pneumonia. Electronically Signed   By: Titus Dubin M.D.   On: 06/12/2019 15:46    EKG: Independently reviewed.  No acute ST changes  Assessment/Plan Present on Admission: . Pneumonia due to COVID-19 virus . Fall  Principal Problem:   Pneumonia due  to COVID-19 virus Active Problems:   Fall   Diabetes mellitus without complication ([place])   Sepsis likely 2/2 pneumonia due to COVID-19 virus Tachypneic, leukocytosis, lactic acidosis on admission Currently requiring about 2 to 3 L of oxygen to maintain sats above 90 Tested positive for COVID-19 virus on 06/08/2019 at SNF, repeat rapid pending Inflammatory markers elevated, CRP 18.2, ferritin greater than 3000, fibrinogen elevated, D-dimer 2.94, will trend Procalcitonin elevated at 1.01, will trend Chest x-ray with few patchy opacities in the right midlung concerning for pneumonia UA/UC pending BC x2 pending Start remdesivir per pharmacy, consider Actemra if oxygen requirement escalates Start IV Decadron Start broad-spectrum antibiotics, Vanco cefepime pending cultures, given elevated procalciton Inhalers, supplemental oxygen as needed Poor prognosis  AKI/lactic acidosis/dehydration Patient notably dehydrated likely  due to above Creatinine 1.49 on admission, normal at baseline Lactic acid 2.5, will trend Recommended EDP to start IV fluids, continue gentle hydration Daily CMP  Hypokalemia Replace as needed  Diabetes mellitus type 2 A1c pending SSI, Accu-Cheks, hypoglycemic protocol Hold home Metformin  Hypertension BP on the soft side Hold home antihypertensives for now IV fluids  Hyperlipidemia Hold home statins for now  AAA 6.4 cm Followed by Dr. Oneida Alar, continue outpatient follow-up   GOC/failure to thrive Very poor prognosis Palliative care consulted         DVT prophylaxis: Heparin  Code Status: DNR  Family Communication: Spoke to spouse on 06/12/2019, agreed with DNR status, but wants treatment to continue. Attempted to reach patient's grandsons wife who is an Therapist, sports here at [facility] ED, busy at the moment.  Her name is Lowry Ram, number is [phone]  Disposition Plan: To be determined  Consults called: Palliative  Admission status:  Inpatient    Alma Friendly MD [facility]   If 7PM-7AM, please contact night-coverage [email or web address]   06/12/2019, 5:24 PM

## 2019-06-12 NOTE — ED Triage Notes (Signed)
The patient is from [place]. He is covid +. At the facility, his O2 sat was 88% on 2L.They increased his O2 to 5 L and his O2 sats increased to 92%. The facility also found him to be hypotensive. When EMS arrived, they increased the O2 to 6L and sats increased to 99%. Patient is confused with a history of dementia.    EMS vitals:  126/50 BP 72 HR 97% O2 sat on 6L 276 CBG 98.8 Temp

## 2019-06-12 NOTE — Progress Notes (Signed)
Pharmacy Antibiotic Note  Omar Gomez is a 86 y.o. male admitted on 06/12/2019 with sepsis.  Pharmacy has been consulted for vancomycin + cefepime dosing.  Pt has PMH significant for dementia, DM, HTN, prostate cancer. Presenting to ED from SNF where he tested + for COVID-19 on 06/08/19. Empiric antibiotics initiated for sepsis, suspected source PNA.   Today, 06/12/19  WBC 14.7  SCr 1.49, CrCl 29 mL/min  Afebrile  Plan:  Cefepime 2 g IV q24h  Vancomycin 1000 mg dose given in ED. Order maintenance dose of 1500 mg IV q48h (stack initial dose)  Goal vancomycin AUC 400-550. Check vancomycin levels once at steady state if indicated.  Follow culture data. If MRSA PCR negative and respiratory is only suspected source of infection, recommend d/c vancomycin.   Follow renal function closely. Pt at risk for AKI given age and renal function.   Weight: 156 lb 9.6 oz (71 kg)  Temp (24hrs), Avg:98.7 F (37.1 C), Min:98.7 F (37.1 C), Max:98.7 F (37.1 C)  Recent Labs  Lab 06/12/19 1528 06/12/19 1530 06/12/19 1641  WBC 14.7*  --   --   CREATININE 1.49*  --   --   LATICACIDVEN  --  2.5* 2.5*    Estimated Creatinine Clearance: 29.6 mL/min (A) (by C-G formula based on SCr of 1.49 mg/dL (H)).    No Known Allergies  Antimicrobials this admission: vancomycin 1/11 >>  cefepime 1/11 >>   Dose adjustments this admission:  Microbiology results: 1/11 BCx: Sent 1/11 UCx: Sent  1/11 MRSA PCR: Briscoe Deutscher, PharmD 06/12/2019 8:11 PM

## 2019-06-12 NOTE — Progress Notes (Signed)
CRITICAL VALUE ALERT  Critical Value:  Lactic Acid 2.5  Date & Time Notied:  06/12/19    1620  Provider Notified: Roslynn Amble MD  Orders Received/Actions taken: none at this time

## 2019-06-12 NOTE — Progress Notes (Signed)
A consult was received from an ED physician for vancomycin + cefepime per pharmacy dosing.  The patient's profile has been reviewed for ht/wt/allergies/indication/available labs.  Ht/wt ordered, no recent wt in chart.  A one time order has been placed for cefepime 2 g IV once + vancomycin 1000 mg IV once.    Further antibiotics/pharmacy consults should be ordered by admitting physician if indicated.                       Thank you, Lenis Noon, PharmD 06/12/2019  5:05 PM

## 2019-06-13 DIAGNOSIS — I714 Abdominal aortic aneurysm, without rupture: Secondary | ICD-10-CM

## 2019-06-13 DIAGNOSIS — E876 Hypokalemia: Secondary | ICD-10-CM

## 2019-06-13 DIAGNOSIS — J1282 Pneumonia due to coronavirus disease 2019: Secondary | ICD-10-CM

## 2019-06-13 DIAGNOSIS — U071 COVID-19: Secondary | ICD-10-CM

## 2019-06-13 DIAGNOSIS — N179 Acute kidney failure, unspecified: Secondary | ICD-10-CM

## 2019-06-13 DIAGNOSIS — E119 Type 2 diabetes mellitus without complications: Secondary | ICD-10-CM

## 2019-06-13 LAB — COMPREHENSIVE METABOLIC PANEL
ALT: 33 U/L (ref 0–44)
AST: 40 U/L (ref 15–41)
Albumin: 2.1 g/dL — ABNORMAL LOW (ref 3.5–5.0)
Alkaline Phosphatase: 85 U/L (ref 38–126)
Anion gap: 14 (ref 5–15)
BUN: 63 mg/dL — ABNORMAL HIGH (ref 8–23)
CO2: 19 mmol/L — ABNORMAL LOW (ref 22–32)
Calcium: 8.4 mg/dL — ABNORMAL LOW (ref 8.9–10.3)
Chloride: 116 mmol/L — ABNORMAL HIGH (ref 98–111)
Creatinine, Ser: 1.34 mg/dL — ABNORMAL HIGH (ref 0.61–1.24)
GFR calc Af Amer: 53 mL/min — ABNORMAL LOW (ref >60)
GFR calc non Af Amer: 46 mL/min — ABNORMAL LOW (ref >60)
Glucose, Bld: 185 mg/dL — ABNORMAL HIGH (ref 70–99)
Potassium: 3.2 mmol/L — ABNORMAL LOW (ref 3.5–5.1)
Sodium: 149 mmol/L — ABNORMAL HIGH (ref 135–145)
Total Bilirubin: 0.6 mg/dL (ref 0.3–1.2)
Total Protein: 6.8 g/dL (ref 6.5–8.1)

## 2019-06-13 LAB — CBC WITH DIFFERENTIAL/PLATELET
Abs Immature Granulocytes: 0.08 10*3/uL — ABNORMAL HIGH (ref 0.00–0.07)
Basophils Absolute: 0 10*3/uL (ref 0.0–0.1)
Basophils Relative: 0 %
Eosinophils Absolute: 0 10*3/uL (ref 0.0–0.5)
Eosinophils Relative: 0 %
HCT: 34 % — ABNORMAL LOW (ref 39.0–52.0)
Hemoglobin: 10.2 g/dL — ABNORMAL LOW (ref 13.0–17.0)
Immature Granulocytes: 1 %
Lymphocytes Relative: 5 %
Lymphs Abs: 0.6 10*3/uL — ABNORMAL LOW (ref 0.7–4.0)
MCH: 28.7 pg (ref 26.0–34.0)
MCHC: 30 g/dL (ref 30.0–36.0)
MCV: 95.5 fL (ref 80.0–100.0)
Monocytes Absolute: 0.2 10*3/uL (ref 0.1–1.0)
Monocytes Relative: 2 %
Neutro Abs: 10.3 10*3/uL — ABNORMAL HIGH (ref 1.7–7.7)
Neutrophils Relative %: 92 %
Platelets: 295 10*3/uL (ref 150–400)
RBC: 3.56 MIL/uL — ABNORMAL LOW (ref 4.22–5.81)
RDW: 16.6 % — ABNORMAL HIGH (ref 11.5–15.5)
WBC: 11.2 10*3/uL — ABNORMAL HIGH (ref 4.0–10.5)
nRBC: 0 % (ref 0.0–0.2)

## 2019-06-13 LAB — CBG MONITORING, ED: Glucose-Capillary: 176 mg/dL — ABNORMAL HIGH (ref 70–99)

## 2019-06-13 LAB — C-REACTIVE PROTEIN: CRP: 19.9 mg/dL — ABNORMAL HIGH (ref <1.0)

## 2019-06-13 LAB — MRSA PCR SCREENING: MRSA by PCR: NEGATIVE

## 2019-06-13 LAB — GLUCOSE, CAPILLARY
Glucose-Capillary: 135 mg/dL — ABNORMAL HIGH (ref 70–99)
Glucose-Capillary: 148 mg/dL — ABNORMAL HIGH (ref 70–99)
Glucose-Capillary: 149 mg/dL — ABNORMAL HIGH (ref 70–99)

## 2019-06-13 LAB — D-DIMER, QUANTITATIVE: D-Dimer, Quant: 3.88 ug/mL-FEU — ABNORMAL HIGH (ref 0.00–0.50)

## 2019-06-13 LAB — FERRITIN: Ferritin: 2704 ng/mL — ABNORMAL HIGH (ref 24–336)

## 2019-06-13 MED ORDER — SODIUM CHLORIDE 0.9 % IV SOLN
2.0000 g | Freq: Two times a day (BID) | INTRAVENOUS | Status: DC
  Administered 2019-06-13 – 2019-06-14 (×3): 2 g via INTRAVENOUS
  Filled 2019-06-13 (×3): qty 2

## 2019-06-13 MED ORDER — ORAL CARE MOUTH RINSE
15.0000 mL | Freq: Two times a day (BID) | OROMUCOSAL | Status: DC
  Administered 2019-06-13 – 2019-06-21 (×14): 15 mL via OROMUCOSAL

## 2019-06-13 MED ORDER — CHLORHEXIDINE GLUCONATE CLOTH 2 % EX PADS
6.0000 | MEDICATED_PAD | Freq: Every day | CUTANEOUS | Status: DC
  Administered 2019-06-14 – 2019-06-18 (×5): 6 via TOPICAL

## 2019-06-13 NOTE — ED Notes (Signed)
Patient a small bowel movement. Peri care performed using non-rinse spray. Redness and skin breakdown noted to sacral area. Barrier cream and Mepilex applied for protection. Linens changed and patient repositioned in bed.

## 2019-06-13 NOTE — Progress Notes (Signed)
PROGRESS NOTE    Omar Gomez  [id number] DOB: Jun 27, 1924 DOA: 06/12/2019 PCP: Wenda Low, MD    Brief Narrative:  HPI: Omar Gomez is a 86 y.o. male with medical history significant for dementia, AAA, diabetes mellitus, hypertension, hyperlipidemia, prostate CA, presents to the ER from SNF with complaints of worsening shortness of breath, hypoxia for the past few days.  Patient tested positive at SNF on 06/08/2019.  At the facility, O2 sats were noted to be about 88% on 2 L, increased to 5 L, saturating above 90%, found patient to also be hypotensive and altered. Pt was recently admitted here in 03/2019 due to failure to thrive/frequent falls and was discharged to SNF.  Patient unable to give any history.  Spoke to his spouse over the phone for more information   ED Course: Patient noted to be tachypneic, saturating 92% on 2 L of O2, temp not on file, labs showed sodium of 147, potassium 3.3, creatinine 1.49, baseline creatinine normal, troponin of 39, BNP 147.7, ferritin greater than 3000, CRP 18.2, lactic acid 2.5, procalcitonin 1.01, WBC 14.7, D-dimer 2.94, fibrinogen greater than 800, chest x-ray with few patchy opacities in the right midlung concerning for pneumonia.  Review of system Unable to perform because the patient mental status History of severe dementia Assessment & Plan: elevated, D-dimer 2.94, will trend Procalcitonin elevated at 1.01, will trend Chest x-ray with few patchy opacities in the right midlung concerning for pneumonia UA/UC pending BC x2 pending Start remdesivir per pharmacy, consider Actemra if oxygen requirement escalates Start IV Decadron Start broad-spectrum antibiotics, Vanco cefepime pending cultures, given elevated procalciton Inhalers, supplemental oxygen as needed Poor prognosis  AKI/lactic acidosis/dehydration Patient notably dehydrated likely due to above Creatinine 1.49 on admission, normal at baseline Lactic acid 2.5, will  trend Recommended EDP to start IV fluids, continue gentle hydration Daily CMP  Hypokalemia Replace as needed  Diabetes mellitus type 2 A1c pending SSI, Accu-Cheks, hypoglycemic protocol Hold home Metformin  Hypertension BP on the soft side Hold home antihypertensives for now IV fluids  Hyperlipidemia Hold home statins for now    Principal Problem:   Pneumonia due to COVID-19 virus Active Problems:   Fall   Pressure injury of skin   Diabetes mellitus without complication ([place])      DVT prophylaxis: Lovenox Code Status: DNR Family Communication: not yet Disposition Plan: Transfer to [facility]   Consultants:   None  Procedures:    None  Antimicrobials:      Subjective: Patient with severe dementia unable to evaluate his mental status Apparently no distress  Objective: Vitals:   06/13/19 1230 06/13/19 1330 06/13/19 1432 06/13/19 1618  BP: (!) 142/71 (!) 136/93 (!) 107/56 135/82  Pulse: (!) 45 74  76  Resp: 17 (!) 25 18 20   Temp:   97.7 F (36.5 C) 98.3 F (36.8 C)  TempSrc:   Axillary Axillary  SpO2: 96% 95%  100%  Weight:        Intake/Output Summary (Last 24 hours) at 06/13/2019 1701 Last data filed at 06/13/2019 1530 Gross per 24 hour  Intake 1300 ml  Output 0 ml  Net 1300 ml   Filed Weights   06/12/19 2001  Weight: 71 kg    Examination:   General: Mild distress, awake, lethargic, deconditioned  Eyes: Normal  ENT: Normal  Neck: Supple  Cardiovascular: S1, S2 present  Respiratory: Coarse breath sounds bilaterally  Abdomen: Soft, nontender, nondistended, bowel sounds present  Skin: Normal  Musculoskeletal: No pedal edema bilaterally  Psychiatric: Unable to assess  Neurologic: Unable to follow commands    Data Reviewed: I have personally reviewed following labs and imaging studies  CBC: Recent Labs  Lab 06/12/19 1528 06/13/19 0431  WBC 14.7* 11.2*  NEUTROABS 13.6* 10.3*  HGB 11.0* 10.2*   HCT 36.4* 34.0*  MCV 94.5 95.5  PLT 337 [id number]   Basic Metabolic Panel: Recent Labs  Lab 06/12/19 1528 06/13/19 0431  NA 147* 149*  K 3.2* 3.2*  CL 110 116*  CO2 23 19*  GLUCOSE 178* 185*  BUN 66* 63*  CREATININE 1.49* 1.34*  CALCIUM 8.9 8.4*   GFR: Estimated Creatinine Clearance: 32.9 mL/min (A) (by C-G formula based on SCr of 1.34 mg/dL (H)). Liver Function Tests: Recent Labs  Lab 06/12/19 1528 06/13/19 0431  AST 38 40  ALT 35 33  ALKPHOS 91 85  BILITOT 0.9 0.6  PROT 7.5 6.8  ALBUMIN 2.4* 2.1*   No results for input(s): LIPASE, AMYLASE in the last 168 hours. No results for input(s): AMMONIA in the last 168 hours. Coagulation Profile: No results for input(s): INR, PROTIME in the last 168 hours. Cardiac Enzymes: No results for input(s): CKTOTAL, CKMB, CKMBINDEX, TROPONINI in the last 168 hours. BNP (last 3 results) No results for input(s): PROBNP in the last 8760 hours. HbA1C: Recent Labs    06/12/19 1722  HGBA1C 6.8*   CBG: Recent Labs  Lab 06/12/19 1857 06/12/19 1957 06/12/19 2304 06/13/19 0415  GLUCAP 136* 169* 149* 176*   Lipid Profile: Recent Labs    06/12/19 1530  TRIG 99   Thyroid Function Tests: No results for input(s): TSH, T4TOTAL, FREET4, T3FREE, THYROIDAB in the last 72 hours. Anemia Panel: Recent Labs    06/12/19 1530 06/13/19 0431  FERRITIN 3,103* 2,704*   Sepsis Labs: Recent Labs  Lab 06/12/19 1528 06/12/19 1530 06/12/19 1641  PROCALCITON 1.01  --   --   LATICACIDVEN  --  2.5* 2.5*    Recent Results (from the past 240 hour(s))  Blood Culture (routine x 2)     Status: None (Preliminary result)   Collection Time: 06/12/19  2:46 PM   Specimen: BLOOD  Result Value Ref Range Status   Specimen Description   Final    BLOOD RIGHT ANTECUBITAL Performed at [facility], [place] [address] [place], [place] [address]    Special Requests   Final    BOTTLES DRAWN AEROBIC AND ANAEROBIC Blood Culture results may not be optimal  due to an inadequate volume of blood received in culture bottles Performed at [facility] [address], [place], [place] [address]    Culture   Final    NO GROWTH < 24 HOURS Performed at [facility] [address], [place], [place] [address]    Report Status PENDING  Incomplete  Blood Culture (routine x 2)     Status: None (Preliminary result)   Collection Time: 06/12/19  3:30 PM   Specimen: Left Antecubital; Blood  Result Value Ref Range Status   Specimen Description   Final    LEFT ANTECUBITAL Performed at [facility] [address], [place], [place] [address]    Special Requests   Final    BOTTLES DRAWN AEROBIC AND ANAEROBIC Blood Culture adequate volume Performed at [facility] [address] [place], [place] [address]    Culture   Final    NO GROWTH < 24 HOURS Performed at  [facility], [place] [address], [place], [place] [address]    Report Status PENDING  Incomplete  MRSA PCR Screening     Status: None   Collection Time: 06/12/19  8:13 PM   Specimen: Nasopharyngeal  Result Value Ref Range Status   MRSA by PCR NEGATIVE NEGATIVE Final    Comment:        The GeneXpert MRSA Assay (FDA approved for NASAL specimens only), is one component of a comprehensive MRSA colonization surveillance program. It is not intended to diagnose MRSA infection nor to guide or monitor treatment for MRSA infections. Performed at [facility], [place] [address], [place], [place] [address]          Radiology Studies: [facility] Chest Port 1 View  Result Date: 06/12/2019 CLINICAL DATA:  Shortness of breath.  COVID positive. EXAM: PORTABLE CHEST 1 VIEW COMPARISON:  None. FINDINGS: The heart size and mediastinal contours are within normal limits. Atherosclerotic calcification of the aortic arch. Normal pulmonary vascularity. There are few patchy opacities in the right mid lung. No pleural effusion or  pneumothorax. Skin fold over the peripheral right lung. No acute osseous abnormality. IMPRESSION: 1. Few patchy opacities in the right mid lung, concerning for pneumonia. Electronically Signed   By: Titus Dubin M.D.   On: 06/12/2019 15:46        Scheduled Meds: . vitamin C  500 mg Oral Daily  . dexamethasone (DECADRON) injection  6 mg Intravenous Q24H  . heparin  5,000 Units Subcutaneous Q8H  . insulin aspart  0-9 Units Subcutaneous Q4H  . Ipratropium-Albuterol  1 puff Inhalation Q6H  . sodium chloride flush  3 mL Intravenous Q12H  . zinc sulfate  220 mg Oral Daily   Continuous Infusions: . sodium chloride 75 mL/hr at 06/13/19 1629  . ceFEPime (MAXIPIME) IV Stopped (06/13/19 1412)  . remdesivir 100 mg in NS 100 mL Stopped (06/13/19 1412)  . vancomycin       LOS: 1 day    Time spent: Less than 35 minutes    Assunta Found, MD [facility] Pager [phone]  If 7PM-7AM, please contact night-coverage [email or web address] Password [facility] 06/13/2019, 5:01 PM

## 2019-06-14 ENCOUNTER — Encounter (HOSPITAL_COMMUNITY): Payer: Self-pay | Admitting: Pharmacist

## 2019-06-14 LAB — COMPREHENSIVE METABOLIC PANEL
ALT: 32 U/L (ref 0–44)
AST: 48 U/L — ABNORMAL HIGH (ref 15–41)
Albumin: 2 g/dL — ABNORMAL LOW (ref 3.5–5.0)
Alkaline Phosphatase: 85 U/L (ref 38–126)
Anion gap: 12 (ref 5–15)
BUN: 61 mg/dL — ABNORMAL HIGH (ref 8–23)
CO2: 16 mmol/L — ABNORMAL LOW (ref 22–32)
Calcium: 8.5 mg/dL — ABNORMAL LOW (ref 8.9–10.3)
Chloride: 127 mmol/L — ABNORMAL HIGH (ref 98–111)
Creatinine, Ser: 1.09 mg/dL (ref 0.61–1.24)
GFR calc Af Amer: >60 mL/min (ref >60)
GFR calc non Af Amer: 59 mL/min — ABNORMAL LOW (ref >60)
Glucose, Bld: 145 mg/dL — ABNORMAL HIGH (ref 70–99)
Potassium: 3.4 mmol/L — ABNORMAL LOW (ref 3.5–5.1)
Sodium: 155 mmol/L — ABNORMAL HIGH (ref 135–145)
Total Bilirubin: 0.7 mg/dL (ref 0.3–1.2)
Total Protein: 6.5 g/dL (ref 6.5–8.1)

## 2019-06-14 LAB — CBC WITH DIFFERENTIAL/PLATELET
Abs Immature Granulocytes: 0.12 10*3/uL — ABNORMAL HIGH (ref 0.00–0.07)
Basophils Absolute: 0.1 10*3/uL (ref 0.0–0.1)
Basophils Relative: 1 %
Eosinophils Absolute: 0.1 10*3/uL (ref 0.0–0.5)
Eosinophils Relative: 1 %
HCT: 40.2 % (ref 39.0–52.0)
Hemoglobin: 11.1 g/dL — ABNORMAL LOW (ref 13.0–17.0)
Immature Granulocytes: 1 %
Lymphocytes Relative: 6 %
Lymphs Abs: 0.8 10*3/uL (ref 0.7–4.0)
MCH: 28.6 pg (ref 26.0–34.0)
MCHC: 27.6 g/dL — ABNORMAL LOW (ref 30.0–36.0)
MCV: 103.6 fL — ABNORMAL HIGH (ref 80.0–100.0)
Monocytes Absolute: 0.4 10*3/uL (ref 0.1–1.0)
Monocytes Relative: 3 %
Neutro Abs: 11.5 10*3/uL — ABNORMAL HIGH (ref 1.7–7.7)
Neutrophils Relative %: 88 %
Platelets: 244 10*3/uL (ref 150–400)
RBC: 3.88 MIL/uL — ABNORMAL LOW (ref 4.22–5.81)
RDW: 16.8 % — ABNORMAL HIGH (ref 11.5–15.5)
WBC: 13 10*3/uL — ABNORMAL HIGH (ref 4.0–10.5)
nRBC: 0 % (ref 0.0–0.2)

## 2019-06-14 LAB — GLUCOSE, CAPILLARY
Glucose-Capillary: 169 mg/dL — ABNORMAL HIGH (ref 70–99)
Glucose-Capillary: 157 mg/dL — ABNORMAL HIGH (ref 70–99)
Glucose-Capillary: 153 mg/dL — ABNORMAL HIGH (ref 70–99)
Glucose-Capillary: 132 mg/dL — ABNORMAL HIGH (ref 70–99)
Glucose-Capillary: 142 mg/dL — ABNORMAL HIGH (ref 70–99)

## 2019-06-14 LAB — FERRITIN: Ferritin: 3918 ng/mL — ABNORMAL HIGH (ref 24–336)

## 2019-06-14 LAB — D-DIMER, QUANTITATIVE: D-Dimer, Quant: 3.35 ug/mL-FEU — ABNORMAL HIGH (ref 0.00–0.50)

## 2019-06-14 LAB — C-REACTIVE PROTEIN: CRP: 15.4 mg/dL — ABNORMAL HIGH (ref <1.0)

## 2019-06-14 MED ORDER — BOOST / RESOURCE BREEZE PO LIQD CUSTOM
1.0000 | Freq: Three times a day (TID) | ORAL | Status: DC
  Administered 2019-06-15 (×2): 1 via ORAL
  Filled 2019-06-14 (×13): qty 1

## 2019-06-14 MED ORDER — SODIUM CHLORIDE 0.9 % IV SOLN: 1.0000 g | INTRAVENOUS | Status: DC

## 2019-06-14 MED ORDER — ATENOLOL 25 MG PO TABS
25.0000 mg | ORAL_TABLET | Freq: Every day | ORAL | Status: DC
  Administered 2019-06-15: 25 mg via ORAL
  Filled 2019-06-14 (×2): qty 1

## 2019-06-14 MED ORDER — SODIUM CHLORIDE 0.9 % IV SOLN
1.0000 g | INTRAVENOUS | Status: AC
  Administered 2019-06-15 – 2019-06-16 (×3): 1 g via INTRAVENOUS
  Filled 2019-06-14 (×3): qty 10

## 2019-06-14 NOTE — Telephone Encounter (Signed)
This encounter was created in error - please disregard.

## 2019-06-14 NOTE — Consult Note (Signed)
Consultation Note Date: 06/14/2019   Patient Name: Omar Gomez  DOB: Dec 11, 1924  MRN: [id number]  Age / Sex: 86 y.o., male  PCP: Wenda Low, MD Referring Physician: Cherene Altes, MD  Reason for Consultation: Establishing goals of care  HPI/Patient Profile: 86 y.o. male  with past medical history of dementia, AAA, DM, prostate cancer, HTN, HLD admitted on 06/12/2019 from SNF covid + with hypoxia and hypotension. Recent hospitalization 03/2019 due to failure to thrive and frequent falls discharged to SNF. Hospital admission for acute respiratory failure secondary to covid-19 with chest xray concerning for right midlung pneumonia, AKI/lactic acidosis, dehydration. Receiving IVF, antibiotic, steroid, and remdesivir. Palliative medicine consultation for goals of care.   Clinical Assessment and Goals of Care:  I have reviewed medical records and discussed with care team. Per Dr. Thereasa Solo, patient is awake but does not interact in a meaningful way with baseline dementia. No s/s of pain or distress. On 2L [place].  Spoke with wife, Omar Gomez via telephone to discuss diagnosis prognosis, [place], EOL wishes, disposition and options.  Introduced Palliative Medicine as specialized medical care for people living with serious illness. It focuses on providing relief from the symptoms and stress of a serious illness. The goal is to improve quality of life for both the patient and the family.  We discussed a brief life review of the patient. Married to [place] for 65 years! They have two daughters. 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 Omar Gomez shares that prior to progression of dementia, Abdurraheem was very sharp. He loved to read and read at least 3 books a week.    In 2020, patient started receiving testosterone injections with hx of prostate  cancer. Wife reports after his second injection in Spring 2020, his cognitive status significantly declined. He was hospitalized in October 2020 following a fall and his dementia has continued to progress with declining cognitive, functional, and nutritional status. He has been living at [facility] since admission in October.   Discussed events leading up to admission and course of hospitalization including diagnoses, interventions, plan of care including medications. Discussed disease trajectory of dementia and concern that although he is stable now, he will likely be at a new baseline following this acute illness (covid-19, pneumonia, UTI). We discussed baseline poor functional, cognitive, nutritional status prior to admission.    I attempted to elicit values and goals of care important to the patient. Advanced directives, concepts specific to code status, artifical feeding and hydration were discussed. Omar Gomez shares that Belmin has a documented living will and has spoken of his wishes against life-prolonging measures including life support or feeding tube. She confirms DNR code status. Documentation not on file. Requested from wife. She plans to fax or secure email his living will to PMT provider.   Again discussed plan of care, watchful waiting. Reassured of ongoing support from PMT. PMT contact information given.    SUMMARY OF RECOMMENDATIONS    Patient has  documented living will. Not on file. Requested from wife who plans to send to PMT provider. Wife confirms his wishes for DNR/DNI and no feeding tube.   Continue medical management and current plan of care. Watchful waiting.   PMT will continue to follow patient's clinical status. Ongoing GOC discussions pending clinical course.   Code Status/Advance Care Planning:  DNR  Symptom Management:   Per attending  Palliative Prophylaxis:   Aspiration, Delirium Protocol, Oral Care and Turn Reposition   Psycho-social/Spiritual:    Desire for further Chaplaincy support:yes  Additional Recommendations: Caregiving  Support/Resources, Compassionate Wean Education and Education on Hospice  Prognosis:   Guarded to poor  Discharge Planning: To Be Determined      Primary Diagnoses: Present on Admission: . Pneumonia due to COVID-19 virus . Fall . Pressure injury of skin   I have reviewed the medical record, interviewed the patient and family, and examined the patient. The following aspects are pertinent.  Past Medical History:  Diagnosis Date  . AAA (abdominal aortic aneurysm) ([place])   . Anal fissure   . Biceps tendon rupture   . Bladder stone   . Blepharitis   . Cancer ([place])    PROSTATE CANCER TREATED WITH RADIATION  . Cataract   . Dermatitis contact, eyelid   . Diabetes mellitus without complication ([place])   . Hyperlipidemia   . Hypertension   . Kidney stones   . Narcolepsy   . OA (osteoarthritis) of knee   . Olecranon bursitis   . Shingles 2011   Social History   Socioeconomic History  . Marital status: Married    Spouse name: Not on file  . Number of children: Not on file  . Years of education: Not on file  . Highest education level: Not on file  Occupational History  . Not on file  Tobacco Use  . Smoking status: Former Smoker    Quit date: 06/02/1963    Years since quitting: 56.0  . Smokeless tobacco: Never Used  Substance and Sexual Activity  . Alcohol use: No  . Drug use: No  . Sexual activity: Not on file  Other Topics Concern  . Not on file  Social History Narrative  . Not on file   Social Determinants of Health   Financial Resource Strain:   . Difficulty of Paying Living Expenses: Not on file  Food Insecurity:   . Worried About Charity fundraiser in the Last Year: Not on file  . Ran Out of Food in the Last Year: Not on file  Transportation Needs:   . Lack of Transportation (Medical): Not on file  . Lack of Transportation (Non-Medical): Not on file  Physical Activity:    . Days of Exercise per Week: Not on file  . Minutes of Exercise per Session: Not on file  Stress:   . Feeling of Stress : Not on file  Social Connections:   . Frequency of Communication with Friends and Family: Not on file  . Frequency of Social Gatherings with Friends and Family: Not on file  . Attends Religious Services: Not on file  . Active Member of Clubs or Organizations: Not on file  . Attends Archivist Meetings: Not on file  . Marital Status: Not on file   Family History  Problem Relation Age of Onset  . Alcohol abuse Brother   . Heart disease Brother    Scheduled Meds: . vitamin C  500 mg Oral Daily  . Chlorhexidine Gluconate Cloth  6 each Topical Daily  . dexamethasone (DECADRON) injection  6 mg Intravenous Q24H  . heparin  5,000 Units Subcutaneous Q8H  . insulin aspart  0-9 Units Subcutaneous Q4H  . Ipratropium-Albuterol  1 puff Inhalation Q6H  . mouth rinse  15 mL Mouth Rinse BID  . sodium chloride flush  3 mL Intravenous Q12H  . zinc sulfate  220 mg Oral Daily   Continuous Infusions: . sodium chloride 75 mL/hr at 06/14/19 0955  . ceFEPime (MAXIPIME) IV 2 g (06/14/19 0942)  . remdesivir 100 mg in NS 100 mL Stopped (06/13/19 1412)   PRN Meds:.acetaminophen, chlorpheniramine-HYDROcodone, guaiFENesin-dextromethorphan, labetalol, ondansetron **OR** ondansetron (ZOFRAN) IV, senna-docusate Medications Prior to Admission:  Prior to Admission medications   Medication Sig Start Date End Date Taking? Authorizing Provider  acetaminophen (TYLENOL) 500 MG tablet Take 500 mg by mouth every 6 (six) hours as needed for mild pain or headache. Take 2 tablets every 6 hours.    Yes [provider]  aspirin EC 81 MG tablet Take 81 mg by mouth every other day.   Yes [provider]  atenolol (TENORMIN) 25 MG tablet Take 25 mg by mouth daily. 05/25/19  Yes [provider]  atorvastatin (LIPITOR) 80 MG tablet Take 1 tablet (80 mg total) by mouth  daily. 03/28/19 06/12/19 Yes [place] Donnamarie Poag, DO  cholecalciferol (VITAMIN D) 25 MCG (1000 UNIT) tablet Take 2,000 Units by mouth daily.   Yes [provider]  dexamethasone (DECADRON) 4 MG tablet Take 4 mg by mouth daily.   Yes [provider]  ezetimibe (ZETIA) 10 MG tablet Take 1 tablet (10 mg total) by mouth daily. 03/28/19 06/12/19 Yes [place] Donnamarie Poag, DO  metFORMIN (GLUCOPHAGE) 500 MG tablet Take 1 tablet (500 mg total) by mouth daily with breakfast. 03/28/19 06/12/19 Yes [place] Donnamarie Poag, DO  SANTYL ointment Apply 1 application topically 2 (two) times daily as needed for rash. 06/03/19  Yes [provider]  vitamin C (ASCORBIC ACID) 250 MG tablet Take 500 mg by mouth 2 (two) times daily.   Yes [provider]  zinc gluconate 50 MG tablet Take 100 mg by mouth daily.   Yes [provider]   No Known Allergies Review of Systems  Unable to perform ROS: Dementia   Physical Exam  Vital Signs: BP (!) 171/48 (BP Location: Left Arm)   Pulse 83   Temp 98.5 F (36.9 C) (Axillary)   Resp 20   Wt 71 kg   SpO2 90%   BMI 24.53 kg/m  Pain Scale: 0-10 POSS *See Group Information*: 3-INTERVENTION REQUIRED,Unacceptable,Frequently drowsy, arousable, drifts off to sleep during conversation Pain Score: 0-No pain   SpO2: SpO2: 90 % O2 Device:SpO2: 90 % O2 Flow Rate: .O2 Flow Rate (L/min): 2 L/min  IO: Intake/output summary:   Intake/Output Summary (Last 24 hours) at 06/14/2019 1556 Last data filed at 06/14/2019 0720 Gross per 24 hour  Intake 1346.47 ml  Output 1175 ml  Net 171.47 ml    LBM: Last BM Date: 06/13/19 Baseline Weight: Weight: 71 kg Most recent weight: Weight: 71 kg     Palliative Assessment/Data: PPS 30%     Time In: 1630 Time Out: 1720 Time Total: 73min Greater than 50%  of this time was spent counseling and coordinating care related to the above assessment and plan.  The above conversation was completed via telephone due to  visitor restrictions during COVID-19 pandemic. Thorough chart review and discussion with multidisciplinary team was completed as part  of assessment. No physical examination was performed.   Signed by:  Ihor Dow, DNP, FNP-C Palliative Medicine Team  Phone: [phone] Fax: [phone]   Please contact Palliative Medicine Team phone at [phone] for questions and concerns.  For individual provider: See Shea Evans

## 2019-06-14 NOTE — TOC Progression Note (Signed)
Transition of Care [facility]) - Progression Note    Patient Details  Name: Omar Gomez MRN: [id number] Date of Birth: 1924-10-05  [facility]) CM/SW Contact  Shade Flood, LCSW Phone Number: 06/14/2019, 4:24 PM  Clinical Narrative:     Pt admitted from [facility] long term care. Pt with dementia. Anticipating pt will return to [facility] at dc.   TOC will follow.  Expected Discharge Plan: Skilled Nursing Facility Barriers to Discharge: Continued Medical Work up  Expected Discharge Plan and Services Expected Discharge Plan: [place]                                               Social Determinants of Health (SDOH) Interventions    Readmission Risk Interventions No flowsheet data found.

## 2019-06-14 NOTE — Progress Notes (Signed)
Omar Gomez  [id number] DOB: March 14, 1925 DOA: 06/12/2019 PCP: Wenda Low, MD    Brief Narrative:  86 year old SNF resident with a history of dementia, AAA, DM, HTN, HLD, and prostate cancer who presented to the ED with progressive shortness of breath over many days.  He was known to be Covid positive from testing at his facility 1/7.  In the emergency room he was found to be saturating 92% on 2 L nasal cannula support.  CXR noted scattered patchy opacities in the right midlung.  Significant Events: 1/7 Covid test positive at SNF  COVID-19 specific Treatment: Decadron 1/11 > Remdesivir 1/11 >  Antimicrobials:  Cefepime 1/11 > Vancomycin 1/11 >  Subjective: The patient is alert but does not interact with me in a meaningful way.  He does not appear to be uncomfortable or in distress.  Assessment & Plan:  Covid pneumonia -acute hypoxic respiratory failure Continue remdesivir and Decadron -antibiotics being dosed due to concern for bacterial pneumonia  Recent Labs  Lab 06/12/19 1528 06/12/19 1530 06/13/19 0431 06/14/19 1100  DDIMER 2.94*  --  3.88* 3.35*  FERRITIN  --  3,103* 2,704* 3,918*  CRP  --  18.2* 19.9* 15.4*  ALT 35  --  33 32  PROCALCITON 1.01  --   --   --     Acute kidney injury Creatinine slowly trending downward -monitor trend -likely prerenal azotemia Recent Labs  Lab 06/12/19 1528 06/13/19 0431 06/14/19 1100  CREATININE 1.49* 1.34* 1.09    Dehydration with lactic acidosis Continue judicious volume expansion  E. coli UTI Continue directed antibiotic therapy  Hypokalemia Hold on further supplementation until creatinine has further improved  DM 2 CBG reasonably controlled at this time  HTN Blood pressure trending upward -adjust treatment and follow  HLD  DVT prophylaxis: Subcutaneous heparin Code Status: FULL CODE Family Communication:  Disposition Plan: MedSurg bed  Consultants:  none  Objective: Blood pressure (!) 153/65,  pulse 83, temperature 98 F (36.7 C), temperature source Axillary, resp. rate 20, weight 71 kg, SpO2 90 %.  Intake/Output Summary (Last 24 hours) at 06/14/2019 1648 Last data filed at 06/14/2019 1600 Gross per 24 hour  Intake 1346.47 ml  Output 1675 ml  Net -328.53 ml   Filed Weights   06/12/19 2001  Weight: 71 kg    Examination: General: No acute respiratory distress Lungs: Fine crackles scattered diffusely Cardiovascular: Regular rate and rhythm without murmur gallop or rub normal S1 and S2 Abdomen: Nontender, nondistended, soft, bowel sounds positive, no rebound, no ascites, no appreciable mass Extremities: No significant cyanosis, clubbing, or edema bilateral lower extremities  CBC: Recent Labs  Lab 06/12/19 1528 06/13/19 0431 06/14/19 1100  WBC 14.7* 11.2* 13.0*  NEUTROABS 13.6* 10.3* 11.5*  HGB 11.0* 10.2* 11.1*  HCT 36.4* 34.0* 40.2  MCV 94.5 95.5 103.6*  PLT 337 295 [id number]   Basic Metabolic Panel: Recent Labs  Lab 06/12/19 1528 06/13/19 0431 06/14/19 1100  NA 147* 149* 155*  K 3.2* 3.2* 3.4*  CL 110 116* 127*  CO2 23 19* 16*  GLUCOSE 178* 185* 145*  BUN 66* 63* 61*  CREATININE 1.49* 1.34* 1.09  CALCIUM 8.9 8.4* 8.5*   GFR: Estimated Creatinine Clearance: 40.4 mL/min (by C-G formula based on SCr of 1.09 mg/dL).  Liver Function Tests: Recent Labs  Lab 06/12/19 1528 06/13/19 0431 06/14/19 1100  AST 38 40 48*  ALT 35 33 32  ALKPHOS 91 85 85  BILITOT 0.9 0.6 0.7  PROT 7.5 6.8  6.5  ALBUMIN 2.4* 2.1* 2.0*    HbA1C: Hgb A1c MFr Bld  Date/Time Value Ref Range Status  06/12/2019 05:22 PM 6.8 (H) 4.8 - 5.6 % Final    Comment:    (NOTE) Pre diabetes:          5.7%-6.4% Diabetes:              >6.4% Glycemic control for   <7.0% adults with diabetes   03/22/2019 10:04 PM 7.2 (H) 4.8 - 5.6 % Final    Comment:    (NOTE) Pre diabetes:          5.7%-6.4% Diabetes:              >6.4% Glycemic control for   <7.0% adults with diabetes      CBG: Recent Labs  Lab 06/13/19 2011 06/13/19 2344 06/14/19 0325 06/14/19 0813 06/14/19 1229  GLUCAP 135* 149* 169* 157* 153*    Recent Results (from the past 240 hour(s))  Blood Culture (routine x 2)     Status: None (Preliminary result)   Collection Time: 06/12/19  2:46 PM   Specimen: BLOOD  Result Value Ref Range Status   Specimen Description   Final    BLOOD RIGHT ANTECUBITAL Performed at [facility], [place] [address], [place], [place] [address]    Special Requests   Final    BOTTLES DRAWN AEROBIC AND ANAEROBIC Blood Culture results may not be optimal due to an inadequate volume of blood received in culture bottles Performed at [place] [address] [place], [place] [address]    Culture   Final    NO GROWTH 2 DAYS Performed at [place] [address], [place], [place] [address]    Report Status PENDING  Incomplete  Blood Culture (routine x 2)     Status: None (Preliminary result)   Collection Time: 06/12/19  3:30 PM   Specimen: Left Antecubital; Blood  Result Value Ref Range Status   Specimen Description   Final    LEFT ANTECUBITAL Performed at [place] [address] [place], [place] [address]    Special Requests   Final    BOTTLES DRAWN AEROBIC AND ANAEROBIC Blood Culture adequate volume Performed at [place] [address] [place], [place] [address]    Culture   Final    NO GROWTH 2 DAYS Performed at [place] [address], [place], [place] [address]    Report Status PENDING  Incomplete  Urine Culture     Status: Abnormal (Preliminary result)   Collection Time: 06/12/19  4:56 PM   Specimen: Urine, Random  Result Value Ref Range Status   Specimen Description   Final    URINE, RANDOM Performed at [place] [address] [place], [place] [address]    Special Requests   Final    NONE Performed at [facility], [place] [address] [place], [place] [address]    Culture (A)  Final    >=100,000 COLONIES/mL ESCHERICHIA COLI CULTURE REINCUBATED FOR BETTER GROWTH Performed at [facility], [place] [address] [place],  [address]    Report Status PENDING  Incomplete   Organism ID, Bacteria ESCHERICHIA COLI (A)  Final      Susceptibility   Escherichia coli - MIC*    AMPICILLIN 16 INTERMEDIATE Intermediate     CEFAZOLIN <=4 SENSITIVE Sensitive     CEFTRIAXONE <=0.25 SENSITIVE  Sensitive     CIPROFLOXACIN <=0.25 SENSITIVE Sensitive     GENTAMICIN <=1 SENSITIVE Sensitive     IMIPENEM <=0.25 SENSITIVE Sensitive     NITROFURANTOIN <=16 SENSITIVE Sensitive     TRIMETH/SULFA <=20 SENSITIVE Sensitive     AMPICILLIN/SULBACTAM 4 SENSITIVE Sensitive     PIP/TAZO <=4 SENSITIVE Sensitive     * >=100,000 COLONIES/mL ESCHERICHIA COLI  MRSA PCR Screening     Status: None   Collection Time: 06/12/19  8:13 PM   Specimen: Nasopharyngeal  Result Value Ref Range Status   MRSA by PCR NEGATIVE NEGATIVE Final    Comment:        The GeneXpert MRSA Assay (FDA approved for NASAL specimens only), is one component of a comprehensive MRSA colonization surveillance program. It is not intended to diagnose MRSA infection nor to guide or monitor treatment for MRSA infections. Performed at [facility] [place] [address], [place],  [address]      Scheduled Meds: . vitamin C  500 mg Oral Daily  . Chlorhexidine Gluconate Cloth  6 each Topical Daily  . dexamethasone (DECADRON) injection  6 mg Intravenous Q24H  . heparin  5,000 Units Subcutaneous Q8H  . insulin aspart  0-9 Units Subcutaneous Q4H  . Ipratropium-Albuterol  1 puff Inhalation Q6H  . mouth rinse  15 mL Mouth Rinse BID  . sodium chloride flush  3 mL Intravenous Q12H  . zinc sulfate  220 mg Oral Daily   Continuous Infusions: . sodium chloride 75 mL/hr at 06/14/19 0955  . ceFEPime (MAXIPIME) IV 2 g (06/14/19 0942)  .  remdesivir 100 mg in NS 100 mL 100 mg (06/14/19 1629)     LOS: 2 days   Cherene Altes, MD [facility] Office  [phone] Pager - Text Page per Amion  If 7PM-7AM, please contact night-coverage per Amion 06/14/2019, 4:48 PM

## 2019-06-14 NOTE — Progress Notes (Signed)
Initial Nutrition Assessment  DOCUMENTATION CODES:   Not applicable  INTERVENTION:    Boost Breeze po TID, each supplement provides 250 kcal and 9 grams of protein  NUTRITION DIAGNOSIS:   Increased nutrient needs related to acute illness(COVID-19) as evidenced by estimated needs.  GOAL:   Patient will meet greater than or equal to 90% of their needs  MONITOR:   PO intake, Supplement acceptance, Labs  REASON FOR ASSESSMENT:   Malnutrition Screening Tool    ASSESSMENT:   86 yo male admitted with SOB, hypoxia r/t COVID-19 PNA. Tested positive for COVID-19 on 1/7. PMH includes dementia, AAA, DM, HTN, HLD, prostate CA.   Diet has just been advanced to clear liquids. Will add Boost Breeze po supplement to maximize protein and calorie intake.  Weight encounters reviewed. Patient has lost 27 kg in the past 3 months. 27.6% weight loss in 3 months is significant for the time frame. Some of this weight loss is related to dehydration, however, suspect some weight loss has been related to poor intake.   Labs reviewed. Sodium 155 (H), potassium 3.4 (L), BUN 61 (H) CBG's: [phone]  Medications reviewed and include vitamin C, decadron, novolog SSI every 4 hours, zinc sulfate, IV rocephin, remdesivir.  Palliative care team has been consulted.   NUTRITION - FOCUSED PHYSICAL EXAM:  unable to complete  Diet Order:   Diet Order            Diet clear liquid Room service appropriate? Yes; Fluid consistency: Thin  Diet effective now              EDUCATION NEEDS:   Not appropriate for education at this time  Skin:  Skin Assessment: Skin Integrity Issues: Skin Integrity Issues:: Stage I, Stage II, Unstageable Stage I: sacrum Stage II: R buttock Unstageable: bilateral heels  Last BM:  1/12  Height:   Ht Readings from Last 1 Encounters:  06/14/19 5\' 7"  (1.702 m)    Weight:   Wt Readings from Last 1 Encounters:  06/12/19 71 kg    Ideal Body Weight:  67.3  kg  BMI:  Body mass index is 24.53 kg/m.  Estimated Nutritional Needs:   Kcal:  1900-2100  Protein:  95-110 gm  Fluid:  >/= 1.9 L    Molli Barrows, RD, LDN, [place] Pager [phone] After Hours Pager [phone]

## 2019-06-15 DIAGNOSIS — K922 Gastrointestinal hemorrhage, unspecified: Secondary | ICD-10-CM

## 2019-06-15 DIAGNOSIS — Z515 Encounter for palliative care: Secondary | ICD-10-CM

## 2019-06-15 DIAGNOSIS — J9601 Acute respiratory failure with hypoxia: Secondary | ICD-10-CM

## 2019-06-15 DIAGNOSIS — Z7189 Other specified counseling: Secondary | ICD-10-CM

## 2019-06-15 DIAGNOSIS — F0390 Unspecified dementia without behavioral disturbance: Secondary | ICD-10-CM

## 2019-06-15 LAB — CBC WITH DIFFERENTIAL/PLATELET
Abs Immature Granulocytes: 0.21 10*3/uL — ABNORMAL HIGH (ref 0.00–0.07)
Basophils Absolute: 0 10*3/uL (ref 0.0–0.1)
Basophils Relative: 0 %
Eosinophils Absolute: 0 10*3/uL (ref 0.0–0.5)
Eosinophils Relative: 0 %
HCT: 31.4 % — ABNORMAL LOW (ref 39.0–52.0)
Hemoglobin: 9.7 g/dL — ABNORMAL LOW (ref 13.0–17.0)
Immature Granulocytes: 1 %
Lymphocytes Relative: 6 %
Lymphs Abs: 1 10*3/uL (ref 0.7–4.0)
MCH: 29 pg (ref 26.0–34.0)
MCHC: 30.9 g/dL (ref 30.0–36.0)
MCV: 94 fL (ref 80.0–100.0)
Monocytes Absolute: 0.5 10*3/uL (ref 0.1–1.0)
Monocytes Relative: 3 %
Neutro Abs: 13.9 10*3/uL — ABNORMAL HIGH (ref 1.7–7.7)
Neutrophils Relative %: 90 %
Platelets: 314 10*3/uL (ref 150–400)
RBC: 3.34 MIL/uL — ABNORMAL LOW (ref 4.22–5.81)
RDW: 16.9 % — ABNORMAL HIGH (ref 11.5–15.5)
WBC: 15.7 10*3/uL — ABNORMAL HIGH (ref 4.0–10.5)
nRBC: 0 % (ref 0.0–0.2)

## 2019-06-15 LAB — URINE CULTURE: Culture: >=100000 — AB

## 2019-06-15 LAB — COMPREHENSIVE METABOLIC PANEL
ALT: 27 U/L (ref 0–44)
AST: 43 U/L — ABNORMAL HIGH (ref 15–41)
Albumin: 1.7 g/dL — ABNORMAL LOW (ref 3.5–5.0)
Alkaline Phosphatase: 78 U/L (ref 38–126)
Anion gap: 10 (ref 5–15)
BUN: 61 mg/dL — ABNORMAL HIGH (ref 8–23)
CO2: 16 mmol/L — ABNORMAL LOW (ref 22–32)
Calcium: 7.5 mg/dL — ABNORMAL LOW (ref 8.9–10.3)
Chloride: 129 mmol/L — ABNORMAL HIGH (ref 98–111)
Creatinine, Ser: 1.05 mg/dL (ref 0.61–1.24)
GFR calc Af Amer: >60 mL/min (ref >60)
GFR calc non Af Amer: >60 mL/min (ref >60)
Glucose, Bld: 123 mg/dL — ABNORMAL HIGH (ref 70–99)
Potassium: 2.6 mmol/L — CL (ref 3.5–5.1)
Sodium: 155 mmol/L — ABNORMAL HIGH (ref 135–145)
Total Bilirubin: 0.5 mg/dL (ref 0.3–1.2)
Total Protein: 5.5 g/dL — ABNORMAL LOW (ref 6.5–8.1)

## 2019-06-15 LAB — MAGNESIUM: Magnesium: 1.9 mg/dL (ref 1.7–2.4)

## 2019-06-15 LAB — CBC
HCT: 32.7 % — ABNORMAL LOW (ref 39.0–52.0)
Hemoglobin: 10 g/dL — ABNORMAL LOW (ref 13.0–17.0)
MCH: 28.6 pg (ref 26.0–34.0)
MCHC: 30.6 g/dL (ref 30.0–36.0)
MCV: 93.4 fL (ref 80.0–100.0)
Platelets: 326 10*3/uL (ref 150–400)
RBC: 3.5 MIL/uL — ABNORMAL LOW (ref 4.22–5.81)
RDW: 17 % — ABNORMAL HIGH (ref 11.5–15.5)
WBC: 16.6 10*3/uL — ABNORMAL HIGH (ref 4.0–10.5)
nRBC: 0 % (ref 0.0–0.2)

## 2019-06-15 LAB — GLUCOSE, CAPILLARY
Glucose-Capillary: 127 mg/dL — ABNORMAL HIGH (ref 70–99)
Glucose-Capillary: 127 mg/dL — ABNORMAL HIGH (ref 70–99)
Glucose-Capillary: 152 mg/dL — ABNORMAL HIGH (ref 70–99)
Glucose-Capillary: 135 mg/dL — ABNORMAL HIGH (ref 70–99)
Glucose-Capillary: 131 mg/dL — ABNORMAL HIGH (ref 70–99)

## 2019-06-15 LAB — D-DIMER, QUANTITATIVE: D-Dimer, Quant: 2.1 ug/mL-FEU — ABNORMAL HIGH (ref 0.00–0.50)

## 2019-06-15 LAB — FERRITIN: Ferritin: 3925 ng/mL — ABNORMAL HIGH (ref 24–336)

## 2019-06-15 LAB — C-REACTIVE PROTEIN: CRP: 10.6 mg/dL — ABNORMAL HIGH (ref <1.0)

## 2019-06-15 MED ORDER — POTASSIUM CHLORIDE 10 MEQ/100ML IV SOLN
10.0000 meq | INTRAVENOUS | Status: AC
  Administered 2019-06-15 (×5): 10 meq via INTRAVENOUS
  Filled 2019-06-15 (×2): qty 100

## 2019-06-15 MED ORDER — METOPROLOL TARTRATE 25 MG PO TABS
25.0000 mg | ORAL_TABLET | Freq: Two times a day (BID) | ORAL | Status: DC
  Filled 2019-06-15: qty 1

## 2019-06-15 MED ORDER — AMLODIPINE BESYLATE 10 MG PO TABS
10.0000 mg | ORAL_TABLET | Freq: Every day | ORAL | Status: DC
  Administered 2019-06-15 – 2019-06-17 (×3): 10 mg via ORAL
  Filled 2019-06-15 (×3): qty 1

## 2019-06-15 NOTE — Progress Notes (Signed)
Omar Gomez  [id number] DOB: June 25, 1924 DOA: 06/12/2019 PCP: Wenda Low, MD    Brief Narrative:  86 year old SNF resident with a history of dementia, AAA, DM, HTN, HLD, and prostate cancer who presented to the ED with progressive shortness of breath over many days.  He was known to be Covid positive from testing at his facility 1/7.  In the emergency room he was found to be saturating 92% on 2 L nasal cannula support.  CXR noted scattered patchy opacities in the right midlung.  Significant Events: 1/7 Covid test positive at [facility] 1/11 admit via [facility] ED 1/12 transfer to [facility]  COVID-19 specific Treatment: Decadron 1/11 > Remdesivir 1/11 > 1/15  Antimicrobials:  Cefepime 1/11 > 1/13 Rocephin 1/13 > Vancomycin 1/11 > 1/12  Subjective: Passing some blood clots per rectum this morning.  Hemoglobin has declined slightly, but patient has also been volume expanded. Does not appear uncomfortable at the time of my exam. Is not able to provide a reliable hx. No resp distress appreciated.   Assessment & Plan:  Covid pneumonia -acute hypoxic respiratory failure Continue remdesivir and decadron - antibiotic being dosed due to concern for bacterial pneumonia - stopped vancomycin as MRSA screen negative  Recent Labs  Lab 06/12/19 1528 06/12/19 1530 06/13/19 0431 06/14/19 1100 06/15/19 0157  DDIMER 2.94*  --  3.88* 3.35* 2.10*  FERRITIN  --  3,103* 2,704* 3,918* 3,925*  CRP  --  18.2* 19.9* 15.4* 10.6*  ALT 35  --  33 32 27  PROCALCITON 1.01  --   --   --   --     Acute kidney injury likely prerenal azotemia -steadily improving with volume expansion -continue to monitor trend  Recent Labs  Lab 06/12/19 1528 06/13/19 0431 06/14/19 1100 06/15/19 0157  CREATININE 1.49* 1.34* 1.09 1.05    Possible low-grade GI bleed Discontinue subcutaneous heparin for DVT prophylaxis - place SCDs -Hgb stable on recheck at noon - patient hemodynamically stable at present  Dehydration with  lactic acidosis Continue judicious volume expansion as oral intake is limited  E. coli UTI POA (cx collected 1/11) Continue directed antibiotic therapy w/ rocephin   Hypokalemia Severe as of this morning -  Mg is normal - replace further   DM 2 CBG reasonably controlled at this time  HTN Gently adjust blood pressure treatment and follow trend  HLD  DVT prophylaxis: SCDs Code Status: FULL CODE Family Communication:  Disposition Plan: MedSurg bed  Consultants:  Palliative Care   Objective: Blood pressure 119/77, pulse (!) 57, temperature 97.8 F (36.6 C), temperature source Oral, resp. rate 19, height 5\' 7"  (1.702 m), weight 71 kg, SpO2 95 %.  Intake/Output Summary (Last 24 hours) at 06/15/2019 1630 Last data filed at 06/15/2019 1600 Gross per 24 hour  Intake 0 ml  Output 1870 ml  Net -1870 ml   Filed Weights   06/12/19 2001  Weight: 71 kg    Examination: General: No acute respiratory distress Lungs: Fine crackles scattered diffusely w/o change  Cardiovascular: RRR Abdomen: soft, bs+, no mass, non-distended  Extremities: No significant edema B LE   CBC: Recent Labs  Lab 06/13/19 0431 06/13/19 0431 06/14/19 1100 06/15/19 0157 06/15/19 1200  WBC 11.2*   < > 13.0* 15.7* 16.6*  NEUTROABS 10.3*  --  11.5* 13.9*  --   HGB 10.2*   < > 11.1* 9.7* 10.0*  HCT 34.0*   < > 40.2 31.4* 32.7*  MCV 95.5   < > 103.6*  94.0 93.4  PLT 295   < > 244 314 326   < > = values in this interval not displayed.   Basic Metabolic Panel: Recent Labs  Lab 06/13/19 0431 06/14/19 1100 06/15/19 0157  NA 149* 155* 155*  K 3.2* 3.4* 2.6*  CL 116* 127* 129*  CO2 19* 16* 16*  GLUCOSE 185* 145* 123*  BUN 63* 61* 61*  CREATININE 1.34* 1.09 1.05  CALCIUM 8.4* 8.5* 7.5*  MG  --   --  1.9   GFR: Estimated Creatinine Clearance: 42 mL/min (by C-G formula based on SCr of 1.05 mg/dL).  Liver Function Tests: Recent Labs  Lab 06/12/19 1528 06/13/19 0431 06/14/19 1100 06/15/19 0157    AST 38 40 48* 43*  ALT 35 33 32 27  ALKPHOS 91 85 85 78  BILITOT 0.9 0.6 0.7 0.5  PROT 7.5 6.8 6.5 5.5*  ALBUMIN 2.4* 2.1* 2.0* 1.7*    HbA1C: Hgb A1c MFr Bld  Date/Time Value Ref Range Status  06/12/2019 05:22 PM 6.8 (H) 4.8 - 5.6 % Final    Comment:    (NOTE) Pre diabetes:          5.7%-6.4% Diabetes:              >6.4% Glycemic control for   <7.0% adults with diabetes   03/22/2019 10:04 PM 7.2 (H) 4.8 - 5.6 % Final    Comment:    (NOTE) Pre diabetes:          5.7%-6.4% Diabetes:              >6.4% Glycemic control for   <7.0% adults with diabetes     CBG: Recent Labs  Lab 06/14/19 1641 06/14/19 1949 06/15/19 0413 06/15/19 0717 06/15/19 1127  GLUCAP 132* 142* 127* 127* 152*    Recent Results (from the past 240 hour(s))  Blood Culture (routine x 2)     Status: None (Preliminary result)   Collection Time: 06/12/19  2:46 PM   Specimen: BLOOD  Result Value Ref Range Status   Specimen Description   Final    BLOOD RIGHT ANTECUBITAL Performed at [facility], [place] [address] [place], [place] [address]    Special Requests   Final    BOTTLES DRAWN AEROBIC AND ANAEROBIC Blood Culture results may not be optimal due to an inadequate volume of blood received in culture bottles Performed at [place] [address] [place], [place] [address]    Culture   Final    NO GROWTH 3 DAYS Performed at [facility], [place] [address], [place], [place] [address]    Report Status PENDING  Incomplete  Blood Culture (routine x 2)     Status: None (Preliminary result)   Collection Time: 06/12/19  3:30 PM   Specimen: Left Antecubital; Blood  Result Value Ref Range Status   Specimen Description   Final    LEFT ANTECUBITAL Performed at [facility] [address], [place], [place] [address]    Special Requests   Final    BOTTLES DRAWN AEROBIC AND ANAEROBIC Blood Culture adequate volume Performed at [facility] [address], [place], [place] [address]    Culture   Final    NO GROWTH 3 DAYS Performed at [facility], [place] [address], [place],  [address]    Report Status PENDING  Incomplete  Urine Culture     Status: Abnormal   Collection Time:  06/12/19  4:56 PM   Specimen: Urine, Random  Result Value Ref Range Status   Specimen Description   Final    URINE, RANDOM Performed at [facility] [address], [place], [place] [address]    Special Requests   Final    NONE Performed at [facility], [place] [address] [place], [place] [address]    Culture >=100,000 COLONIES/mL ESCHERICHIA COLI (A)  Final   Report Status 06/15/2019 FINAL  Final   Organism ID, Bacteria ESCHERICHIA COLI (A)  Final      Susceptibility   Escherichia coli - MIC*    AMPICILLIN 16 INTERMEDIATE Intermediate     CEFAZOLIN <=4 SENSITIVE Sensitive     CEFTRIAXONE <=0.25 SENSITIVE Sensitive     CIPROFLOXACIN <=0.25 SENSITIVE Sensitive     GENTAMICIN <=1 SENSITIVE Sensitive     IMIPENEM <=0.25 SENSITIVE Sensitive     NITROFURANTOIN <=16 SENSITIVE Sensitive     TRIMETH/SULFA <=20 SENSITIVE Sensitive     AMPICILLIN/SULBACTAM 4 SENSITIVE Sensitive     PIP/TAZO <=4 SENSITIVE Sensitive     * >=100,000 COLONIES/mL ESCHERICHIA COLI  MRSA PCR Screening     Status: None   Collection Time: 06/12/19  8:13 PM   Specimen: Nasopharyngeal  Result Value Ref Range Status   MRSA by PCR NEGATIVE NEGATIVE Final    Comment:        The GeneXpert MRSA Assay (FDA approved for NASAL specimens only), is one component of a comprehensive MRSA colonization surveillance program. It is not intended to diagnose MRSA infection nor to guide or monitor treatment for MRSA infections. Performed at [facility], [place] [address] [place], [place] [address]      Scheduled Meds: . amLODipine  10 mg Oral Daily  . vitamin C  500 mg Oral Daily  . Chlorhexidine Gluconate  Cloth  6 each Topical Daily  . dexamethasone (DECADRON) injection  6 mg Intravenous Q24H  . feeding supplement  1 Container Oral TID BM  . insulin aspart  0-9 Units Subcutaneous Q4H  . Ipratropium-Albuterol  1 puff Inhalation Q6H  . mouth rinse  15 mL Mouth Rinse BID  . sodium chloride flush  3 mL Intravenous Q12H  . zinc sulfate  220 mg Oral Daily   Continuous Infusions: . sodium chloride 50 mL/hr at 06/15/19 1008  . cefTRIAXone (ROCEPHIN)  IV 1 g (06/15/19 0035)  . remdesivir 100 mg in NS 100 mL 100 mg (06/15/19 0814)     LOS: 3 days   Cherene Altes, MD [facility] Office  [phone] Pager - Text Page per Amion  If 7PM-7AM, please contact night-coverage per Amion 06/15/2019, 4:30 PM

## 2019-06-15 NOTE — Consult Note (Signed)
   [facility] CM Inpatient Consult   06/15/2019  BERNHARD SPEAK 14-Nov-1924 [id number]   Patient screened for risk score of 18% unplanned readmission with 2 hospitalizations in the past 6 months under his [place].    Review of medical record shows from transition of care SW note that patient is a long term care resident at [facility]. Patient with dementia, and anticipating return to [facility] at discharge.   Plan:  Will sign off as patient has no follow-up needs for [facility] in a facility.  Primary Care Provider is Dr. Wenda Low with [facility]. Palliative care following patient for this admission.   For questions or changes, please call:  Edwena Felty A. Berthold Glace, BSN, RN-BC [facility] Liaison Cell: [phone]

## 2019-06-15 NOTE — Progress Notes (Signed)
Daily Progress Note   Patient Name: Omar Gomez       Date: 06/15/2019 DOB: 04-10-1925  Age: 86 y.o. MRN#: [id number] Attending Physician: Cherene Altes, MD Primary Care Physician: Wenda Low, MD Admit Date: 06/12/2019  Reason for Consultation/Follow-up: Establishing goals of care  Subjective: Per RN report, patient with GI bleeding today. Hgb stable. He remains confused and nonverbal. He is refusing oral intake. Now on 4L [place].  GOC:  Received call from wife, [name]. Provided update and discussed course of hospitalization including diagnoses, interventions, plan of care. Explained concerns with GI bleeding today and ongoing poor cognitive, nutritional, and functional status. Discussed current medication regimen and ongoing medical management.    [name] shares that she is "not surprised" by this call and his worsening status explaining that it is often bad news when she receives updates about Nuriel. We discussed how quickly his health has declined in the last few months with progression of dementia. Again reviewed dementia disease trajectory.   Rosemarie confirms DNR/DNI and he would not wish for feeding tube. She is ok for ongoing medical management inpatient. We briefly discussed comfort/hospice if his condition does not improve. [name] shares that she is very close with their daughters and has been providing daily updates. Reassured of ongoing palliative support.   Length of Stay: 3  Current Medications: Scheduled Meds:  . amLODipine  10 mg Oral Daily  . vitamin C  500 mg Oral Daily  . Chlorhexidine Gluconate Cloth  6 each Topical Daily  . dexamethasone (DECADRON) injection  6 mg Intravenous Q24H  . feeding supplement  1 Container Oral TID BM  . insulin aspart  0-9  Units Subcutaneous Q4H  . Ipratropium-Albuterol  1 puff Inhalation Q6H  . mouth rinse  15 mL Mouth Rinse BID  . sodium chloride flush  3 mL Intravenous Q12H  . zinc sulfate  220 mg Oral Daily    Continuous Infusions: . sodium chloride 50 mL/hr at 06/15/19 1008  . cefTRIAXone (ROCEPHIN)  IV 1 g (06/15/19 0035)  . remdesivir 100 mg in NS 100 mL 100 mg (06/15/19 0814)    PRN Meds: acetaminophen, chlorpheniramine-HYDROcodone, guaiFENesin-dextromethorphan, ondansetron **OR** ondansetron (ZOFRAN) IV, senna-docusate  Physical Exam          Vital Signs: BP 119/77   Pulse (!) 57  Temp 97.7 F (36.5 C) (Axillary)   Resp 19   Ht 5\' 7"  (1.702 m)   Wt 71 kg   SpO2 95%   BMI 24.53 kg/m  SpO2: SpO2: 95 % O2 Device: O2 Device: Nasal Cannula O2 Flow Rate: O2 Flow Rate (L/min): 2 L/min  Intake/output summary:   Intake/Output Summary (Last 24 hours) at 06/15/2019 1550 Last data filed at 06/15/2019 1230 Gross per 24 hour  Intake 0 ml  Output 1970 ml  Net -1970 ml   LBM: Last BM Date: 06/15/19 Baseline Weight: Weight: 71 kg Most recent weight: Weight: 71 kg       Palliative Assessment/Data: PPS 20%      Patient Active Problem List   Diagnosis Date Noted  . Palliative care by specialist   . Goals of care, counseling/discussion   . Acute respiratory failure with hypoxia ([place])   . Dementia without behavioral disturbance ([place])   . Pneumonia due to COVID-19 virus 06/12/2019  . Diabetes mellitus without complication ([place])   . Pressure injury of skin 03/25/2019  . Fall 03/22/2019  . AAA (abdominal aortic aneurysm) without rupture ([place]) 10/12/2013    Palliative Care Assessment & Plan   Patient Profile: 86 y.o. male  with past medical history of dementia, AAA, DM, prostate cancer, HTN, HLD admitted on 06/12/2019 from SNF covid + with hypoxia and hypotension. Recent hospitalization 03/2019 due to failure to thrive and frequent falls discharged to SNF. Hospital admission for acute  respiratory failure secondary to covid-19 with chest xray concerning for right midlung pneumonia, AKI/lactic acidosis, dehydration. Receiving IVF, antibiotic, steroid, and remdesivir. Palliative medicine consultation for goals of care.   Assessment: Acute hypoxic respiratory failure Covid-19 pneumonia AKI Dehydration Lactic acidosis E. Coli UTI Hypokalemia HTN HLD GI bleeding  Recommendations/Plan:  Patient has documented living will. Not on file. Requested from wife who plans to send to PMT provider. Wife confirms his wishes for DNR/DNI and no feeding tube.   Continue medical management and current plan of care. Watchful waiting.   PMT will continue to follow patient's clinical status. Ongoing GOC discussions pending clinical course. Provided update to wife today.  Code Status: DNR/DNI   Code Status Orders  (From admission, onward)         Start     Ordered   06/12/19 2200  Do not attempt resuscitation (DNR)  Continuous    Question Answer Comment  In the event of cardiac or respiratory ARREST Do not call a "code blue"   In the event of cardiac or respiratory ARREST Do not perform Intubation, CPR, defibrillation or ACLS   In the event of cardiac or respiratory ARREST Use medication by any route, position, wound care, and other measures to relive pain and suffering. May use oxygen, suction and manual treatment of airway obstruction as needed for comfort.      06/12/19 2159        Code Status History    Date Active Date Inactive Code Status Order ID Comments User Context   06/12/2019 1633 06/12/2019 2159 DNR [id number]  Lucrezia Starch, MD ED   03/22/2019 1739 03/28/2019 1729 DNR [id number]  Georgette Shell, MD ED   Advance Care Planning Activity       Prognosis:  Guarded to poor  Discharge Planning:  To Be Determined  Care plan was discussed with RN, wife  Thank you for allowing the Palliative Medicine Team to assist in the care of this patient.  The  above conversation was  completed via telephone due to visitor restrictions during COVID-19 pandemic. Thorough chart review and discussion with multidisciplinary team was completed as part of assessment. No physical examination was performed.    Time In: 1525- Time Out: 1555 Total Time 30 Prolonged Time Billed  no      Greater than 50%  of this time was spent counseling and coordinating care related to the above assessment and plan.  Ihor Dow, DNP, FNP-C Palliative Medicine Team  Phone: [phone] Fax: [phone]  Please contact Palliative Medicine Team phone at [phone] for questions and concerns.

## 2019-06-15 NOTE — Plan of Care (Signed)
  Problem: Education: Goal: Knowledge of risk factors and measures for prevention of condition will improve Outcome: Progressing   Problem: Respiratory: Goal: Will maintain a patent airway Outcome: Progressing Goal: Complications related to the disease process, condition or treatment will be avoided or minimized Outcome: Progressing   Problem: Education: Goal: Knowledge of General Education information will improve Description: Including pain rating scale, medication(s)/side effects and non-pharmacologic comfort measures Outcome: Progressing   Problem: Health Behavior/Discharge Planning: Goal: Ability to manage health-related needs will improve Outcome: Progressing   Problem: Clinical Measurements: Goal: Ability to maintain clinical measurements within normal limits will improve Outcome: Progressing Goal: Diagnostic test results will improve Outcome: Progressing Goal: Respiratory complications will improve Outcome: Progressing   Problem: Safety: Goal: Ability to remain free from injury will improve Outcome: Progressing   Problem: Skin Integrity: Goal: Risk for impaired skin integrity will decrease Outcome: Progressing

## 2019-06-15 NOTE — Progress Notes (Signed)
OT Cancellation Note  Patient Details Name: Omar Gomez MRN: [id number] DOB: [birth year]   Cancelled Treatment:    Reason Eval/Treat Not Completed: Medical issues which prohibited therapy. Pt with rectal bleed.K+ being replaced. Will hold today.  Attempted to contact [facility] regarding PLOF. Will assess if appropriate.   Kimara Bencomo,HILLARY 06/15/2019, 12:15 PM

## 2019-06-15 NOTE — Evaluation (Signed)
Physical Therapy Evaluation Patient Details Name: Omar Gomez MRN: [id number] DOB: 12/12/1924 Today's Date: 06/15/2019   History of Present Illness  86 y/o male pt w/ hx of shingles, OA, narcolepsy, HTN, HLD, DM, cataract, cancer, blepharitis, AAA presented to ED w/ progressive SOB, he was known to be COVID + from 1/7 at [facility]. Pt admitted with COVID PNA w/ acute hypoxemic respiratory failure  Clinical Impression   Pt was living at SNF prior to admission and from deconditioned state was most likely assisted with all ADLs and IADLs. From wounds noted on body he was bed or w/c bound, therapists attempted to contact facility but representatives were unavailable to verify PLOF. This am pt is wide eyed but not interacting with own environment or therapist, attempted multi stimuli and pt minimally responsive to all. Pt needed total assist with all mobility including rolling in bed and repositioning to safety and comfort. At this time pt does not need skilled acute care level PT, he he functioning at what appears to be his baseline level. Should pt condition change therapist will reassess and treat as indicated. Pt may dc back to home SNF when able, where he will receive all care he needs as previous.     Follow Up Recommendations SNF;Supervision/Assistance - 24 hour    Equipment Recommendations  None recommended by PT    Recommendations for Other Services       Precautions / Restrictions Precautions Precautions: Fall;Other (comment) Precaution Comments: 02 sats fluctuate, HR fluctuates Restrictions Weight Bearing Restrictions: No      Mobility  Bed Mobility Overal bed mobility: Needs Assistance Bed Mobility: Rolling Rolling: Total assist            Transfers Overall transfer level: Needs assistance               General transfer comment: pt is total assist during this assessment and is better to be moved using maxi mover for safety  Ambulation/Gait                 Stairs            Wheelchair Mobility    Modified Rankin (Stroke Patients Only)       Balance Overall balance assessment: History of Falls;Needs assistance(unable to tolerate either sitting nor standing)   Sitting balance-Leahy Scale: Zero       Standing balance-Leahy Scale: Zero                               Pertinent Vitals/Pain Pain Assessment: Faces Faces Pain Scale: Hurts whole lot Pain Location: w/ some limb mobility or attempted repositioning in bed Pain Intervention(s): Limited activity within patient's tolerance;Monitored during session    [place] expects to be discharged to:: Assisted living                 Additional Comments: Pt is from skilled facility, therapists attempted to contact facility to determine PLOF but facility was not available    Prior Function Level of Independence: Needs assistance         Comments: pt does not seem as if he has been mobile in some time now, from wounds noted on body and condition of limbs      Hand Dominance        Extremity/Trunk Assessment   Upper Extremity Assessment Upper Extremity Assessment: Defer to OT evaluation    Lower Extremity Assessment Lower Extremity Assessment: (attempted  PROM to BLE and pt is extremely limited)       Communication   Communication: Receptive difficulties;Expressive difficulties  Cognition Arousal/Alertness: Awake/alert(eyes open minimally interacts with environment or therapist) Behavior During Therapy: Agitated;Restless Overall Cognitive Status: No family/caregiver present to determine baseline cognitive functioning                                 General Comments: does not seem to be as descibed in chart and no SNF representative available to verify PLOF      General Comments      Exercises     Assessment/Plan    PT Assessment All further PT needs can be met in the next venue of care  PT Problem List  Decreased strength;Decreased activity tolerance;Decreased range of motion;Decreased balance;Decreased mobility;Decreased coordination;Decreased cognition;Decreased knowledge of use of DME;Decreased safety awareness       PT Treatment Interventions      PT Goals (Current goals can be found in the Care Plan section)       Frequency     Barriers to discharge        Co-evaluation               AM-PAC PT "6 Clicks" Mobility  Outcome Measure Help needed turning from your back to your side while in a flat bed without using bedrails?: Total Help needed moving from lying on your back to sitting on the side of a flat bed without using bedrails?: Total Help needed moving to and from a bed to a chair (including a wheelchair)?: Total Help needed standing up from a chair using your arms (e.g., wheelchair or bedside chair)?: Total Help needed to walk in hospital room?: Total Help needed climbing 3-5 steps with a railing? : Total 6 Click Score: 6    End of Session Equipment Utilized During Treatment: Oxygen Activity Tolerance: Other (comment)(did not tolerate assessment ) Patient left: in bed;with call bell/phone within reach;with bed alarm set Nurse Communication: Mobility status(nurse in room for part of assessment) PT Visit Diagnosis: Other abnormalities of gait and mobility (R26.89);Muscle weakness (generalized) (M62.81)    Time: 6144-3154 PT Time Calculation (min) (ACUTE ONLY): 23 min   Charges:   PT Evaluation $PT Eval Moderate Complexity: [place], PT   Delford Field 06/15/2019, 3:34 PM

## 2019-06-15 NOTE — Progress Notes (Signed)
MD notified about high BP and rectal bleed. No new orders received yet. Will continue to monitor.

## 2019-06-16 DIAGNOSIS — Z7189 Other specified counseling: Secondary | ICD-10-CM

## 2019-06-16 DIAGNOSIS — K922 Gastrointestinal hemorrhage, unspecified: Secondary | ICD-10-CM

## 2019-06-16 LAB — CBC WITH DIFFERENTIAL/PLATELET
Abs Immature Granulocytes: 0.17 10*3/uL — ABNORMAL HIGH (ref 0.00–0.07)
Basophils Absolute: 0 10*3/uL (ref 0.0–0.1)
Basophils Relative: 0 %
Eosinophils Absolute: 0 10*3/uL (ref 0.0–0.5)
Eosinophils Relative: 0 %
HCT: 30.2 % — ABNORMAL LOW (ref 39.0–52.0)
Hemoglobin: 9.1 g/dL — ABNORMAL LOW (ref 13.0–17.0)
Immature Granulocytes: 1 %
Lymphocytes Relative: 5 %
Lymphs Abs: 0.7 10*3/uL (ref 0.7–4.0)
MCH: 28.5 pg (ref 26.0–34.0)
MCHC: 30.1 g/dL (ref 30.0–36.0)
MCV: 94.7 fL (ref 80.0–100.0)
Monocytes Absolute: 0.3 10*3/uL (ref 0.1–1.0)
Monocytes Relative: 2 %
Neutro Abs: 13.3 10*3/uL — ABNORMAL HIGH (ref 1.7–7.7)
Neutrophils Relative %: 92 %
Platelets: 288 10*3/uL (ref 150–400)
RBC: 3.19 MIL/uL — ABNORMAL LOW (ref 4.22–5.81)
RDW: 17.1 % — ABNORMAL HIGH (ref 11.5–15.5)
WBC: 14.5 10*3/uL — ABNORMAL HIGH (ref 4.0–10.5)
nRBC: 0 % (ref 0.0–0.2)

## 2019-06-16 LAB — COMPREHENSIVE METABOLIC PANEL
ALT: 28 U/L (ref 0–44)
AST: 46 U/L — ABNORMAL HIGH (ref 15–41)
Albumin: 1.8 g/dL — ABNORMAL LOW (ref 3.5–5.0)
Alkaline Phosphatase: 87 U/L (ref 38–126)
BUN: 71 mg/dL — ABNORMAL HIGH (ref 8–23)
CO2: 16 mmol/L — ABNORMAL LOW (ref 22–32)
Calcium: 8.3 mg/dL — ABNORMAL LOW (ref 8.9–10.3)
Chloride: >130 mmol/L (ref 98–111)
Creatinine, Ser: 1.33 mg/dL — ABNORMAL HIGH (ref 0.61–1.24)
GFR calc Af Amer: 53 mL/min — ABNORMAL LOW (ref >60)
GFR calc non Af Amer: 46 mL/min — ABNORMAL LOW (ref >60)
Glucose, Bld: 189 mg/dL — ABNORMAL HIGH (ref 70–99)
Potassium: 3.8 mmol/L (ref 3.5–5.1)
Sodium: 160 mmol/L — ABNORMAL HIGH (ref 135–145)
Total Bilirubin: 0.4 mg/dL (ref 0.3–1.2)
Total Protein: 5.9 g/dL — ABNORMAL LOW (ref 6.5–8.1)

## 2019-06-16 LAB — GLUCOSE, CAPILLARY
Glucose-Capillary: 148 mg/dL — ABNORMAL HIGH (ref 70–99)
Glucose-Capillary: 148 mg/dL — ABNORMAL HIGH (ref 70–99)
Glucose-Capillary: 149 mg/dL — ABNORMAL HIGH (ref 70–99)
Glucose-Capillary: 158 mg/dL — ABNORMAL HIGH (ref 70–99)
Glucose-Capillary: 163 mg/dL — ABNORMAL HIGH (ref 70–99)
Glucose-Capillary: 182 mg/dL — ABNORMAL HIGH (ref 70–99)

## 2019-06-16 LAB — FERRITIN: Ferritin: 5229 ng/mL — ABNORMAL HIGH (ref 24–336)

## 2019-06-16 LAB — C-REACTIVE PROTEIN: CRP: 8.6 mg/dL — ABNORMAL HIGH (ref <1.0)

## 2019-06-16 LAB — D-DIMER, QUANTITATIVE: D-Dimer, Quant: 1.92 ug/mL-FEU — ABNORMAL HIGH (ref 0.00–0.50)

## 2019-06-16 MED ORDER — DEXTROSE 5 % IV BOLUS
500.0000 mL | Freq: Once | INTRAVENOUS | Status: AC
  Administered 2019-06-16: 500 mL via INTRAVENOUS

## 2019-06-16 MED ORDER — IPRATROPIUM-ALBUTEROL 20-100 MCG/ACT IN AERS: 1.0000 | INHALATION_SPRAY | Freq: Four times a day (QID) | RESPIRATORY_TRACT | Status: DC | PRN

## 2019-06-16 MED ORDER — DEXTROSE 5 % IV SOLN: INTRAVENOUS | Status: DC

## 2019-06-16 NOTE — Progress Notes (Addendum)
CRITICAL VALUE ALERT  Critical Value:  Chloride > 130  Date & Time Notied:  06/16/2019 07:50  Provider Notified: Dr. Joette Catching   Orders Received/Actions taken: notified Dr. Thereasa Solo via [facility] Main page with call back number provided and notified patient's primary RN, Laila.

## 2019-06-16 NOTE — Progress Notes (Signed)
OT Cancellation Note  Patient Details Name: Omar Gomez MRN: [id number] DOB: 1924/08/28   Cancelled Treatment:    Reason Eval/Treat Not Completed: OT screened, no needs identified, will sign off. Spoke with rep from [facility] who states [name] was total care, requiring assistance with all ADL and +2 Max A with pivot to wc. States he was having trouble feeding himself before he became ill and staff had started to help with feeding. No acute OT needs at this time. OT signing off.   Keysean Savino,HILLARY 06/16/2019, 11:13 AM  Maurie Boettcher, OT/L   Acute OT Clinical Specialist Acute Rehabilitation Services Pager [phone] Office [phone]

## 2019-06-16 NOTE — TOC Progression Note (Signed)
Transition of Care [facility]) - Progression Note    Patient Details  Name: Omar Gomez MRN: [id number] Date of Birth: 1924-06-14  [facility] of [facility]) CM/SW Contact  Shade Flood, LCSW Phone Number: 06/16/2019, 10:15 AM  Clinical Narrative:     TOC following. Spoke with Abigail Butts at [facility] today to update. Per Abigail Butts, pt will be able to return to the facility at dc. If pt stable for dc over the weekend, notify on call admissions at [phone] and they will assist.  TOC will follow.  Expected Discharge Plan: Skilled Nursing Facility Barriers to Discharge: Continued Medical Work up  Expected Discharge Plan and Services Expected Discharge Plan: [place]                                               Social Determinants of Health (SDOH) Interventions    Readmission Risk Interventions No flowsheet data found.

## 2019-06-16 NOTE — Progress Notes (Signed)
Omar Gomez  [id number] DOB: [birth year] DOA: 06/12/2019 PCP: Wenda Low, MD    Brief Narrative:  86 year old SNF resident with a history of dementia, AAA, DM, HTN, HLD, and prostate cancer who presented to the ED with progressive shortness of breath over many days.  He was known to be Covid positive from testing at his facility 1/7.  In the emergency room he was found to be saturating 92% on 2 L nasal cannula support.  CXR noted scattered patchy opacities in the right midlung.  Significant Events: 1/7 Covid test positive at [facility] 1/11 admit via [facility] ED 1/12 transfer to [facility]  COVID-19 specific Treatment: Decadron 1/11 > Remdesivir 1/11 > 1/15  Antimicrobials:  Cefepime 1/11 > 1/13 Rocephin 1/13 > Vancomycin 1/11 > 1/12  Subjective: No evidence of ongoing bleeding at this time. Na signif elevated today. BP erratic. Pt is awake but confused and unable to interact with me in an meaningful way.   Assessment & Plan:  Covid pneumonia -acute hypoxic respiratory failure Continue decadron - has completed a course of remdesivir - antibiotic being dosed due to concern for bacterial pneumonia - stopped vancomycin as MRSA screen negative  Recent Labs  Lab 06/12/19 1528 06/12/19 1530 06/13/19 0431 06/14/19 1100 06/15/19 0157 06/16/19 0515  DDIMER 2.94*  --  3.88* 3.35* 2.10* 1.92*  FERRITIN  --  3,103* 2,704* 3,918* 3,925* 5,229*  CRP  --  18.2* 19.9* 15.4* 10.6* 8.6*  ALT 35  --  33 32 27 28  PROCALCITON 1.01  --   --   --   --   --     Acute kidney injury likely prerenal azotemia - increase volume expansion today and follow   Recent Labs  Lab 06/12/19 1528 06/13/19 0431 06/14/19 1100 06/15/19 0157 06/16/19 0515  CREATININE 1.49* 1.34* 1.09 1.05 1.33*    Possible low-grade GI bleed Discontinued subcutaneous heparin for DVT prophylaxis - placed SCDs -Hgb relatively stable   Dehydration with lactic acidosis Continue judicious volume expansion as oral intake is  limited  E. coli UTI POA (cx collected 1/11) Continue directed antibiotic therapy w/ rocephin for 5 days of tx   Hypokalemia Corrected  Hypernatremia / Hyperchloremia  Due to essentially nil intake - change to D5W and follow   DM 2 CBG reasonably controlled at this time  HTN Gently adjust blood pressure treatment again and follow trend - choices limited by intermittent bradycardia   HLD  DVT prophylaxis: SCDs Code Status: FULL CODE Family Communication:  Disposition Plan: MedSurg bed  Consultants:  Palliative Care   Objective: Blood pressure (!) 149/109, pulse (!) 57, temperature (!) 97.2 F (36.2 C), temperature source Axillary, resp. rate 15, height 5\' 7"  (1.702 m), weight 71 kg, SpO2 100 %.  Intake/Output Summary (Last 24 hours) at 06/16/2019 1554 Last data filed at 06/16/2019 1400 Gross per 24 hour  Intake 703 ml  Output 975 ml  Net -272 ml   Filed Weights   06/12/19 2001  Weight: 71 kg    Examination: General: No acute respiratory distress - confused  Lungs: Fine crackles scattered diffusely  Cardiovascular: RRR - no rub  Abdomen: soft, bs+, no mass, non-distended  Extremities: No edema B LE   CBC: Recent Labs  Lab 06/14/19 1100 06/14/19 1100 06/15/19 0157 06/15/19 1200 06/16/19 0515  WBC 13.0*   < > 15.7* 16.6* 14.5*  NEUTROABS 11.5*  --  13.9*  --  13.3*  HGB 11.1*   < > 9.7* 10.0* 9.1*  HCT 40.2   < > 31.4* 32.7* 30.2*  MCV 103.6*   < > 94.0 93.4 94.7  PLT 244   < > 314 326 288   < > = values in this interval not displayed.   Basic Metabolic Panel: Recent Labs  Lab 06/14/19 1100 06/15/19 0157 06/16/19 0515  NA 155* 155* 160*  K 3.4* 2.6* 3.8  CL 127* 129* >130*  CO2 16* 16* 16*  GLUCOSE 145* 123* 189*  BUN 61* 61* 71*  CREATININE 1.09 1.05 1.33*  CALCIUM 8.5* 7.5* 8.3*  MG  --  1.9  --    GFR: Estimated Creatinine Clearance: 33.1 mL/min (A) (by C-G formula based on SCr of 1.33 mg/dL (H)).  Liver Function Tests: Recent Labs   Lab 06/13/19 0431 06/14/19 1100 06/15/19 0157 06/16/19 0515  AST 40 48* 43* 46*  ALT 33 32 27 28  ALKPHOS 85 85 78 87  BILITOT 0.6 0.7 0.5 0.4  PROT 6.8 6.5 5.5* 5.9*  ALBUMIN 2.1* 2.0* 1.7* 1.8*    HbA1C: Hgb A1c MFr Bld  Date/Time Value Ref Range Status  06/12/2019 05:22 PM 6.8 (H) 4.8 - 5.6 % Final    Comment:    (NOTE) Pre diabetes:          5.7%-6.4% Diabetes:              >6.4% Glycemic control for   <7.0% adults with diabetes   03/22/2019 10:04 PM 7.2 (H) 4.8 - 5.6 % Final    Comment:    (NOTE) Pre diabetes:          5.7%-6.4% Diabetes:              >6.4% Glycemic control for   <7.0% adults with diabetes     CBG: Recent Labs  Lab 06/15/19 1943 06/15/19 2354 06/16/19 0413 06/16/19 0718 06/16/19 1122  GLUCAP 131* 148* 163* 149* 158*    Recent Results (from the past 240 hour(s))  Blood Culture (routine x 2)     Status: None (Preliminary result)   Collection Time: 06/12/19  2:46 PM   Specimen: BLOOD  Result Value Ref Range Status   Specimen Description   Final    BLOOD RIGHT ANTECUBITAL Performed at [facility], [address] [place], [place] [address]    Special Requests   Final    BOTTLES DRAWN AEROBIC AND ANAEROBIC Blood Culture results may not be optimal due to an inadequate volume of blood received in culture bottles Performed at [place] [address], [place], [place] [address]    Culture   Final    NO GROWTH 3 DAYS Performed at [facility], [place] [address] [place], [place] [address]    Report Status PENDING  Incomplete  Blood Culture (routine x 2)     Status: None (Preliminary result)   Collection Time: 06/12/19  3:30 PM   Specimen: Left Antecubital; Blood  Result Value Ref Range Status   Specimen Description   Final    LEFT ANTECUBITAL Performed at [place] [address], [place], [place] [address]    Special Requests   Final    BOTTLES DRAWN AEROBIC AND ANAEROBIC  Blood Culture adequate volume Performed at [place] [address] [place], [place] [address]    Culture   Final    NO GROWTH 3 DAYS Performed at [facility], [place] [address], [place], [place] [address]  Report Status PENDING  Incomplete  Urine Culture     Status: Abnormal   Collection Time: 06/12/19  4:56 PM   Specimen: Urine, Random  Result Value Ref Range Status   Specimen Description   Final    URINE, RANDOM Performed at [place] [address], [place], [place] [address]    Special Requests   Final    NONE Performed at [facility], [place] [address] [place], [place] [address]    Culture >=100,000 COLONIES/mL ESCHERICHIA COLI (A)  Final   Report Status 06/15/2019 FINAL  Final   Organism ID, Bacteria ESCHERICHIA COLI (A)  Final      Susceptibility   Escherichia coli - MIC*    AMPICILLIN 16 INTERMEDIATE Intermediate     CEFAZOLIN <=4 SENSITIVE Sensitive     CEFTRIAXONE <=0.25 SENSITIVE Sensitive     CIPROFLOXACIN <=0.25 SENSITIVE Sensitive     GENTAMICIN <=1 SENSITIVE Sensitive     IMIPENEM <=0.25 SENSITIVE Sensitive     NITROFURANTOIN <=16 SENSITIVE Sensitive     TRIMETH/SULFA <=20 SENSITIVE Sensitive     AMPICILLIN/SULBACTAM 4 SENSITIVE Sensitive     PIP/TAZO <=4 SENSITIVE Sensitive     * >=100,000 COLONIES/mL ESCHERICHIA COLI  MRSA PCR Screening     Status: None   Collection Time: 06/12/19  8:13 PM   Specimen: Nasopharyngeal  Result Value Ref Range Status   MRSA by PCR NEGATIVE NEGATIVE Final    Comment:        The GeneXpert MRSA Assay (FDA approved for NASAL specimens only), is one component of a comprehensive MRSA colonization surveillance program. It is not intended to diagnose MRSA infection nor to guide or monitor treatment for MRSA infections. Performed at [facility], [place] [address], [place], [place] [address]      Scheduled Meds: . amLODipine  10 mg Oral  Daily  . vitamin C  500 mg Oral Daily  . Chlorhexidine Gluconate Cloth  6 each Topical Daily  . dexamethasone (DECADRON) injection  6 mg Intravenous Q24H  . feeding supplement  1 Container Oral TID BM  . insulin aspart  0-9 Units Subcutaneous Q4H  . Ipratropium-Albuterol  1 puff Inhalation Q6H  . mouth rinse  15 mL Mouth Rinse BID  . sodium chloride flush  3 mL Intravenous Q12H  . zinc sulfate  220 mg Oral Daily   Continuous Infusions: . cefTRIAXone (ROCEPHIN)  IV 1 g (06/15/19 2100)  . dextrose 75 mL/hr at 06/16/19 1112     LOS: 4 days   Cherene Altes, MD [facility] Office  [phone] Pager - Text Page per Amion  If 7PM-7AM, please contact night-coverage per Amion 06/16/2019, 3:54 PM

## 2019-06-16 NOTE — Progress Notes (Signed)
Pt stable with no signs of distress. Report given to oncoming nurse. Safety maintained.  

## 2019-06-16 NOTE — Progress Notes (Signed)
   Palliative Medicine Inpatient Follow Up Note   HPI: 86 y.o. male  with past medical history of dementia, AAA, DM, prostate cancer, HTN, HLD admitted on 06/12/2019 from SNF covid + with hypoxia and hypotension. Recent hospitalization 03/2019 due to failure to thrive and frequent falls discharged to SNF. Hospital admission for acute respiratory failure secondary to covid-19 with chest xray concerning for right midlung pneumonia, AKI/lactic acidosis, dehydration. Receiving IVF, antibiotic, steroid, and remdesivir. Palliative medicine consultation for goals of care.   Today's Discussion (06/16/2019): Chart reviewed. Patients nurse, Unk Lightning states that the patient is not doing much better today than the previous day. Appears that oxygen needs have decreased as compared to yesterday, on 3LPM  He still have extremely poor PO intake and remains confused.  Spoke to patients wife, Omar Gomez who expressed anger at [employer] stating that she feels he contracted this virus through their facility. We discussed the different transmissions of COVID-19. Provided therapeutic listening for Omar Gomez as she truly needed to express herself. She is having a hard time coming to terms with the patients present clinical condition. When patient went to [place] he "did not have problem" per [place]. She remains hopeful for improvement, but realistic that this may not happen.  Discussed  the importance of continued conversation with family and their  medical providers regarding overall plan of care and treatment options, ensuring decisions are within the context of the patients values and GOCs.  Recommendations and Plan: - Living will routed to media section of chart, placed in medical records binder for scanning into [facility] - DNR/DNI and no feeding tube.  - Continue medical management and current plan of care. Watchful waiting.   Thank you for allowing the Palliative Medicine Team to assist in the care of this  patient.  The above conversation was completed via telephone due to visitor restrictions during COVID-19 pandemic. Thorough chart review and discussion with multidisciplinary team was completed as part of assessment. No physical examination was performed.   Time In: 1330 Time Out: 1400 Time Spent: 30 Greater than 50% of the time was spent in counseling and coordination of care ______________________________________________________________________________________ [place] Team Team Cell Phone: [phone] Please utilize secure chat with additional questions, if there is no response within 30 minutes please call the above phone number  Palliative Medicine Team providers are available by phone from 7am to 7pm daily and can be reached through the team cell phone.  Should this patient require assistance outside of these hours, please call the patient's attending physician.

## 2019-06-17 LAB — RENAL FUNCTION PANEL
Albumin: 1.8 g/dL — ABNORMAL LOW (ref 3.5–5.0)
BUN: 66 mg/dL — ABNORMAL HIGH (ref 8–23)
CO2: 16 mmol/L — ABNORMAL LOW (ref 22–32)
Calcium: 8.3 mg/dL — ABNORMAL LOW (ref 8.9–10.3)
Chloride: >130 mmol/L (ref 98–111)
Creatinine, Ser: 1.35 mg/dL — ABNORMAL HIGH (ref 0.61–1.24)
GFR calc Af Amer: 52 mL/min — ABNORMAL LOW (ref >60)
GFR calc non Af Amer: 45 mL/min — ABNORMAL LOW (ref >60)
Glucose, Bld: 180 mg/dL — ABNORMAL HIGH (ref 70–99)
Phosphorus: 2.8 mg/dL (ref 2.5–4.6)
Potassium: 3.5 mmol/L (ref 3.5–5.1)
Sodium: 157 mmol/L — ABNORMAL HIGH (ref 135–145)

## 2019-06-17 LAB — GLUCOSE, CAPILLARY
Glucose-Capillary: 137 mg/dL — ABNORMAL HIGH (ref 70–99)
Glucose-Capillary: 138 mg/dL — ABNORMAL HIGH (ref 70–99)
Glucose-Capillary: 154 mg/dL — ABNORMAL HIGH (ref 70–99)
Glucose-Capillary: 163 mg/dL — ABNORMAL HIGH (ref 70–99)
Glucose-Capillary: 166 mg/dL — ABNORMAL HIGH (ref 70–99)
Glucose-Capillary: 185 mg/dL — ABNORMAL HIGH (ref 70–99)

## 2019-06-17 LAB — CBC
HCT: 30.7 % — ABNORMAL LOW (ref 39.0–52.0)
Hemoglobin: 9.2 g/dL — ABNORMAL LOW (ref 13.0–17.0)
MCH: 28.3 pg (ref 26.0–34.0)
MCHC: 30 g/dL (ref 30.0–36.0)
MCV: 94.5 fL (ref 80.0–100.0)
Platelets: 274 10*3/uL (ref 150–400)
RBC: 3.25 MIL/uL — ABNORMAL LOW (ref 4.22–5.81)
RDW: 17.4 % — ABNORMAL HIGH (ref 11.5–15.5)
WBC: 14 10*3/uL — ABNORMAL HIGH (ref 4.0–10.5)
nRBC: 0 % (ref 0.0–0.2)

## 2019-06-17 LAB — CULTURE, BLOOD (ROUTINE X 2)
Culture: NO GROWTH
Culture: NO GROWTH
Special Requests: ADEQUATE

## 2019-06-17 MED ORDER — ACETAMINOPHEN 650 MG RE SUPP: 650.0000 mg | RECTAL | Status: DC | PRN

## 2019-06-17 NOTE — Progress Notes (Signed)
CRITICAL VALUE ALERT  Critical Value:  Chloride >130  Date & Time Notied:  06/17/2019 10:21  Provider Notified: Dr. Joette Catching  Orders Received/Actions taken: MD notified. No new orders

## 2019-06-17 NOTE — Progress Notes (Signed)
   Palliative Medicine Inpatient Follow Up Note HPI: 86 y.o.malewith past medical history of dementia, AAA, DM, prostate cancer, HTN, HLDadmitted on 1/11/2021from SNF covid + with hypoxia and hypotension.Recent hospitalization 03/2019 due to failure to thrive and frequent falls discharged to SNF. Hospital admission for acute respiratory failure secondary to covid-19 with chest xray concerning for right midlung pneumonia, AKI/lactic acidosis, dehydration. Receiving IVF, antibiotic, steroid, and remdesivir. Palliative medicine consultation for goals of care.  Today's Discussion (06/17/2019): Chart reviewed. Touched base with patients bedside RN, Omar Gomez who said that patient was moaning, remains to not eat. Able to speak to primary team who say similarly that Omar Gomez is not making improvements.   Called patients wife, Omar Gomez and discussed these ideas further. She seemed surprised that he was in such poor health. The longer we talked it seemed the more realistic she started to become stating that she had seen him on factime and "she doesn't wish to remember him like that." She asked what she should do. I told her this is an important conversation to have with her daughters and I'd be happy to facilitate that if need be. He said that she would like to speak with them first.  I later spoke to Omar Gomez (daughter) who asked for clarification on her fathers clinical state. We reviewed how he has done since being admitted to the hospital and discussed his continue failure to thrive picture. I explained the differences between palliative care and hospice for better clarity. We then spoke about comfort measures and if the family universally decides to pursue those what that would look like in the hospital setting.  [place] had asked to take some time to speak with the rest of their family. We ended by agreeing to communicate in the morning to make further decisions.   Discussed  the importance of  continued conversation with family and their medical providers regarding overall plan of care and treatment options, ensuring decisions are within the context of the patients values and GOCs.  Questions and concerns addressed   Recommendations and Plan:  - Continue medical management  - In the morning plan to speak with Omar Gomez and Omar Gomez again to discuss the possibility of transition to comfort measures - Living will routed to media section of chart, placed in medical records binder for scanning into [facility] - DNR/DNI and no feeding tube.   Thank you for allowing the Palliative Medicine Team to assist in the care of this patient.  The above conversation was completed via telephone due to visitor restrictions during COVID-19 pandemic. Thorough chart review and discussion with multidisciplinary team was completed as part of assessment. No physical examination was performed.  Time Spent: 25 Greater than 50% of the time was spent in counseling and coordination of care ______________________________________________________________________________________ Omar Gomez Team Team Cell Phone: [phone] Please utilize secure chat with additional questions, if there is no response within 30 minutes please call the above phone number  Palliative Medicine Team providers are available by phone from 7am to 7pm daily and can be reached through the team cell phone.  Should this patient require assistance outside of these hours, please call the patient's attending physician.

## 2019-06-17 NOTE — Progress Notes (Signed)
Omar Gomez  [id number] DOB: [birth year] DOA: 06/12/2019 PCP: Wenda Low, MD    Brief Narrative:  86 year old SNF resident with a history of dementia, AAA, DM, HTN, HLD, and prostate cancer who presented to the ED with progressive shortness of breath over many days.  He was known to be Covid positive from testing at his facility 1/7.  In the emergency room he was found to be saturating 92% on 2 L nasal cannula support.  CXR noted scattered patchy opacities in the right midlung.  Significant Events: 1/7 Covid test positive at [facility] 1/11 admit via [facility] 1/12 transfer to [facility]  COVID-19 specific Treatment: Decadron 1/11 > 1/16 Remdesivir 1/11 > 1/15  Antimicrobials:  Cefepime 1/11 > 1/13 Rocephin 1/13 > 1/15 Vancomycin 1/11 > 1/12  Subjective: The patient is noncommunicative.  He is awake but does not turn to the examiner or follow simple commands.  He does not necessarily appear to be uncomfortable.  Assessment & Plan:  Covid pneumonia -acute hypoxic respiratory failure Discontinue decadron out of fear that it may be worsening his delirium - has completed a course of remdesivir - antibiotic was dosed due to concern for bacterial pneumonia - stopped vancomycin as MRSA screen negative  Recent Labs  Lab 06/12/19 1528 06/12/19 1530 06/13/19 0431 06/14/19 1100 06/15/19 0157 06/16/19 0515  DDIMER 2.94*  --  3.88* 3.35* 2.10* 1.92*  FERRITIN  --  3,103* 2,704* 3,918* 3,925* 5,229*  CRP  --  18.2* 19.9* 15.4* 10.6* 8.6*  ALT 35  --  33 32 27 28  PROCALCITON 1.01  --   --   --   --   --     Acute kidney injury likely prerenal azotemia - crt holding steady w/ volume expansion   Recent Labs  Lab 06/13/19 0431 06/14/19 1100 06/15/19 0157 06/16/19 0515 06/17/19 0755  CREATININE 1.34* 1.09 1.05 1.33* 1.35*    Possible low-grade GI bleed Discontinued subcutaneous heparin for DVT prophylaxis - placed SCDs - Hgb stable   Dehydration with lactic acidosis Continue  judicious volume expansion as oral intake is nil   E. coli UTI POA (cx collected 1/11) Completed antibiotic therapy w/ rocephin for 5 days of tx   Hypokalemia Corrected  Hypernatremia / Hyperchloremia  Due to nil intake - cont IV D5W and follow   DM 2 CBG reasonably controlled at this time  HTN Follow w/o change in tx for now   Disposition  Will continue free water via IV for now -if the patient's mental state does not improve over the next day or 2 I feel transitioning to comfort care for what will been being most consistent with terminal dementia would be most appropriate  DVT prophylaxis: SCDs Code Status: DNR - NO CODE Family Communication:  Disposition Plan: MedSurg bed  Consultants:  Palliative Care   Objective: Blood pressure (!) 108/54, pulse (!) 58, temperature 97.8 F (36.6 C), temperature source Axillary, resp. rate 18, height 5\' 7"  (1.702 m), weight 71 kg, SpO2 97 %.  Intake/Output Summary (Last 24 hours) at 06/17/2019 1639 Last data filed at 06/17/2019 1553 Gross per 24 hour  Intake 510.14 ml  Output 840 ml  Net -329.86 ml   Filed Weights   06/12/19 2001  Weight: 71 kg    Examination: General: No acute respiratory distress Lungs: Fine crackles scattered diffusely -no wheezing Cardiovascular: RRR Abdomen: soft, bs+, no mass, non-distended  Extremities: No edema B LE   CBC: Recent Labs  Lab 06/14/19 1100  06/14/19 1100 06/15/19 0157 06/15/19 0157 06/15/19 1200 06/16/19 0515 06/17/19 0755  WBC 13.0*   < > 15.7*   < > 16.6* 14.5* 14.0*  NEUTROABS 11.5*  --  13.9*  --   --  13.3*  --   HGB 11.1*   < > 9.7*   < > 10.0* 9.1* 9.2*  HCT 40.2   < > 31.4*   < > 32.7* 30.2* 30.7*  MCV 103.6*   < > 94.0   < > 93.4 94.7 94.5  PLT 244   < > 314   < > 326 288 274   < > = values in this interval not displayed.   Basic Metabolic Panel: Recent Labs  Lab 06/15/19 0157 06/16/19 0515 06/17/19 0755  NA 155* 160* 157*  K 2.6* 3.8 3.5  CL 129* >130* >130*   CO2 16* 16* 16*  GLUCOSE 123* 189* 180*  BUN 61* 71* 66*  CREATININE 1.05 1.33* 1.35*  CALCIUM 7.5* 8.3* 8.3*  MG 1.9  --   --   PHOS  --   --  2.8   GFR: Estimated Creatinine Clearance: 32.6 mL/min (A) (by C-G formula based on SCr of 1.35 mg/dL (H)).  Liver Function Tests: Recent Labs  Lab 06/13/19 0431 06/13/19 0431 06/14/19 1100 06/15/19 0157 06/16/19 0515 06/17/19 0755  AST 40  --  48* 43* 46*  --   ALT 33  --  32 27 28  --   ALKPHOS 85  --  85 78 87  --   BILITOT 0.6  --  0.7 0.5 0.4  --   PROT 6.8  --  6.5 5.5* 5.9*  --   ALBUMIN 2.1*   < > 2.0* 1.7* 1.8* 1.8*   < > = values in this interval not displayed.    HbA1C: Hgb A1c MFr Bld  Date/Time Value Ref Range Status  06/12/2019 05:22 PM 6.8 (H) 4.8 - 5.6 % Final    Comment:    (NOTE) Pre diabetes:          5.7%-6.4% Diabetes:              >6.4% Glycemic control for   <7.0% adults with diabetes   03/22/2019 10:04 PM 7.2 (H) 4.8 - 5.6 % Final    Comment:    (NOTE) Pre diabetes:          5.7%-6.4% Diabetes:              >6.4% Glycemic control for   <7.0% adults with diabetes     CBG: Recent Labs  Lab 06/16/19 2033 06/17/19 0012 06/17/19 0420 06/17/19 0743 06/17/19 1232  GLUCAP 148* 166* 154* 163* 138*    Recent Results (from the past 240 hour(s))  Blood Culture (routine x 2)     Status: None   Collection Time: 06/12/19  2:46 PM   Specimen: BLOOD  Result Value Ref Range Status   Specimen Description   Final    BLOOD RIGHT ANTECUBITAL Performed at [facility], [place] [address], [place], [place] [address]    Special Requests   Final    BOTTLES DRAWN AEROBIC AND ANAEROBIC Blood Culture results may not be optimal due to an inadequate volume of blood received in culture bottles Performed at [place] [address], [place], [place] [address]    Culture   Final    NO GROWTH 5 DAYS Performed at [facility], [place] [address] [place], [place]  [id number]     Report Status 06/17/2019 FINAL  Final  Blood Culture (routine x 2)     Status: None   Collection Time: 06/12/19  3:30 PM   Specimen: Left Antecubital; Blood  Result Value Ref Range Status   Specimen Description   Final    LEFT ANTECUBITAL Performed at [facility] [address] [place], [place] [address]    Special Requests   Final    BOTTLES DRAWN AEROBIC AND ANAEROBIC Blood Culture adequate volume Performed at [place] [address], [place], [place] [address]    Culture   Final    NO GROWTH 5 DAYS Performed at [facility], [place] [address], [place], [place] [address]    Report Status 06/17/2019 FINAL  Final  Urine Culture     Status: Abnormal   Collection Time: 06/12/19  4:56 PM   Specimen: Urine, Random  Result Value Ref Range Status   Specimen Description   Final    URINE, RANDOM Performed at [facility] [address] [place], [place] [address]    Special Requests   Final    NONE Performed at [facility], [place] [address] [place], [place] [address]    Culture >=100,000 COLONIES/mL ESCHERICHIA COLI (A)  Final   Report Status 06/15/2019 FINAL  Final   Organism ID, Bacteria ESCHERICHIA COLI (A)  Final      Susceptibility   Escherichia coli - MIC*    AMPICILLIN 16 INTERMEDIATE Intermediate     CEFAZOLIN <=4 SENSITIVE Sensitive     CEFTRIAXONE <=0.25 SENSITIVE Sensitive     CIPROFLOXACIN <=0.25 SENSITIVE Sensitive     GENTAMICIN <=1 SENSITIVE Sensitive     IMIPENEM <=0.25 SENSITIVE Sensitive     NITROFURANTOIN <=16 SENSITIVE Sensitive     TRIMETH/SULFA <=20 SENSITIVE Sensitive     AMPICILLIN/SULBACTAM 4 SENSITIVE Sensitive     PIP/TAZO <=4 SENSITIVE Sensitive     * >=100,000 COLONIES/mL ESCHERICHIA COLI  MRSA PCR Screening     Status: None   Collection Time: 06/12/19  8:13 PM   Specimen: Nasopharyngeal  Result Value Ref Range Status   MRSA by PCR NEGATIVE NEGATIVE Final     Comment:        The GeneXpert MRSA Assay (FDA approved for NASAL specimens only), is one component of a comprehensive MRSA colonization surveillance program. It is not intended to diagnose MRSA infection nor to guide or monitor treatment for MRSA infections. Performed at [facility], [place] [address] [place], [place] [address]      Scheduled Meds: . amLODipine  10 mg Oral Daily  . vitamin C  500 mg Oral Daily  . Chlorhexidine Gluconate Cloth  6 each Topical Daily  . feeding supplement  1 Container Oral TID BM  . insulin aspart  0-9 Units Subcutaneous Q4H  . mouth rinse  15 mL Mouth Rinse BID  . sodium chloride flush  3 mL Intravenous Q12H   Continuous Infusions: . dextrose 75 mL/hr at 06/17/19 1553     LOS: 5 days   Cherene Altes, MD [facility] Office  [phone] Pager - Text Page per Shea Evans  If 7PM-7AM, please contact night-coverage per Amion 06/17/2019, 4:39 PM

## 2019-06-17 NOTE — Progress Notes (Signed)
Family FaceTimed with patient (granddaughters/spouse). Patients family updated and provided with RNs number to call if any questions.

## 2019-06-18 DIAGNOSIS — R4589 Other symptoms and signs involving emotional state: Secondary | ICD-10-CM

## 2019-06-18 DIAGNOSIS — Z515 Encounter for palliative care: Secondary | ICD-10-CM

## 2019-06-18 DIAGNOSIS — Z9189 Other specified personal risk factors, not elsewhere classified: Secondary | ICD-10-CM

## 2019-06-18 LAB — BASIC METABOLIC PANEL
BUN: 57 mg/dL — ABNORMAL HIGH (ref 8–23)
CO2: 15 mmol/L — ABNORMAL LOW (ref 22–32)
Calcium: 8.2 mg/dL — ABNORMAL LOW (ref 8.9–10.3)
Chloride: >130 mmol/L (ref 98–111)
Creatinine, Ser: 1.22 mg/dL (ref 0.61–1.24)
GFR calc Af Amer: 59 mL/min — ABNORMAL LOW (ref >60)
GFR calc non Af Amer: 51 mL/min — ABNORMAL LOW (ref >60)
Glucose, Bld: 98 mg/dL (ref 70–99)
Potassium: 3.4 mmol/L — ABNORMAL LOW (ref 3.5–5.1)
Sodium: 156 mmol/L — ABNORMAL HIGH (ref 135–145)

## 2019-06-18 LAB — GLUCOSE, CAPILLARY
Glucose-Capillary: 151 mg/dL — ABNORMAL HIGH (ref 70–99)
Glucose-Capillary: 168 mg/dL — ABNORMAL HIGH (ref 70–99)
Glucose-Capillary: 198 mg/dL — ABNORMAL HIGH (ref 70–99)

## 2019-06-18 MED ORDER — HALOPERIDOL LACTATE 2 MG/ML PO CONC
0.5000 mg | ORAL | Status: DC | PRN
  Filled 2019-06-18: qty 0.3

## 2019-06-18 MED ORDER — MORPHINE SULFATE (PF) 2 MG/ML IV SOLN: 1.0000 mg | INTRAVENOUS | Status: DC | PRN

## 2019-06-18 MED ORDER — SODIUM CHLORIDE 0.9% FLUSH: 3.0000 mL | INTRAVENOUS | Status: DC | PRN

## 2019-06-18 MED ORDER — MORPHINE SULFATE (CONCENTRATE) 10 MG/0.5ML PO SOLN: 5.0000 mg | ORAL | Status: DC | PRN

## 2019-06-18 MED ORDER — SODIUM CHLORIDE 0.9 % IV SOLN: 250.0000 mL | INTRAVENOUS | Status: DC | PRN

## 2019-06-18 MED ORDER — LORAZEPAM 2 MG/ML PO CONC: 1.0000 mg | ORAL | Status: DC | PRN

## 2019-06-18 MED ORDER — GLYCOPYRROLATE 1 MG PO TABS
1.0000 mg | ORAL_TABLET | ORAL | Status: DC | PRN
  Filled 2019-06-18: qty 1

## 2019-06-18 MED ORDER — LORAZEPAM 1 MG PO TABS: 1.0000 mg | ORAL_TABLET | ORAL | Status: DC | PRN

## 2019-06-18 MED ORDER — GLYCOPYRROLATE 0.2 MG/ML IJ SOLN
0.4000 mg | INTRAMUSCULAR | Status: DC
  Administered 2019-06-18 – 2019-06-22 (×22): 0.4 mg via INTRAVENOUS
  Filled 2019-06-18 (×22): qty 2

## 2019-06-18 MED ORDER — LORAZEPAM 2 MG/ML PO CONC
1.0000 mg | ORAL | Status: DC | PRN
  Filled 2019-06-18: qty 0.5

## 2019-06-18 MED ORDER — HALOPERIDOL 0.5 MG PO TABS
0.5000 mg | ORAL_TABLET | ORAL | Status: DC | PRN
  Filled 2019-06-18: qty 1

## 2019-06-18 MED ORDER — ACETAMINOPHEN 650 MG RE SUPP: 650.0000 mg | Freq: Four times a day (QID) | RECTAL | Status: DC | PRN

## 2019-06-18 MED ORDER — SODIUM CHLORIDE 0.9% FLUSH
3.0000 mL | Freq: Two times a day (BID) | INTRAVENOUS | Status: DC
  Administered 2019-06-18: 3 mL via INTRAVENOUS

## 2019-06-18 MED ORDER — BISACODYL 10 MG RE SUPP: 10.0000 mg | Freq: Every day | RECTAL | Status: DC | PRN

## 2019-06-18 MED ORDER — ONDANSETRON HCL 4 MG/2ML IJ SOLN: 4.0000 mg | Freq: Four times a day (QID) | INTRAMUSCULAR | Status: DC | PRN

## 2019-06-18 MED ORDER — LORAZEPAM 2 MG/ML IJ SOLN
1.0000 mg | INTRAMUSCULAR | Status: DC | PRN
  Administered 2019-06-18: 1 mg via INTRAVENOUS
  Filled 2019-06-18: qty 1

## 2019-06-18 MED ORDER — GLYCOPYRROLATE 0.2 MG/ML IJ SOLN: 0.2000 mg | INTRAMUSCULAR | Status: DC | PRN

## 2019-06-18 MED ORDER — LORAZEPAM 0.5 MG PO TABS: 1.0000 mg | ORAL_TABLET | ORAL | Status: DC | PRN

## 2019-06-18 MED ORDER — ACETAMINOPHEN 325 MG PO TABS: 650.0000 mg | ORAL_TABLET | Freq: Four times a day (QID) | ORAL | Status: DC | PRN

## 2019-06-18 MED ORDER — HALOPERIDOL LACTATE 5 MG/ML IJ SOLN: 0.5000 mg | INTRAMUSCULAR | Status: DC | PRN

## 2019-06-18 MED ORDER — ARTIFICIAL TEARS OPHTHALMIC OINT
TOPICAL_OINTMENT | OPHTHALMIC | Status: DC | PRN
  Filled 2019-06-18: qty 3.5

## 2019-06-18 MED ORDER — ONDANSETRON 4 MG PO TBDP: 4.0000 mg | ORAL_TABLET | Freq: Four times a day (QID) | ORAL | Status: DC | PRN

## 2019-06-18 MED ORDER — LORAZEPAM 2 MG/ML IJ SOLN: 1.0000 mg | INTRAMUSCULAR | Status: DC | PRN

## 2019-06-18 MED ORDER — BIOTENE DRY MOUTH MT LIQD
15.0000 mL | OROMUCOSAL | Status: DC | PRN
  Administered 2019-06-20: 15 mL via TOPICAL

## 2019-06-18 NOTE — Progress Notes (Signed)
Omar Gomez  [id number] DOB: 08/14/1924 DOA: 06/12/2019 PCP: Wenda Low, MD    Brief Narrative:  86 year old SNF resident with a history of dementia, AAA, DM, HTN, HLD, and prostate cancer who presented to the ED with progressive shortness of breath over many days.  He was known to be Covid positive from testing at his facility 1/7.  In the emergency room he was found to be saturating 92% on 2 L nasal cannula support.  CXR noted scattered patchy opacities in the right midlung.  Significant Events: 1/7 Covid test positive at [facility] 1/11 admit via [facility] ED 1/12 transfer to [facility]  COVID-19 specific Treatment: Decadron 1/11 > 1/16 Remdesivir 1/11 > 1/15  Antimicrobials:  Cefepime 1/11 > 1/13 Rocephin 1/13 > 1/15 Vancomycin 1/11 > 1/12  Subjective: The patient remains confused and unable to interact with the examiner.  He does not appear to be uncomfortable.  Palliative care has been working with the family and the decision has now been made to transition to a comfort focused approach.  I fully agree with this decision.  Assessment & Plan:  Covid pneumonia -acute hypoxic respiratory failure Discontinued decadron out of fear that it was worsening his delirium - completed a course of remdesivir - antibiotic was dosed due to concern for bacterial pneumonia - stopped vancomycin as MRSA screen negative  Acute kidney injury likely prerenal azotemia - crt improved w/ volume expansion   Possible low-grade GI bleed Discontinued subcutaneous heparin for DVT prophylaxis - Hgb stable   Dehydration with lactic acidosis  E. coli UTI POA (cx collected 1/11) Completed antibiotic therapy w/ rocephin for 5 days of tx   Hypokalemia Corrected  Hypernatremia / Hyperchloremia  Due to nil intake  DM 2 CBG reasonably controlled at this time  HTN Follow w/o change in tx for now   Disposition  Transition to comfort focused care -TOC is presently attempting to locate a bed within a  residential hospice facility  DVT prophylaxis: SCDs Code Status: DNR - NO CODE Family Communication:  Disposition Plan: MedSurg bed  Consultants:  Palliative Care   Objective: Blood pressure (!) 103/45, pulse (!) 50, temperature 98.2 F (36.8 C), temperature source Oral, resp. rate 16, height 5\' 7"  (1.702 m), weight 71 kg, SpO2 94 %.  Intake/Output Summary (Last 24 hours) at 06/18/2019 0920 Last data filed at 06/18/2019 0600 Gross per 24 hour  Intake 510.14 ml  Output 940 ml  Net -429.86 ml   Filed Weights   06/12/19 2001  Weight: 71 kg    Examination: General: No acute respiratory distress Lungs: Fine crackles scattered diffusely  Cardiovascular: RRR Abdomen: soft, bs+, no mass, NT/ND Extremities: No edema B LE   CBC: Recent Labs  Lab 06/14/19 1100 06/14/19 1100 06/15/19 0157 06/15/19 0157 06/15/19 1200 06/16/19 0515 06/17/19 0755  WBC 13.0*   < > 15.7*   < > 16.6* 14.5* 14.0*  NEUTROABS 11.5*  --  13.9*  --   --  13.3*  --   HGB 11.1*   < > 9.7*   < > 10.0* 9.1* 9.2*  HCT 40.2   < > 31.4*   < > 32.7* 30.2* 30.7*  MCV 103.6*   < > 94.0   < > 93.4 94.7 94.5  PLT 244   < > 314   < > 326 288 274   < > = values in this interval not displayed.   Basic Metabolic Panel: Recent Labs  Lab 06/15/19 0157 06/15/19 0157 06/16/19  [id number] 06/17/19 0755 06/18/19 0336  NA 155*   < > 160* 157* 156*  K 2.6*   < > 3.8 3.5 3.4*  CL 129*   < > >130* >130* >130*  CO2 16*   < > 16* 16* 15*  GLUCOSE 123*   < > 189* 180* 98  BUN 61*   < > 71* 66* 57*  CREATININE 1.05   < > 1.33* 1.35* 1.22  CALCIUM 7.5*   < > 8.3* 8.3* 8.2*  MG 1.9  --   --   --   --   PHOS  --   --   --  2.8  --    < > = values in this interval not displayed.   GFR: Estimated Creatinine Clearance: 36.1 mL/min (by C-G formula based on SCr of 1.22 mg/dL).  Liver Function Tests: Recent Labs  Lab 06/13/19 0431 06/13/19 0431 06/14/19 1100 06/15/19 0157 06/16/19 0515 06/17/19 0755  AST 40  --  48* 43*  46*  --   ALT 33  --  32 27 28  --   ALKPHOS 85  --  85 78 87  --   BILITOT 0.6  --  0.7 0.5 0.4  --   PROT 6.8  --  6.5 5.5* 5.9*  --   ALBUMIN 2.1*   < > 2.0* 1.7* 1.8* 1.8*   < > = values in this interval not displayed.    HbA1C: Hgb A1c MFr Bld  Date/Time Value Ref Range Status  06/12/2019 05:22 PM 6.8 (H) 4.8 - 5.6 % Final    Comment:    (NOTE) Pre diabetes:          5.7%-6.4% Diabetes:              >6.4% Glycemic control for   <7.0% adults with diabetes   03/22/2019 10:04 PM 7.2 (H) 4.8 - 5.6 % Final    Comment:    (NOTE) Pre diabetes:          5.7%-6.4% Diabetes:              >6.4% Glycemic control for   <7.0% adults with diabetes     CBG: Recent Labs  Lab 06/17/19 1232 06/17/19 1625 06/17/19 1957 06/18/19 0002 06/18/19 0744  GLUCAP 138* 137* 185* 151* 168*    Recent Results (from the past 240 hour(s))  Blood Culture (routine x 2)     Status: None   Collection Time: 06/12/19  2:46 PM   Specimen: BLOOD  Result Value Ref Range Status   Specimen Description   Final    BLOOD RIGHT ANTECUBITAL Performed at [facility], [place] [address], [place], [place] [address]    Special Requests   Final    BOTTLES DRAWN AEROBIC AND ANAEROBIC Blood Culture results may not be optimal due to an inadequate volume of blood received in culture bottles Performed at [place] [address] [place], [place] [address]    Culture   Final    NO GROWTH 5 DAYS Performed at [facility], [place] [address] [place], [place] [address]    Report Status 06/17/2019 FINAL  Final  Blood Culture (routine x 2)     Status: None   Collection Time: 06/12/19  3:30 PM   Specimen: Left Antecubital; Blood  Result Value Ref Range Status   Specimen Description   Final    LEFT ANTECUBITAL Performed at [place] [address]  [place], [place] [address]    Special Requests   Final    BOTTLES DRAWN AEROBIC AND ANAEROBIC Blood Culture  adequate volume Performed at [place] [facility] [address], [place], [place] [address]    Culture   Final    NO GROWTH 5 DAYS Performed at [facility], [place] [address], [place], [place] [address]    Report Status 06/17/2019 FINAL  Final  Urine Culture     Status: Abnormal   Collection Time: 06/12/19  4:56 PM   Specimen: Urine, Random  Result Value Ref Range Status   Specimen Description   Final    URINE, RANDOM Performed at [place] [address] [place], [place] [address]    Special Requests   Final    NONE Performed at [facility], [place] [address] [place] [address], [place], [place] [address]    Culture >=100,000 COLONIES/mL ESCHERICHIA COLI (A)  Final   Report Status 06/15/2019 FINAL  Final   Organism ID, Bacteria ESCHERICHIA COLI (A)  Final      Susceptibility   Escherichia coli - MIC*    AMPICILLIN 16 INTERMEDIATE Intermediate     CEFAZOLIN <=4 SENSITIVE Sensitive     CEFTRIAXONE <=0.25 SENSITIVE Sensitive     CIPROFLOXACIN <=0.25 SENSITIVE Sensitive     GENTAMICIN <=1 SENSITIVE Sensitive     IMIPENEM <=0.25 SENSITIVE Sensitive     NITROFURANTOIN <=16 SENSITIVE Sensitive     TRIMETH/SULFA <=20 SENSITIVE Sensitive     AMPICILLIN/SULBACTAM 4 SENSITIVE Sensitive     PIP/TAZO <=4 SENSITIVE Sensitive     * >=100,000 COLONIES/mL ESCHERICHIA COLI  MRSA PCR Screening     Status: None   Collection Time: 06/12/19  8:13 PM   Specimen: Nasopharyngeal  Result Value Ref Range Status   MRSA by PCR NEGATIVE NEGATIVE Final    Comment:        The GeneXpert MRSA Assay (FDA approved for NASAL specimens only), is one component of a comprehensive MRSA colonization surveillance program. It is not intended to diagnose MRSA infection nor to guide or monitor treatment for MRSA infections. Performed at [facility], [place] [address], [place], [place] [address]      Scheduled Meds: . Chlorhexidine Gluconate Cloth  6  each Topical Daily  . feeding supplement  1 Container Oral TID BM  . insulin aspart  0-9 Units Subcutaneous Q4H  . mouth rinse  15 mL Mouth Rinse BID  . sodium chloride flush  3 mL Intravenous Q12H     LOS: 6 days   Cherene Altes, MD [facility] Office  [phone] Pager - Text Page per Shea Evans  If 7PM-7AM, please contact night-coverage per Amion 06/18/2019, 9:20 AM

## 2019-06-18 NOTE — Progress Notes (Signed)
GV [id number] [facility] Liaison @1145   Received requested from [place] Fabio Pierce) that family is interested in [facility]. Chart reviewed and spoke with daughter Latanya Presser [phone] to acknowledge referral. Unfortunately [facility] is not able to offer a room today. Family and CSW are aware [facility] liaison will follow up tomorrow or sooner if room becomes available.   Please do not hesitate to call with questions.   Thank You.  Vicente Serene, BSN [facility] Liaison [phone]  [place] are on AMION

## 2019-06-18 NOTE — Clinical Social Work Note (Addendum)
Update Being reviewed for [facility]. No beds available today please follow up tomorrow.    CSW received message from NP [facility] requesting inpatient hospice information for patient's spouse.   CSW gave referral to Raina Mina [id number] who states that she will follow up with patient's wife RoseMarie.

## 2019-06-18 NOTE — Progress Notes (Signed)
[facility] Liaison @1045   RN received a call to contact pt's family for education on Hospice services.   RN spoke with pt's wife [name]) and discussed the different levels of care related to hospice services (Palliative, Home w/ hospice, [employer] and facility inpt with hospice services). Pt states she would like to discuss this information further with her two daughters and may decide what route to take on Monday. Spouse very appreciative and feels this was very good information.  Liaison provided contact number for hospice on the main line [phone] and after hours [phone] if further information needs to be discussed.   Thank you.  Vicente Serene, BSN [facility] [phone]  Liaisons are on AMION

## 2019-06-18 NOTE — Progress Notes (Addendum)
   Palliative Medicine Inpatient Follow Up Note   HPI: 86 y.o.malewith past medical history of dementia, AAA, DM, prostate cancer, HTN, HLDadmitted on 1/11/2021from SNF covid + with hypoxia and hypotension.Recent hospitalization 03/2019 due to failure to thrive and frequent falls discharged to SNF. Hospital admission for acute respiratory failure secondary to covid-19 with chest xray concerning for right midlung pneumonia, AKI/lactic acidosis, dehydration. Receiving IVF, antibiotic, steroid, and remdesivir. Palliative medicine consultation for   Today's Discussion (06/18/2019): Chart reviewed. Spoke to patients wife, [name] this morning. She was incredibly tear stricken and unable to engage in conversation for very long. We ended out conversation with sharing that she can take as long as is needed to come to terms with this difficult discussions.  Reached out to patients daughter Chong Sicilian this morning. Per our conversations her mother is feeling extremely overwhelmed and no absorbing the information presented to her. Patty had told me that she had spoke with [name] and her sister, Haynes Dage and they had decided that transitioning to comfort measures and him going to a hospice home was best.   Offered emotional support through therapeutic listening   We discussed what comfort care will look like once we transition in the hospital. Told patients family that we would inquire for a hospice home bed.   Questions and concerns addressed   Recommendations and Plan: Transitioned to comfort measures Prognosis hours to days and anticipate transfer to hospice home  Symptom Management: Dyspnea:  - Morphine 5mg  PO/SL Q1H PRN  - Morphine 1mg  IV Q1H PRN Pain:  - Tylenol 650mg  PO/PR Q6H PRN  - Heat Pack PRN Fever:  - Tylenol as above Agitation:  - Haldol 0.5mg  PO/SL/IV Q4H PRN Anxiety:  - Lorazepam 1mg  PO/SL/IV  Q1H PRN Nausea:  - Zofran 4mg  PO/IV Q6H PRN  Secretions:  - Glycopyrrolate  0.4mg  IV Q4H ATC Dry Eyes:  - Artificial Tears PRN Xerostomia:  - Biotene 26ml PRN  - BID oral care Urinary Retention:  - maintain foley  Constipation:  - Bisacodyl 10mg  PR PRN QDay Spiritual:  - Chaplain consult  Thank you for allowing the Palliative Medicine Team to assist in the care of this patient.  The above conversation was completed via telephone due to visitor restrictions during COVID-19 pandemic. Thorough chart review and discussion with multidisciplinary team was completed as part of assessment. No physical examination was performed.  Time Spent:35 Greater than 50% of the time was spent in counseling and coordination of care ______________________________________________________________________________________ Head of the [place] Team Team Cell Phone: [phone] Pleaseutilize secure chat with additional questions, if there is no response within 30 minutes please call the above phone number  Palliative Medicine Team providers are available by phone from 7am to 7pm daily and can be reached through the team cell phone.  Should this patient require assistance outside of these hours, please call the patient's attending physician.

## 2019-06-18 NOTE — Progress Notes (Signed)
CRITICAL VALUE ALERT  Critical Value:  Chloride  >130  Date & Time Notied:  06/18/2019 07:29  Provider Notified: Dr. Joette Catching  Orders Received/Actions taken: No new orders

## 2019-06-19 LAB — GLUCOSE, CAPILLARY: Glucose-Capillary: 118 mg/dL — ABNORMAL HIGH (ref 70–99)

## 2019-06-19 NOTE — Progress Notes (Signed)
Spoke to pt family to give update. They were appreciative.

## 2019-06-19 NOTE — Progress Notes (Signed)
Pt arrived to room 306 with RN transport. Pt on 2LNC.pt is non verbal at this time. VSS

## 2019-06-19 NOTE — Progress Notes (Signed)
[facility] does not have bed availability today. Pts can transfer to [facility] after 10 days from a Covid positive test and when pts are symptom free for 24 hours. Liaison notes that last positive test was on 1/11. Should placement be needed sooner, [facility] [place] [facility] taking [facility]ts with active covid but does not have bed availability today.   Please call with any questions and thank you for the referral.   Freddie Breech, RN [facility] Liaison [phone]

## 2019-06-19 NOTE — Progress Notes (Signed)
Omar Gomez  [id number] DOB: March 10, 1925 DOA: 06/12/2019 PCP: Wenda Low, MD    Brief Narrative:  86 year old SNF resident with a history of dementia, AAA, DM, HTN, HLD, and prostate cancer who presented to the ED with progressive shortness of breath over many days.  He was known to be Covid positive from testing at his facility 1/7.  In the emergency room he was found to be saturating 92% on 2 L nasal cannula support.  CXR noted scattered patchy opacities in the right midlung.  Significant Events: 1/7 Covid test positive at [facility] 1/11 admit via [facility] ED 1/12 transfer to [facility]  COVID-19 specific Treatment: Decadron 1/11 > 1/16 Remdesivir 1/11 > 1/15  Antimicrobials:  Cefepime 1/11 > 1/13 Rocephin 1/13 > 1/15 Vancomycin 1/11 > 1/12  Subjective: I visited the patient in his room.  There is no evidence of distress or discomfort.  He did not respond to my presence.  Assessment & Plan:  Covid pneumonia -acute hypoxic respiratory failure Discontinued decadron out of fear that it was worsening his delirium - completed a course of remdesivir - antibiotic was dosed due to concern for bacterial pneumonia - stopped vancomycin as MRSA screen negative  Acute kidney injury likely prerenal azotemia - crt improved w/ volume expansion   Possible low-grade GI bleed Discontinued subcutaneous heparin for DVT prophylaxis - Hgb stable   Dehydration with lactic acidosis  E. coli UTI POA (cx collected 1/11) Completed antibiotic therapy w/ rocephin for 5 days of tx   Hypokalemia Corrected  Hypernatremia / Hyperchloremia  Due to nil intake  DM 2 CBG reasonably controlled at this time  HTN Follow w/o change in tx for now   Disposition  Transition to comfort focused care -TOC is presently attempting to locate a bed within a residential hospice facility  DVT prophylaxis: SCDs Code Status: DNR - NO CODE Family Communication:  Disposition Plan: comfort focused care  Consultants:    Palliative Care   Objective: Blood pressure (!) 99/57, pulse (!) 58, temperature 97.6 F (36.4 C), temperature source Axillary, resp. rate 14, height 5\' 7"  (1.702 m), weight 71 kg, SpO2 (!) 87 %.  Intake/Output Summary (Last 24 hours) at 06/19/2019 1627 Last data filed at 06/18/2019 2308 Gross per 24 hour  Intake --  Output 710 ml  Net -710 ml   Filed Weights   06/12/19 2001  Weight: 71 kg    Examination: General: No acute respiratory distress - appears to be resting comfortably  CBC: Recent Labs  Lab 06/14/19 1100 06/14/19 1100 06/15/19 0157 06/15/19 0157 06/15/19 1200 06/16/19 0515 06/17/19 0755  WBC 13.0*   < > 15.7*   < > 16.6* 14.5* 14.0*  NEUTROABS 11.5*  --  13.9*  --   --  13.3*  --   HGB 11.1*   < > 9.7*   < > 10.0* 9.1* 9.2*  HCT 40.2   < > 31.4*   < > 32.7* 30.2* 30.7*  MCV 103.6*   < > 94.0   < > 93.4 94.7 94.5  PLT 244   < > 314   < > 326 288 274   < > = values in this interval not displayed.   Basic Metabolic Panel: Recent Labs  Lab 06/15/19 0157 06/15/19 0157 06/16/19 0515 06/17/19 0755 06/18/19 0336  NA 155*   < > 160* 157* 156*  K 2.6*   < > 3.8 3.5 3.4*  CL 129*   < > >130* >130* >130*  CO2  16*   < > 16* 16* 15*  GLUCOSE 123*   < > 189* 180* 98  BUN 61*   < > 71* 66* 57*  CREATININE 1.05   < > 1.33* 1.35* 1.22  CALCIUM 7.5*   < > 8.3* 8.3* 8.2*  MG 1.9  --   --   --   --   PHOS  --   --   --  2.8  --    < > = values in this interval not displayed.   GFR: Estimated Creatinine Clearance: 36.1 mL/min (by C-G formula based on SCr of 1.22 mg/dL).  Liver Function Tests: Recent Labs  Lab 06/13/19 0431 06/13/19 0431 06/14/19 1100 06/15/19 0157 06/16/19 0515 06/17/19 0755  AST 40  --  48* 43* 46*  --   ALT 33  --  32 27 28  --   ALKPHOS 85  --  85 78 87  --   BILITOT 0.6  --  0.7 0.5 0.4  --   PROT 6.8  --  6.5 5.5* 5.9*  --   ALBUMIN 2.1*   < > 2.0* 1.7* 1.8* 1.8*   < > = values in this interval not displayed.    HbA1C: Hgb  A1c MFr Bld  Date/Time Value Ref Range Status  06/12/2019 05:22 PM 6.8 (H) 4.8 - 5.6 % Final    Comment:    (NOTE) Pre diabetes:          5.7%-6.4% Diabetes:              >6.4% Glycemic control for   <7.0% adults with diabetes   03/22/2019 10:04 PM 7.2 (H) 4.8 - 5.6 % Final    Comment:    (NOTE) Pre diabetes:          5.7%-6.4% Diabetes:              >6.4% Glycemic control for   <7.0% adults with diabetes     CBG: Recent Labs  Lab 06/17/19 1957 06/18/19 0002 06/18/19 0452 06/18/19 0744 06/18/19 1152  GLUCAP 185* 151* 118* 168* 198*    Recent Results (from the past 240 hour(s))  Blood Culture (routine x 2)     Status: None   Collection Time: 06/12/19  2:46 PM   Specimen: BLOOD  Result Value Ref Range Status   Specimen Description   Final    BLOOD RIGHT ANTECUBITAL Performed at [facility], [place] [address], [place], [place] [address]    Special Requests   Final    BOTTLES DRAWN AEROBIC AND ANAEROBIC Blood Culture results may not be optimal due to an inadequate volume of blood received in culture bottles Performed at [place] [address] [place], [place] [address]    Culture   Final    NO GROWTH 5 DAYS Performed at [facility], [place] [address], [place], [place] [address]    Report Status 06/17/2019 FINAL  Final  Blood Culture (routine x 2)     Status: None   Collection Time: 06/12/19  3:30 PM   Specimen: Left Antecubital; Blood  Result Value Ref Range Status   Specimen Description   Final    LEFT ANTECUBITAL Performed at [facility] [address] [place], [place] [address]    Special Requests   Final    BOTTLES DRAWN AEROBIC AND ANAEROBIC Blood Culture adequate volume Performed at [place] [address] [place], [place] [address]  Culture   Final    NO GROWTH 5 DAYS Performed at [facility], [place] [address], [place], [place] [address]    Report Status  06/17/2019 FINAL  Final  Urine Culture     Status: Abnormal   Collection Time: 06/12/19  4:56 PM   Specimen: Urine, Random  Result Value Ref Range Status   Specimen Description   Final    URINE, RANDOM Performed at [facility] [address] [place], [place] [address]    Special Requests   Final    NONE Performed at [facility], [place] [address] [place], [place] [address]    Culture >=100,000 COLONIES/mL ESCHERICHIA COLI (A)  Final   Report Status 06/15/2019 FINAL  Final   Organism ID, Bacteria ESCHERICHIA COLI (A)  Final      Susceptibility   Escherichia coli - MIC*    AMPICILLIN 16 INTERMEDIATE Intermediate     CEFAZOLIN <=4 SENSITIVE Sensitive     CEFTRIAXONE <=0.25 SENSITIVE Sensitive     CIPROFLOXACIN <=0.25 SENSITIVE Sensitive     GENTAMICIN <=1 SENSITIVE Sensitive     IMIPENEM <=0.25 SENSITIVE Sensitive     NITROFURANTOIN <=16 SENSITIVE Sensitive     TRIMETH/SULFA <=20 SENSITIVE Sensitive     AMPICILLIN/SULBACTAM 4 SENSITIVE Sensitive     PIP/TAZO <=4 SENSITIVE Sensitive     * >=100,000 COLONIES/mL ESCHERICHIA COLI  MRSA PCR Screening     Status: None   Collection Time: 06/12/19  8:13 PM   Specimen: Nasopharyngeal  Result Value Ref Range Status   MRSA by PCR NEGATIVE NEGATIVE Final    Comment:        The GeneXpert MRSA Assay (FDA approved for NASAL specimens only), is one component of a comprehensive MRSA colonization surveillance program. It is not intended to diagnose MRSA infection nor to guide or monitor treatment for MRSA infections. Performed at [facility], [place] [address], [place], [place] [address]      Scheduled Meds:  glycopyrrolate  0.4 mg Intravenous Q4H   mouth rinse  15 mL Mouth Rinse BID     LOS: 7 days   Cherene Altes, MD [facility] Office  [phone] Pager - Text Page per Shea Evans  If 7PM-7AM, please contact night-coverage per Amion 06/19/2019, 4:27  PM

## 2019-06-19 NOTE — Plan of Care (Signed)
Pt transferred to room 306 at 0545 with RN transporting. Pt is to be CMO on today (possible). Pt remains unresponsive, 2LNC. Other VSS.    Problem: Education: Goal: Knowledge of risk factors and measures for prevention of condition will improve Outcome: Not Met (add Reason)   Problem: Respiratory: Goal: Will maintain a patent airway Outcome: Not Met (add Reason) Goal: Complications related to the disease process, condition or treatment will be avoided or minimized Outcome: Not Met (add Reason)   Problem: Education: Goal: Knowledge of General Education information will improve Description: Including pain rating scale, medication(s)/side effects and non-pharmacologic comfort measures Outcome: Not Met (add Reason)   Problem: Health Behavior/Discharge Planning: Goal: Ability to manage health-related needs will improve Outcome: Not Met (add Reason)   Problem: Clinical Measurements: Goal: Ability to maintain clinical measurements within normal limits will improve Outcome: Not Met (add Reason) Goal: Diagnostic test results will improve Outcome: Not Met (add Reason) Goal: Respiratory complications will improve Outcome: Not Met (add Reason)   Problem: Safety: Goal: Ability to remain free from injury will improve Outcome: Not Met (add Reason)   Problem: Skin Integrity: Goal: Risk for impaired skin integrity will decrease Outcome: Not Met (add Reason)

## 2019-06-19 NOTE — Progress Notes (Addendum)
Family Update: Attempt to call family r/t patient transfer to room 306 was unsuccessful on two attempts. First attempt at 0558 to spouse, Quentin Mulling and second attempt to daughter, Haynes Dage [phone]).   Late Entry for 01/18.21 @ 2054: On call provider notified r/t staff nurse concerns for making patient NPO second to current condition. Reminded MD that patient is on comfort care at this time.

## 2019-06-20 NOTE — Progress Notes (Signed)
Omar Gomez  [id number] DOB: [birth year] DOA: 06/12/2019 PCP: Wenda Low, MD    Brief Narrative:   86 year old SNF resident with a history of dementia, AAA, DM, HTN, HLD, and prostate cancer who presented to the ED with progressive shortness of breath over many days.  He was known to be Covid positive from testing at his facility 1/7.  In the emergency room he was found to be saturating 92% on 2 L nasal cannula support.  CXR noted scattered patchy opacities in the right midlung.  Significant Events: 1/7 Covid test positive at [facility] 1/11 admit via [facility] ED 1/12 transfer to [facility]  COVID-19 specific Treatment: Decadron 1/11 > 1/16 Remdesivir 1/11 > 1/15  Antimicrobials:  Cefepime 1/11 > 1/13 Rocephin 1/13 > 1/15 Vancomycin 1/11 > 1/12  Subjective: Patient is comatose, cannot provide any complaints, no acute events as discussed with staff  Assessment & Plan:  Covid pneumonia -acute hypoxic respiratory failure Discontinued decadron out of fear that it was worsening his delirium - completed a course of remdesivir - antibiotic was dosed due to concern for bacterial pneumonia - stopped vancomycin as MRSA screen negative  Acute kidney injury likely prerenal azotemia - crt improved w/ volume expansion   Possible low-grade GI bleed Discontinued subcutaneous heparin for DVT prophylaxis - Hgb stable   Dehydration with lactic acidosis  E. coli UTI POA (cx collected 1/11) Completed antibiotic therapy w/ rocephin for 5 days of tx   Hypokalemia Corrected  Hypernatremia / Hyperchloremia  Due to nil intake  DM 2 CBG reasonably controlled at this time  HTN Follow w/o change in tx for now   Disposition  Patient has been transitioned to comfort care, currently appears to be comfortable, continue with current comfort measures .  DVT prophylaxis: SCDs Code Status: DNR - NO CODE Family Communication:  Disposition Plan: comfort focused care  Consultants:  Palliative Care    Objective: Blood pressure 135/67, pulse (!) 58, temperature 97.6 F (36.4 C), temperature source Axillary, resp. rate 18, height 5\' 7"  (1.702 m), weight 71 kg, SpO2 98 %.  Intake/Output Summary (Last 24 hours) at 06/20/2019 1441 Last data filed at 06/20/2019 0749 Gross per 24 hour  Intake 0 ml  Output 950 ml  Net -950 ml   Filed Weights   06/12/19 2001  Weight: 71 kg    Examination:  Patient laying in bed, unresponsive, appears comfortable Shallow breathing bilaterally Extremities with no cyanosis   CBC: Recent Labs  Lab 06/14/19 1100 06/14/19 1100 06/15/19 0157 06/15/19 0157 06/15/19 1200 06/16/19 0515 06/17/19 0755  WBC 13.0*   < > 15.7*   < > 16.6* 14.5* 14.0*  NEUTROABS 11.5*  --  13.9*  --   --  13.3*  --   HGB 11.1*   < > 9.7*   < > 10.0* 9.1* 9.2*  HCT 40.2   < > 31.4*   < > 32.7* 30.2* 30.7*  MCV 103.6*   < > 94.0   < > 93.4 94.7 94.5  PLT 244   < > 314   < > 326 288 274   < > = values in this interval not displayed.   Basic Metabolic Panel: Recent Labs  Lab 06/15/19 0157 06/15/19 0157 06/16/19 0515 06/17/19 0755 06/18/19 0336  NA 155*   < > 160* 157* 156*  K 2.6*   < > 3.8 3.5 3.4*  CL 129*   < > >130* >130* >130*  CO2 16*   < > 16*  16* 15*  GLUCOSE 123*   < > 189* 180* 98  BUN 61*   < > 71* 66* 57*  CREATININE 1.05   < > 1.33* 1.35* 1.22  CALCIUM 7.5*   < > 8.3* 8.3* 8.2*  MG 1.9  --   --   --   --   PHOS  --   --   --  2.8  --    < > = values in this interval not displayed.   GFR: Estimated Creatinine Clearance: 36.1 mL/min (by C-G formula based on SCr of 1.22 mg/dL).  Liver Function Tests: Recent Labs  Lab 06/14/19 1100 06/15/19 0157 06/16/19 0515 06/17/19 0755  AST 48* 43* 46*  --   ALT 32 27 28  --   ALKPHOS 85 78 87  --   BILITOT 0.7 0.5 0.4  --   PROT 6.5 5.5* 5.9*  --   ALBUMIN 2.0* 1.7* 1.8* 1.8*    HbA1C: Hgb A1c MFr Bld  Date/Time Value Ref Range Status  06/12/2019 05:22 PM 6.8 (H) 4.8 - 5.6 % Final    Comment:     (NOTE) Pre diabetes:          5.7%-6.4% Diabetes:              >6.4% Glycemic control for   <7.0% adults with diabetes   03/22/2019 10:04 PM 7.2 (H) 4.8 - 5.6 % Final    Comment:    (NOTE) Pre diabetes:          5.7%-6.4% Diabetes:              >6.4% Glycemic control for   <7.0% adults with diabetes     CBG: Recent Labs  Lab 06/17/19 1957 06/18/19 0002 06/18/19 0452 06/18/19 0744 06/18/19 1152  GLUCAP 185* 151* 118* 168* 198*    Recent Results (from the past 240 hour(s))  Blood Culture (routine x 2)     Status: None   Collection Time: 06/12/19  2:46 PM   Specimen: BLOOD  Result Value Ref Range Status   Specimen Description   Final    BLOOD RIGHT ANTECUBITAL Performed at [facility], [place] [address] [place], [place] [address]    Special Requests   Final    BOTTLES DRAWN AEROBIC AND ANAEROBIC Blood Culture results may not be optimal due to an inadequate volume of blood received in culture bottles Performed at [facility] [address] [place], [place] [address]    Culture   Final    NO GROWTH 5 DAYS Performed at [facility], [place] [address], [place], [place] [address]    Report Status 06/17/2019 FINAL  Final  Blood Culture (routine x 2)     Status: None   Collection Time: 06/12/19  3:30 PM   Specimen: Left Antecubital; Blood  Result Value Ref Range Status   Specimen Description   Final    LEFT ANTECUBITAL Performed at [facility] [address], [place], [place] [address]    Special Requests   Final    BOTTLES DRAWN AEROBIC AND ANAEROBIC Blood Culture adequate volume Performed at [facility] [address], [place], [place] [address]    Culture   Final    NO GROWTH 5 DAYS Performed at [facility], [place] [address] [place], [place] [address]    Report Status 06/17/2019 FINAL  Final  Urine Culture     Status: Abnormal  Collection Time: 06/12/19  4:56 PM   Specimen:  Urine, Random  Result Value Ref Range Status   Specimen Description   Final    URINE, RANDOM Performed at [facility] [address] [place], [place] [address]    Special Requests   Final    NONE Performed at [facility], [place] [address], [place], [place] [address]    Culture >=100,000 COLONIES/mL ESCHERICHIA COLI (A)  Final   Report Status 06/15/2019 FINAL  Final   Organism ID, Bacteria ESCHERICHIA COLI (A)  Final      Susceptibility   Escherichia coli - MIC*    AMPICILLIN 16 INTERMEDIATE Intermediate     CEFAZOLIN <=4 SENSITIVE Sensitive     CEFTRIAXONE <=0.25 SENSITIVE Sensitive     CIPROFLOXACIN <=0.25 SENSITIVE Sensitive     GENTAMICIN <=1 SENSITIVE Sensitive     IMIPENEM <=0.25 SENSITIVE Sensitive     NITROFURANTOIN <=16 SENSITIVE Sensitive     TRIMETH/SULFA <=20 SENSITIVE Sensitive     AMPICILLIN/SULBACTAM 4 SENSITIVE Sensitive     PIP/TAZO <=4 SENSITIVE Sensitive     * >=100,000 COLONIES/mL ESCHERICHIA COLI  MRSA PCR Screening     Status: None   Collection Time: 06/12/19  8:13 PM   Specimen: Nasopharyngeal  Result Value Ref Range Status   MRSA by PCR NEGATIVE NEGATIVE Final    Comment:        The GeneXpert MRSA Assay (FDA approved for NASAL specimens only), is one component of a comprehensive MRSA colonization surveillance program. It is not intended to diagnose MRSA infection nor to guide or monitor treatment for MRSA infections. Performed at [facility], [place] [address], [place], [place] [address]      Scheduled Meds: . glycopyrrolate  0.4 mg Intravenous Q4H  . mouth rinse  15 mL Mouth Rinse BID     LOS: 8 days   Phillips Climes, MD [facility] Pager - Text Page per Shea Evans  If 7PM-7AM, please contact night-coverage per Amion 06/20/2019, 2:41 PM

## 2019-06-20 NOTE — Progress Notes (Signed)
Called pt daughter Chong Sicilian and adv her that she can come to her final visit with pt between 1200-1600. Adv her talk to her family and give [place] a call tmrrw morning and let us know what they would like to do and we'll make it happen.

## 2019-06-20 NOTE — Plan of Care (Signed)
P Problem: Education: Goal: Knowledge of risk factors and measures for prevention of condition will improve Outcome: Not Met (add Reason)   Problem: Respiratory: Goal: Will maintain a patent airway Outcome: Not Met (add Reason) Goal: Complications related to the disease process, condition or treatment will be avoided or minimized Outcome: Not Met (add Reason)   Problem: Education: Goal: Knowledge of General Education information will improve Description: Including pain rating scale, medication(s)/side effects and non-pharmacologic comfort measures Outcome: Not Met (add Reason)   Problem: Health Behavior/Discharge Planning: Goal: Ability to manage health-related needs will improve Outcome: Not Met (add Reason)   Problem: Clinical Measurements: Goal: Ability to maintain clinical measurements within normal limits will improve Outcome: Not Met (add Reason) Goal: Diagnostic test results will improve Outcome: Not Met (add Reason) Goal: Respiratory complications will improve Outcome: Not Met (add Reason)   Problem: Safety: Goal: Ability to remain free from injury will improve Outcome: Not Met (add Reason)   Problem: Skin Integrity: Goal: Risk for impaired skin integrity will decrease Outcome: Not Met (add Reason)

## 2019-06-20 NOTE — Progress Notes (Signed)
Nutrition Brief Note  Chart reviewed. Patient receiving comfort care.  Plans for d/c to Hospice facility.  No further nutrition interventions warranted at this time.  Please re-consult as needed.    Molli Barrows, RD, LDN, [place] Pager [phone] After Hours Pager [phone]

## 2019-06-20 NOTE — Progress Notes (Signed)
Facetime call with daughter Chong Sicilian and pt. She is very appreciative for opportunity to see pt and talk to him. She also wants to set up a time for a family visit [id number]. Adv her I would talk to charge nurse and call her back

## 2019-06-20 NOTE — Clinical Social Work Note (Signed)
CSW called and left message for [facility] liaison Bevely Palmer [phone] inquiring about inpatient bed availability.   CSW left voicemail. Awaiting return call.

## 2019-06-20 NOTE — Progress Notes (Signed)
Spoke to pt daughter to give her update. Pt very appreciative for update.

## 2019-06-21 NOTE — Progress Notes (Signed)
PROGRESS NOTE  Omar Gomez [id number] DOB: [birth year] DOA: 06/12/2019 PCP: Wenda Low, MD   LOS: 9 days   Brief Narrative / Interim history: 86 year old SNF resident with a history of dementia, AAA, DM, HTN, HLD, and prostate cancer who presented to the ED with progressive shortness of breath over many days.  He was known to be Covid positive from testing at his facility 1/7.  In the emergency room he was found to be saturating 92% on 2 L nasal cannula support.  CXR noted scattered patchy opacities in the right midlung.  Significant Events: 1/7 Covid test positive at [facility] 1/11 admit via [facility] ED 1/12 transfer to [facility]  COVID-19 specific Treatment: Decadron 1/11 > 1/16 Remdesivir 1/11 > 1/15  Antimicrobials:  Cefepime 1/11 > 1/13 Rocephin 1/13 > 1/15 Vancomycin 1/11 > 1/12  Subjective / 24h Interval events: Patient comatose, eyes are open but does not interact  Assessment & Plan:  Principal Problem Acute Hypoxic Respiratory Failure due to Covid-19 Viral Illness -Patient initially hypoxic, he was started on remdesivir and completed the course, as well as antibiotics.  Despite treatment he has not improved and has remained obtunded, and after discussion with the family he was transitioned towards comfort care and he will require residential hospice  Active Problems Acute kidney injury -likely prerenal azotemia - crt improved w/ volume expansion   Possible low-grade GI bleed -Discontinued subcutaneous heparin for DVT prophylaxis - Hgb stable   Dehydration with lactic acidosis  E. coli UTI POA (cx collected 1/11) -Completed antibiotic therapy w/ rocephin for 5 days of tx   Hypokalemia -Corrected  Hypernatremia / Hyperchloremia  -Due to nil intake  DM 2 -CBG reasonably controlled at this time  HTN -Follow w/o change in tx for now   Disposition  Patient has been transitioned to comfort care, currently appears to be comfortable, continue with current  comfort measures .  Scheduled Meds: . glycopyrrolate  0.4 mg Intravenous Q4H  . mouth rinse  15 mL Mouth Rinse BID   Continuous Infusions: PRN Meds:.acetaminophen **OR** acetaminophen, antiseptic oral rinse, artificial tears, bisacodyl, chlorpheniramine-HYDROcodone, guaiFENesin-dextromethorphan, haloperidol **OR** haloperidol **OR** haloperidol lactate, Ipratropium-Albuterol, LORazepam **OR** LORazepam **OR** LORazepam, morphine injection, morphine CONCENTRATE **OR** morphine CONCENTRATE, ondansetron **OR** ondansetron (ZOFRAN) IV  DVT prophylaxis: Comfort care Code Status: DNR Patient admitted from: Home Anticipated d/c place: Residential hospice Barriers to d/c: Per nursing notes 10 days from Covid diagnosis need to pass, tomorrow will be 10-day before he can be placed  Consultants:  Palliative care  Procedures:  None   Microbiology: None   Antimicrobials: None    Objective: Vitals:   06/20/19 0839 06/20/19 1613 06/21/19 0356 06/21/19 0758  BP: 135/67 111/67 111/65 93/65  Pulse: (!) 58 62 60 67  Resp: 18 18 16 18   TempSrc: Axillary Axillary Other (Comment) Axillary  SpO2: 98% 90% 94% (!) 88%  Weight:      Height:        Intake/Output Summary (Last 24 hours) at 06/21/2019 1532 Last data filed at 06/20/2019 2100 Gross per 24 hour  Intake 0 ml  Output 150 ml  Net -150 ml   Filed Weights   06/12/19 2001  Weight: 71 kg    Examination:  Constitutional: Eyes are open but unresponsive does not track me  Data Reviewed: I have independently reviewed following labs and imaging studies   CBC: Recent Labs  Lab 06/15/19 0157 06/15/19 1200 06/16/19 0515 06/17/19 0755  WBC 15.7* 16.6* 14.5* 14.0*  NEUTROABS 13.9*  --  13.3*  --   HGB 9.7* 10.0* 9.1* 9.2*  HCT 31.4* 32.7* 30.2* 30.7*  MCV 94.0 93.4 94.7 94.5  PLT 314 326 288 [id number]   Basic Metabolic Panel: Recent Labs  Lab 06/15/19 0157 06/16/19 0515 06/17/19 0755 06/18/19 0336  NA 155* 160* 157* 156*  K  2.6* 3.8 3.5 3.4*  CL 129* >130* >130* >130*  CO2 16* 16* 16* 15*  GLUCOSE 123* 189* 180* 98  BUN 61* 71* 66* 57*  CREATININE 1.05 1.33* 1.35* 1.22  CALCIUM 7.5* 8.3* 8.3* 8.2*  MG 1.9  --   --   --   PHOS  --   --  2.8  --    GFR: Estimated Creatinine Clearance: 36.1 mL/min (by C-G formula based on SCr of 1.22 mg/dL). Liver Function Tests: Recent Labs  Lab 06/15/19 0157 06/16/19 0515 06/17/19 0755  AST 43* 46*  --   ALT 27 28  --   ALKPHOS 78 87  --   BILITOT 0.5 0.4  --   PROT 5.5* 5.9*  --   ALBUMIN 1.7* 1.8* 1.8*   No results for input(s): LIPASE, AMYLASE in the last 168 hours. No results for input(s): AMMONIA in the last 168 hours. Coagulation Profile: No results for input(s): INR, PROTIME in the last 168 hours. Cardiac Enzymes: No results for input(s): CKTOTAL, CKMB, CKMBINDEX, TROPONINI in the last 168 hours. BNP (last 3 results) No results for input(s): PROBNP in the last 8760 hours. HbA1C: No results for input(s): HGBA1C in the last 72 hours. CBG: Recent Labs  Lab 06/17/19 1957 06/18/19 0002 06/18/19 0452 06/18/19 0744 06/18/19 1152  GLUCAP 185* 151* 118* 168* 198*   Lipid Profile: No results for input(s): CHOL, HDL, LDLCALC, TRIG, CHOLHDL, LDLDIRECT in the last 72 hours. Thyroid Function Tests: No results for input(s): TSH, T4TOTAL, FREET4, T3FREE, THYROIDAB in the last 72 hours. Anemia Panel: No results for input(s): VITAMINB12, FOLATE, FERRITIN, TIBC, IRON, RETICCTPCT in the last 72 hours. Urine analysis:    Component Value Date/Time   COLORURINE AMBER (A) 06/12/2019 1656   APPEARANCEUR CLOUDY (A) 06/12/2019 1656   LABSPEC 1.023 06/12/2019 1656   PHURINE 5.0 06/12/2019 1656   GLUCOSEU NEGATIVE 06/12/2019 1656   HGBUR LARGE (A) 06/12/2019 1656   BILIRUBINUR NEGATIVE 06/12/2019 1656   KETONESUR NEGATIVE 06/12/2019 1656   PROTEINUR 100 (A) 06/12/2019 1656   NITRITE NEGATIVE 06/12/2019 1656   LEUKOCYTESUR LARGE (A) 06/12/2019 1656   Sepsis  Labs: Invalid input(s): PROCALCITONIN, LACTICIDVEN  Recent Results (from the past 240 hour(s))  Blood Culture (routine x 2)     Status: None   Collection Time: 06/12/19  2:46 PM   Specimen: BLOOD  Result Value Ref Range Status   Specimen Description   Final    BLOOD RIGHT ANTECUBITAL Performed at [facility], [address] [place], [place] [address]    Special Requests   Final    BOTTLES DRAWN AEROBIC AND ANAEROBIC Blood Culture results may not be optimal due to an inadequate volume of blood received in culture bottles Performed at [place] [address], [place], [place] [address]    Culture   Final    NO GROWTH 5 DAYS Performed at [facility], [place] [address], [place], [place] [address]    Report Status 06/17/2019 FINAL  Final  Blood Culture (routine x 2)     Status: None   Collection Time: 06/12/19  3:30 PM   Specimen: Left Antecubital; Blood  Result Value Ref Range Status   Specimen Description   Final    LEFT ANTECUBITAL Performed at [facility] [address], [place], [place] [address]    Special Requests   Final    BOTTLES DRAWN AEROBIC AND ANAEROBIC Blood Culture adequate volume Performed at [facility] [address], [place], [place] [address]    Culture   Final    NO GROWTH 5 DAYS Performed at [facility], [place] [address], [place], [place] [address]    Report Status 06/17/2019 FINAL  Final  Urine Culture     Status: Abnormal   Collection Time: 06/12/19  4:56 PM   Specimen: Urine, Random  Result Value Ref Range Status   Specimen Description   Final    URINE, RANDOM Performed at [facility] [address], [place], [place] [address]    Special Requests   Final    NONE Performed at [facility], [place] [address] [place], [place] [address]    Culture >=100,000 COLONIES/mL ESCHERICHIA COLI (A)  Final   Report Status 06/15/2019 FINAL   Final   Organism ID, Bacteria ESCHERICHIA COLI (A)  Final      Susceptibility   Escherichia coli - MIC*    AMPICILLIN 16 INTERMEDIATE Intermediate     CEFAZOLIN <=4 SENSITIVE Sensitive     CEFTRIAXONE <=0.25 SENSITIVE Sensitive     CIPROFLOXACIN <=0.25 SENSITIVE Sensitive     GENTAMICIN <=1 SENSITIVE Sensitive     IMIPENEM <=0.25 SENSITIVE Sensitive     NITROFURANTOIN <=16 SENSITIVE Sensitive     TRIMETH/SULFA <=20 SENSITIVE Sensitive     AMPICILLIN/SULBACTAM 4 SENSITIVE Sensitive     PIP/TAZO <=4 SENSITIVE Sensitive     * >=100,000 COLONIES/mL ESCHERICHIA COLI  MRSA PCR Screening     Status: None   Collection Time: 06/12/19  8:13 PM   Specimen: Nasopharyngeal  Result Value Ref Range Status   MRSA by PCR NEGATIVE NEGATIVE Final    Comment:        The GeneXpert MRSA Assay (FDA approved for NASAL specimens only), is one component of a comprehensive MRSA colonization surveillance program. It is not intended to diagnose MRSA infection nor to guide or monitor treatment for MRSA infections. Performed at [facility], [place] [address] [place], [place] [address]       Radiology Studies: No results found.   Marzetta Board, MD, PhD [facility]  Contact via  [email or web address]  [place] P: [phone] F: [phone]

## 2019-06-21 NOTE — Progress Notes (Signed)
Chaplain visiting with pt due to consult re: end of life.  Provided pastoral presence at bedside.  Mr Delcore had eyes open at beginning of visit, but unresponsive to chaplain presence.  Mr Scholtes closed eyes during chaplain visit.      Jerene Pitch, MDiv, [facility]  Lead Clinical Chaplain  [facility]

## 2019-06-21 NOTE — TOC Progression Note (Signed)
Transition of Care [facility]) - Progression Note    Patient Details  Name: Omar Gomez MRN: [id number] Date of Birth: 07-Jul-1924  Transition of Care [facility]) CM/SW [place], LCSW Phone Number: 06/21/2019, 1:28 PM  Clinical Narrative:     CSW spoke with Venia Carbon with [facility] and she states "he he has to be 10 days out from his + date, which will be tomorrow, also cannot have a fever or covid s/s. i will look at him again to see if he might be eligible tomorrow!  CSW will follow up with liaison on tomorrow 1/21 to inquire about bed availability at [facility].   Expected Discharge Plan: Skilled Nursing Facility Barriers to Discharge: Continued Medical Work up  Expected Discharge Plan and Services Expected Discharge Plan: [place]                                               Social Determinants of Health (SDOH) Interventions    Readmission Risk Interventions No flowsheet data found.

## 2019-06-21 NOTE — Progress Notes (Signed)
Called pt daughter and gave update. She was very appreciative the update. Facetimed with her as well.

## 2019-06-22 DIAGNOSIS — Z515 Encounter for palliative care: Secondary | ICD-10-CM

## 2019-06-22 DIAGNOSIS — J9601 Acute respiratory failure with hypoxia: Secondary | ICD-10-CM

## 2019-06-22 DIAGNOSIS — F0390 Unspecified dementia without behavioral disturbance: Secondary | ICD-10-CM

## 2019-06-22 MED ORDER — LORAZEPAM 2 MG/ML PO CONC: 1.0000 mg | ORAL | 0 refills | Status: AC | PRN

## 2019-06-22 MED ORDER — MORPHINE SULFATE (CONCENTRATE) 10 MG/0.5ML PO SOLN: 5.0000 mg | ORAL | 0 refills | Status: AC | PRN

## 2019-06-22 MED ORDER — HALOPERIDOL LACTATE 2 MG/ML PO CONC: 0.6000 mg | ORAL | 0 refills | Status: AC | PRN

## 2019-06-22 NOTE — Progress Notes (Signed)
[facility] Documentation   Pt has been approved for [employer] transfer. [facility] does have a bed available for pt today. Liaison will let SW know when all paperwork has been completed and transportation can be arranged.    Please call [place] at [phone] to give charge nurse report and fax discharge summary to [phone].   Please dc any lines. May leave catheter in place if pt has one. Please send pt to [facility] with DNR paperwork.    Please call with any questions.    Thank you,  Freddie Breech, RN [facility] Liaison  [phone]

## 2019-06-22 NOTE — Plan of Care (Signed)
  Problem: Education: Goal: Knowledge of risk factors and measures for prevention of condition will improve Outcome: Not Met (add Reason)   Problem: Respiratory: Goal: Will maintain a patent airway Outcome: Not Met (add Reason) Goal: Complications related to the disease process, condition or treatment will be avoided or minimized Outcome: Not Met (add Reason)   Problem: Education: Goal: Knowledge of General Education information will improve Description: Including pain rating scale, medication(s)/side effects and non-pharmacologic comfort measures Outcome: Not Met (add Reason)   Problem: Health Behavior/Discharge Planning: Goal: Ability to manage health-related needs will improve Outcome: Not Met (add Reason)   Problem: Clinical Measurements: Goal: Ability to maintain clinical measurements within normal limits will improve Outcome: Not Met (add Reason) Goal: Diagnostic test results will improve Outcome: Not Met (add Reason) Goal: Respiratory complications will improve Outcome: Not Met (add Reason)   Problem: Safety: Goal: Ability to remain free from injury will improve Outcome: Not Met (add Reason)   Problem: Skin Integrity: Goal: Risk for impaired skin integrity will decrease Outcome: Not Met (add Reason)

## 2019-06-22 NOTE — Discharge Summary (Signed)
Physician Discharge Summary  Omar Gomez [id number] DOB: [birth year] DOA: 06/12/2019  PCP: Wenda Low, MD  Admit date: 06/12/2019 Discharge date: 06/22/2019  Admitted From: SNF Disposition: Residential hospice/[facility]  Recommendations for Outpatient Follow-up:  Follow up with hospice services  Home Health: none Equipment/Devices: none  CODE STATUS: DNR Diet recommendation: comfort feeding  HPI: Per admitting MD, Omar Gomez is a 86 y.o. male with medical history significant for dementia, AAA, diabetes mellitus, hypertension, hyperlipidemia, prostate CA, presents to the ER from SNF with complaints of worsening shortness of breath, hypoxia for the past few days.  Patient tested positive at SNF on 06/08/2019.  At the facility, O2 sats were noted to be about 88% on 2 L, increased to 5 L, saturating above 90%, found patient to also be hypotensive and altered. Pt was recently admitted here in 03/2019 due to failure to thrive/frequent falls and was discharged to SNF.  Patient unable to give any history.  Spoke to his spouse over the phone for more information ED Course: Patient noted to be tachypneic, saturating 92% on 2 L of O2, temp not on file, labs showed sodium of 147, potassium 3.3, creatinine 1.49, baseline creatinine normal, troponin of 39, BNP 147.7, ferritin greater than 3000, CRP 18.2, lactic acid 2.5, procalcitonin 1.01, WBC 14.7, D-dimer 2.94, fibrinogen greater than 800, chest x-ray with few patchy opacities in the right midlung concerning for pneumonia.  Hospital Course / Discharge diagnoses: Principal Problem Acute Hypoxic Respiratory Failure due to Covid-19 Viral Illness -Patient initially hypoxic, he was started on remdesivir and completed the course, as well as antibiotics.  Despite treatment he has not improved and has remained obtunded, and after discussion with the family he was transitioned towards comfort care and he will require residential  hospice  Active Problems Acute kidney injury -likely prerenal azotemia - crt improved w/ volume expansion  Possible low-grade GI bleed  Dehydration with lactic acidosis E. coli UTI POA (cx collected 1/11) -Completed antibiotic therapy w/ rocephin for 5 days of tx  Hypokalemia -Corrected Hypernatremia / Hyperchloremia  -Due to nil intake DM 2 HTN  Disposition Patient has been transitioned to comfort care, currently appears to be comfortable, continue with current comfort measures.  Discharge Instructions   Allergies as of 06/22/2019   No Known Allergies     Medication List    STOP taking these medications   aspirin EC 81 MG tablet   atenolol 25 MG tablet Commonly known as: TENORMIN   atorvastatin 80 MG tablet Commonly known as: LIPITOR   cholecalciferol 25 MCG (1000 UNIT) tablet Commonly known as: VITAMIN D   dexamethasone 4 MG tablet Commonly known as: DECADRON   ezetimibe 10 MG tablet Commonly known as: ZETIA   metFORMIN 500 MG tablet Commonly known as: GLUCOPHAGE   Santyl ointment Generic drug: collagenase   vitamin C 250 MG tablet Commonly known as: ASCORBIC ACID   zinc gluconate 50 MG tablet     TAKE these medications   acetaminophen 500 MG tablet Commonly known as: TYLENOL Take 500 mg by mouth every 6 (six) hours as needed for mild pain or headache. Take 2 tablets every 6 hours.   haloperidol 2 MG/ML solution Commonly known as: HALDOL Place 0.3 mLs (0.6 mg total) under the tongue every 4 (four) hours as needed for agitation (or delirium).   LORazepam 2 MG/ML concentrated solution Commonly known as: ATIVAN Place 0.5 mLs (1 mg total) under the tongue every hour as needed for anxiety.  morphine CONCENTRATE 10 MG/0.5ML Soln concentrated solution Place 0.25 mLs (5 mg total) under the tongue every hour as needed for moderate pain (or dyspnea).        Consultations:  none  Procedures/Studies:  DG Chest Port 1 View  Result Date:  06/12/2019 CLINICAL DATA:  Shortness of breath.  COVID positive. EXAM: PORTABLE CHEST 1 VIEW COMPARISON:  None. FINDINGS: The heart size and mediastinal contours are within normal limits. Atherosclerotic calcification of the aortic arch. Normal pulmonary vascularity. There are few patchy opacities in the right mid lung. No pleural effusion or pneumothorax. Skin fold over the peripheral right lung. No acute osseous abnormality. IMPRESSION: 1. Few patchy opacities in the right mid lung, concerning for pneumonia. Electronically Signed   By: Titus Dubin M.D.   On: 06/12/2019 15:46      Subjective: unresponsive  Discharge Exam: BP (!) 99/57 (BP Location: Right Arm)   Pulse (!) 58   Temp (!) 94 F (34.4 C) (Other (Comment)) Comment (Src): groin  Resp 20   Ht 5\' 7"  (1.702 m)   Wt 71 kg   SpO2 90%   BMI 24.53 kg/m   General: poorly responsive   The results of significant diagnostics from this hospitalization (including imaging, microbiology, ancillary and laboratory) are listed below for reference.     Microbiology: Recent Results (from the past 240 hour(s))  Blood Culture (routine x 2)     Status: None   Collection Time: 06/12/19  2:46 PM   Specimen: BLOOD  Result Value Ref Range Status   Specimen Description   Final    BLOOD RIGHT ANTECUBITAL Performed at [facility], [address], [place], [place] [address]    Special Requests   Final    BOTTLES DRAWN AEROBIC AND ANAEROBIC Blood Culture results may not be optimal due to an inadequate volume of blood received in culture bottles Performed at [facility] [address], [place], [place] [address]    Culture   Final    NO GROWTH 5 DAYS Performed at [facility], [place] [address] [place], [place] [address]    Report Status 06/17/2019 FINAL  Final  Blood Culture (routine x 2)     Status: None   Collection Time: 06/12/19  3:30 PM   Specimen: Left Antecubital; Blood  Result Value Ref Range  Status   Specimen Description   Final    LEFT ANTECUBITAL Performed at [facility] [address], [place], [place] [address]    Special Requests   Final    BOTTLES DRAWN AEROBIC AND ANAEROBIC Blood Culture adequate volume Performed at [facility] [address] [place], [place] [address]    Culture   Final    NO GROWTH 5 DAYS Performed at [facility], [place] [address], [place], [place] [address]    Report Status 06/17/2019 FINAL  Final  Urine Culture     Status: Abnormal   Collection Time: 06/12/19  4:56 PM   Specimen: Urine, Random  Result Value Ref Range Status   Specimen Description   Final    URINE, RANDOM Performed at [facility] [address], [place], [place] [address]    Special Requests   Final    NONE Performed at [facility], [place] [address] [place], [place] [address]    Culture >=100,000 COLONIES/mL ESCHERICHIA COLI (A)  Final   Report Status 06/15/2019 FINAL  Final  Organism ID, Bacteria ESCHERICHIA COLI (A)  Final      Susceptibility   Escherichia coli - MIC*    AMPICILLIN 16 INTERMEDIATE Intermediate     CEFAZOLIN <=4 SENSITIVE Sensitive     CEFTRIAXONE <=0.25 SENSITIVE Sensitive     CIPROFLOXACIN <=0.25 SENSITIVE Sensitive     GENTAMICIN <=1 SENSITIVE Sensitive     IMIPENEM <=0.25 SENSITIVE Sensitive     NITROFURANTOIN <=16 SENSITIVE Sensitive     TRIMETH/SULFA <=20 SENSITIVE Sensitive     AMPICILLIN/SULBACTAM 4 SENSITIVE Sensitive     PIP/TAZO <=4 SENSITIVE Sensitive     * >=100,000 COLONIES/mL ESCHERICHIA COLI  MRSA PCR Screening     Status: None   Collection Time: 06/12/19  8:13 PM   Specimen: Nasopharyngeal  Result Value Ref Range Status   MRSA by PCR NEGATIVE NEGATIVE Final    Comment:        The GeneXpert MRSA Assay (FDA approved for NASAL specimens only), is one component of a comprehensive MRSA colonization surveillance program. It is not intended to  diagnose MRSA infection nor to guide or monitor treatment for MRSA infections. Performed at [facility], [place] [address], [place], [place] [address]      Labs: Basic Metabolic Panel: Recent Labs  Lab 06/16/19 0515 06/17/19 0755 06/18/19 0336  NA 160* 157* 156*  K 3.8 3.5 3.4*  CL >130* >130* >130*  CO2 16* 16* 15*  GLUCOSE 189* 180* 98  BUN 71* 66* 57*  CREATININE 1.33* 1.35* 1.22  CALCIUM 8.3* 8.3* 8.2*  PHOS  --  2.8  --    Liver Function Tests: Recent Labs  Lab 06/16/19 0515 06/17/19 0755  AST 46*  --   ALT 28  --   ALKPHOS 87  --   BILITOT 0.4  --   PROT 5.9*  --   ALBUMIN 1.8* 1.8*   CBC: Recent Labs  Lab 06/15/19 1200 06/16/19 0515 06/17/19 0755  WBC 16.6* 14.5* 14.0*  NEUTROABS  --  13.3*  --   HGB 10.0* 9.1* 9.2*  HCT 32.7* 30.2* 30.7*  MCV 93.4 94.7 94.5  PLT 326 288 274   CBG: Recent Labs  Lab 06/17/19 1957 06/18/19 0002 06/18/19 0452 06/18/19 0744 06/18/19 1152  GLUCAP 185* 151* 118* 168* 198*   Hgb A1c No results for input(s): HGBA1C in the last 72 hours. Lipid Profile No results for input(s): CHOL, HDL, LDLCALC, TRIG, CHOLHDL, LDLDIRECT in the last 72 hours. Thyroid function studies No results for input(s): TSH, T4TOTAL, T3FREE, THYROIDAB in the last 72 hours.  Invalid input(s): FREET3 Urinalysis    Component Value Date/Time   COLORURINE AMBER (A) 06/12/2019 1656   APPEARANCEUR CLOUDY (A) 06/12/2019 1656   LABSPEC 1.023 06/12/2019 1656   PHURINE 5.0 06/12/2019 1656   GLUCOSEU NEGATIVE 06/12/2019 1656   HGBUR LARGE (A) 06/12/2019 1656   BILIRUBINUR NEGATIVE 06/12/2019 1656   KETONESUR NEGATIVE 06/12/2019 1656   PROTEINUR 100 (A) 06/12/2019 1656   NITRITE NEGATIVE 06/12/2019 1656   LEUKOCYTESUR LARGE (A) 06/12/2019 1656    FURTHER DISCHARGE INSTRUCTIONS:   Get Medicines reviewed and adjusted: Please take all your medications with you for your next visit with your Primary MD   Laboratory/radiological  data: Please request your Primary MD to go over all hospital tests and procedure/radiological results at the follow up, please ask your Primary MD to get all Hospital records sent to his/her office.   In some cases, they will be blood work, cultures and biopsy results  pending at the time of your discharge. Please request that your primary care M.D. goes through all the records of your hospital data and follows up on these results.   Also Note the following: If you experience worsening of your admission symptoms, develop shortness of breath, life threatening emergency, suicidal or homicidal thoughts you must seek medical attention immediately by calling 911 or calling your MD immediately  if symptoms less severe.   You must read complete instructions/literature along with all the possible adverse reactions/side effects for all the Medicines you take and that have been prescribed to you. Take any new Medicines after you have completely understood and accpet all the possible adverse reactions/side effects.    Do not drive when taking Pain medications or sleeping medications (Benzodaizepines)   Do not take more than prescribed Pain, Sleep and Anxiety Medications. It is not advisable to combine anxiety,sleep and pain medications without talking with your primary care practitioner   Special Instructions: If you have smoked or chewed Tobacco  in the last 2 yrs please stop smoking, stop any regular Alcohol  and or any Recreational drug use.   Wear Seat belts while driving.   Please note: You were cared for by a hospitalist during your hospital stay. Once you are discharged, your primary care physician will handle any further medical issues. Please note that NO REFILLS for any discharge medications will be authorized once you are discharged, as it is imperative that you return to your primary care physician (or establish a relationship with a primary care physician if you do not have one) for your post  hospital discharge needs so that they can reassess your need for medications and monitor your lab values.  Time coordinating discharge: 25 minutes  SIGNED:  Marzetta Board, MD, PhD 06/22/2019, 10:44 AM

## 2019-06-22 NOTE — Care Management Important Message (Signed)
Important Message  Patient Details  Name: SIERRA WENZLICK MRN: [id number] Date of Birth: September 21, 1924   Medicare Important Message Given:  Yes - Important Message mailed due to current National Emergency   Verbal consent obtained due to current National Emergency  Relationship to patient: Spouse/Significant Other Contact Name: Aadan Zempel Call Date: 06/22/19  Time: 1311 Phone: [phone] Outcome: Spoke with contact Important Message mailed to: Patient address on file      Tommy Medal 06/22/2019, 1:12 PM

## 2019-06-22 NOTE — Discharge Instructions (Signed)
COVID-19 COVID-19 is a respiratory infection that is caused by a virus called severe acute respiratory syndrome coronavirus 2 (SARS-CoV-2). The disease is also known as coronavirus disease or novel coronavirus. In some people, the virus may not cause any symptoms. In others, it may cause a serious infection. The infection can get worse quickly and can lead to complications, such as:  Pneumonia, or infection of the lungs.  Acute respiratory distress syndrome or ARDS. This is a condition in which fluid build-up in the lungs prevents the lungs from filling with air and passing oxygen into the blood.  Acute respiratory failure. This is a condition in which there is not enough oxygen passing from the lungs to the body or when carbon dioxide is not passing from the lungs out of the body.  Sepsis or septic shock. This is a serious bodily reaction to an infection.  Blood clotting problems.  Secondary infections due to bacteria or fungus.  Organ failure. This is when your body's organs stop working. The virus that causes COVID-19 is contagious. This means that it can spread from person to person through droplets from coughs and sneezes (respiratory secretions). What are the causes? This illness is caused by a virus. You may catch the virus by:  Breathing in droplets from an infected person. Droplets can be spread by a person breathing, speaking, singing, coughing, or sneezing.  Touching something, like a table or a doorknob, that was exposed to the virus (contaminated) and then touching your mouth, nose, or eyes. What increases the risk? Risk for infection You are more likely to be infected with this virus if you:  Are within 6 feet (2 meters) of a person with COVID-19.  Provide care for or live with a person who is infected with COVID-19.  Spend time in crowded indoor spaces or live in shared housing. Risk for serious illness You are more likely to become seriously ill from the virus if you:   Are 50 years of age or older. The higher your age, the more you are at risk for serious illness.  Live in a nursing home or long-term care facility.  Have cancer.  Have a long-term (chronic) disease such as: ? Chronic lung disease, including chronic obstructive pulmonary disease or asthma. ? A long-term disease that lowers your body's ability to fight infection (immunocompromised). ? Heart disease, including heart failure, a condition in which the arteries that lead to the heart become narrow or blocked (coronary artery disease), a disease which makes the heart muscle thick, weak, or stiff (cardiomyopathy). ? Diabetes. ? Chronic kidney disease. ? Sickle cell disease, a condition in which red blood cells have an abnormal "sickle" shape. ? Liver disease.  Are obese. What are the signs or symptoms? Symptoms of this condition can range from mild to severe. Symptoms may appear any time from 2 to 14 days after being exposed to the virus. They include:  A fever or chills.  A cough.  Difficulty breathing.  Headaches, body aches, or muscle aches.  Runny or stuffy (congested) nose.  A sore throat.  New loss of taste or smell. Some people may also have stomach problems, such as nausea, vomiting, or diarrhea. Other people may not have any symptoms of COVID-19. How is this diagnosed? This condition may be diagnosed based on:  Your signs and symptoms, especially if: ? You live in an area with a COVID-19 outbreak. ? You recently traveled to or from an area where the virus is common. ? You   provide care for or live with a person who was diagnosed with COVID-19. ? You were exposed to a person who was diagnosed with COVID-19.  A physical exam.  Lab tests, which may include: ? Taking a sample of fluid from the back of your nose and throat (nasopharyngeal fluid), your nose, or your throat using a swab. ? A sample of mucus from your lungs (sputum). ? Blood tests.  Imaging tests, which  may include, X-rays, CT scan, or ultrasound. How is this treated? At present, there is no medicine to treat COVID-19. Medicines that treat other diseases are being used on a trial basis to see if they are effective against COVID-19. Your health care provider will talk with you about ways to treat your symptoms. For most people, the infection is mild and can be managed at home with rest, fluids, and over-the-counter medicines. Treatment for a serious infection usually takes places in a hospital intensive care unit (ICU). It may include one or more of the following treatments. These treatments are given until your symptoms improve.  Receiving fluids and medicines through an IV.  Supplemental oxygen. Extra oxygen is given through a tube in the nose, a face mask, or a hood.  Positioning you to lie on your stomach (prone position). This makes it easier for oxygen to get into the lungs.  Continuous positive airway pressure (CPAP) or bi-level positive airway pressure (BPAP) machine. This treatment uses mild air pressure to keep the airways open. A tube that is connected to a motor delivers oxygen to the body.  Ventilator. This treatment moves air into and out of the lungs by using a tube that is placed in your windpipe.  Tracheostomy. This is a procedure to create a hole in the neck so that a breathing tube can be inserted.  Extracorporeal membrane oxygenation (ECMO). This procedure gives the lungs a chance to recover by taking over the functions of the heart and lungs. It supplies oxygen to the body and removes carbon dioxide. Follow these instructions at home: Lifestyle  If you are sick, stay home except to get medical care. Your health care provider will tell you how long to stay home. Call your health care provider before you go for medical care.  Rest at home as told by your health care provider.  Do not use any products that contain nicotine or tobacco, such as cigarettes, e-cigarettes, and  chewing tobacco. If you need help quitting, ask your health care provider.  Return to your normal activities as told by your health care provider. Ask your health care provider what activities are safe for you. General instructions  Take over-the-counter and prescription medicines only as told by your health care provider.  Drink enough fluid to keep your urine pale yellow.  Keep all follow-up visits as told by your health care provider. This is important. How is this prevented?  There is no vaccine to help prevent COVID-19 infection. However, there are steps you can take to protect yourself and others from this virus. To protect yourself:   Do not travel to areas where COVID-19 is a risk. The areas where COVID-19 is reported change often. To identify high-risk areas and travel restrictions, check the CDC travel website: wwwnc.cdc.gov/travel/notices  If you live in, or must travel to, an area where COVID-19 is a risk, take precautions to avoid infection. ? Stay away from people who are sick. ? Wash your hands often with soap and water for 20 seconds. If soap and water   are not available, use an alcohol-based hand sanitizer. ? Avoid touching your mouth, face, eyes, or nose. ? Avoid going out in public, follow guidance from your state and local health authorities. ? If you must go out in public, wear a cloth face covering or face mask. Make sure your mask covers your nose and mouth. ? Avoid crowded indoor spaces. Stay at least 6 feet (2 meters) away from others. ? Disinfect objects and surfaces that are frequently touched every day. This may include:  Counters and tables.  Doorknobs and light switches.  Sinks and faucets.  Electronics, such as phones, remote controls, keyboards, computers, and tablets. To protect others: If you have symptoms of COVID-19, take steps to prevent the virus from spreading to others.  If you think you have a COVID-19 infection, contact your health care  provider right away. Tell your health care team that you think you may have a COVID-19 infection.  Stay home. Leave your house only to seek medical care. Do not use public transport.  Do not travel while you are sick.  Wash your hands often with soap and water for 20 seconds. If soap and water are not available, use alcohol-based hand sanitizer.  Stay away from other members of your household. Let healthy household members care for children and pets, if possible. If you have to care for children or pets, wash your hands often and wear a mask. If possible, stay in your own room, separate from others. Use a different bathroom.  Make sure that all people in your household wash their hands well and often.  Cough or sneeze into a tissue or your sleeve or elbow. Do not cough or sneeze into your hand or into the air.  Wear a cloth face covering or face mask. Make sure your mask covers your nose and mouth. Where to find more information  Centers for Disease Control and Prevention: www.cdc.gov/coronavirus/2019-ncov/index.html  World Health Organization: www.who.int/health-topics/coronavirus Contact a health care provider if:  You live in or have traveled to an area where COVID-19 is a risk and you have symptoms of the infection.  You have had contact with someone who has COVID-19 and you have symptoms of the infection. Get help right away if:  You have trouble breathing.  You have pain or pressure in your chest.  You have confusion.  You have bluish lips and fingernails.  You have difficulty waking from sleep.  You have symptoms that get worse. These symptoms may represent a serious problem that is an emergency. Do not wait to see if the symptoms will go away. Get medical help right away. Call your local emergency services (911 in the [place] Do not drive yourself to the hospital. Let the emergency medical personnel know if you think you have COVID-19. Summary  COVID-19 is a  respiratory infection that is caused by a virus. It is also known as coronavirus disease or novel coronavirus. It can cause serious infections, such as pneumonia, acute respiratory distress syndrome, acute respiratory failure, or sepsis.  The virus that causes COVID-19 is contagious. This means that it can spread from person to person through droplets from breathing, speaking, singing, coughing, or sneezing.  You are more likely to develop a serious illness if you are 50 years of age or older, have a weak immune system, live in a nursing home, or have chronic disease.  There is no medicine to treat COVID-19. Your health care provider will talk with you about ways to treat your symptoms.    Take steps to protect yourself and others from infection. Wash your hands often and disinfect objects and surfaces that are frequently touched every day. Stay away from people who are sick and wear a mask if you are sick. This information is not intended to replace advice given to you by your health care provider. Make sure you discuss any questions you have with your health care provider. Document Revised: 03/17/2019 Document Reviewed: 06/23/2018 Elsevier Patient Education  2020 [employer]  

## 2019-06-22 NOTE — Progress Notes (Addendum)
Transport here to pick up pt to transport to hospice facility. Pt stable and appears comfortabl at time of D/C. Report given. No questions. AVS, DNR form, and prescriptions given to transport. Report called to Helene Kelp at [facility]. She asked that we leave midline in place and remove PIV. She didn't have any other questions after report. Called family to advise pt was on his way to [facility]

## 2019-06-22 NOTE — TOC Transition Note (Addendum)
Transition of Care [facility]) - CM/SW Discharge Note   Patient Details  Name: Omar Gomez MRN: [id number] Date of Birth: [birth year]  [facility] CM/SW Contact:  Atilano Median, LCSW Phone Number: 06/22/2019, 11:55 AM   Clinical Narrative:    Discharged to [facility]. Referral coordinated with Freddie Breech. Patient's wife [name] is aware and agreeable to this plan. Number to call report [phone] given to unit RN Aja. No other needs at this time. Case closed to this CSW.    Discharge summary faxed to [phone]  Final next level of care: Hospice Medical Facility Barriers to Discharge: Barriers Resolved   Patient Goals and CMS Choice   CMS Medicare.gov Compare Post Acute Care list provided to:: Patient Represenative (must comment) Choice offered to / list presented to : Adult Children  Discharge Placement                Patient to be transferred to facility by: [place] Name of family member notified: Rosemarie Patient and family notified of of transfer: 06/22/19  Discharge Plan and Services                                     Social Determinants of Health (SDOH) Interventions     Readmission Risk Interventions No flowsheet data found.

## 2019-06-23 NOTE — Consult Note (Signed)
   [facility] CM Inpatient Consult   06/23/2019  DEZJUAN COPUS 1924-07-24 [id number]    Update Note:  Chart review reveals transition of care SW notes stats that patient transitions to [place] ([place]) Family was interested in [facility].  No [facility] follow up needed, patient will have full care under Hospice.  For questions and additional information, please contact:  Dannae Kato A. Ameirah Khatoon, BSN, RN-BC [facility] Liaison Cell: [phone]

## 2019-07-03 DEATH — deceased

## 2020-09-03 IMAGING — CT CT HIP*L* W/O CM
2 of 3 series · 17 of 46 positions shown, 19 images · non-contrast
Comparison: X-ray 03/22/2019, CT 02/15/2017

CLINICAL DATA: Left hip pain.  Multiple falls

EXAM:
CT OF THE LEFT HIP WITHOUT CONTRAST
TECHNIQUE: Multidetector CT imaging of the left hip was performed according to
the standard protocol. Multiplanar CT image reconstructions were
also generated.

[Series 7: coronal st · coronal · 0.45mm/px · 3 of 84 slices shown]
[im 28/84  soft-tissue]
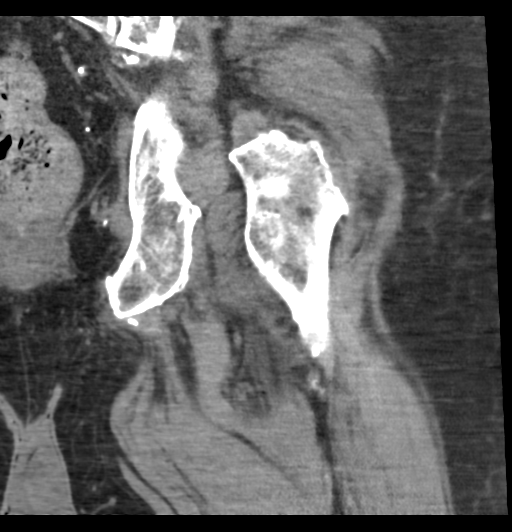
[im 37/84  soft-tissue]
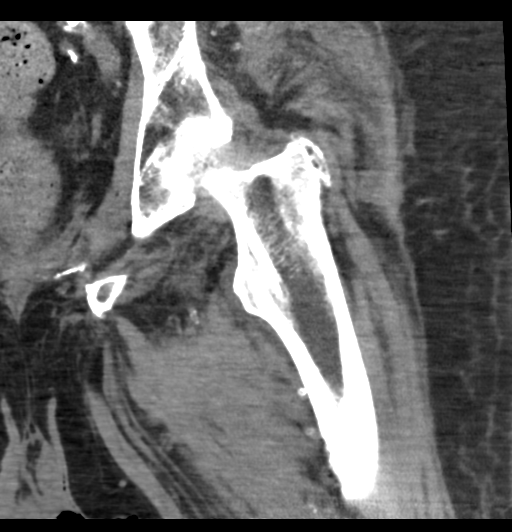
[im 47/84  soft-tissue]
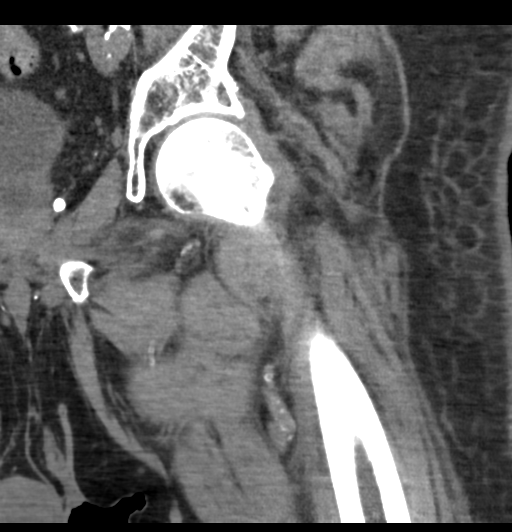

[Series 12: axial st · axial · 0.45mm/px · z∈[-621,-427]mm · 14 of 113 slices shown, 16 images]
[im 8/113  soft-tissue]
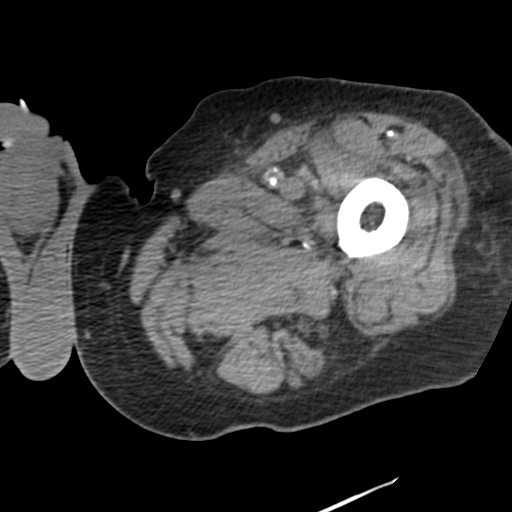
[im 8/113  bone]
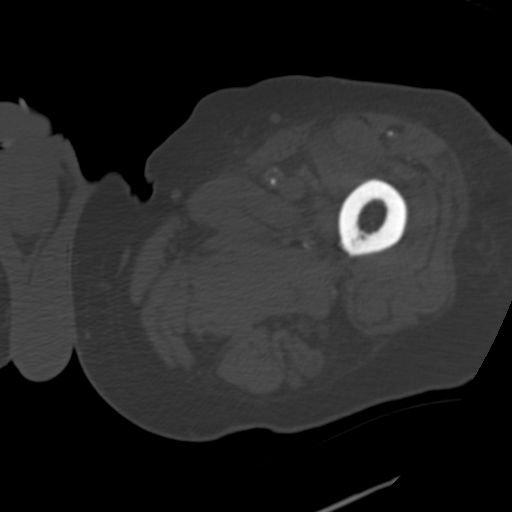
[im 15/113  soft-tissue]
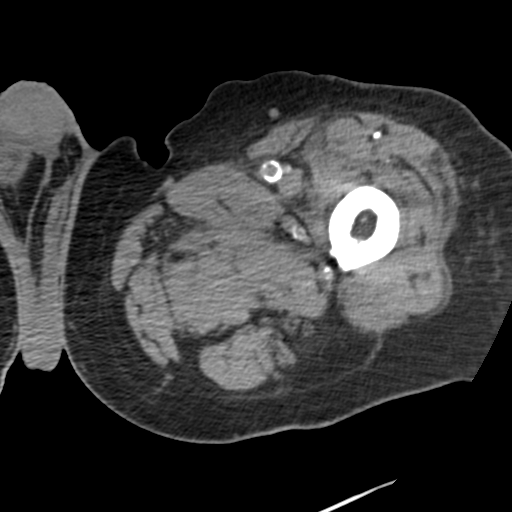
[im 22/113  soft-tissue]
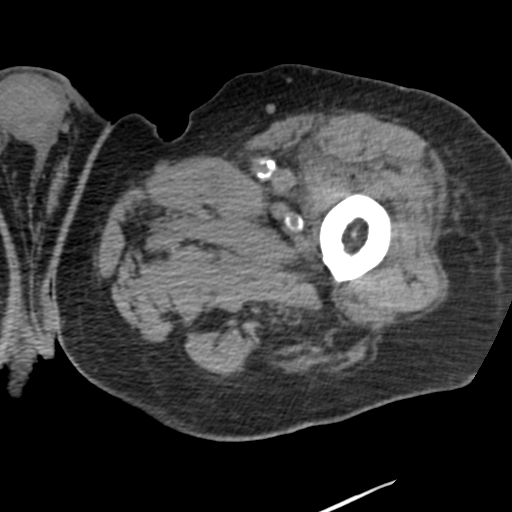
[im 29/113  soft-tissue]
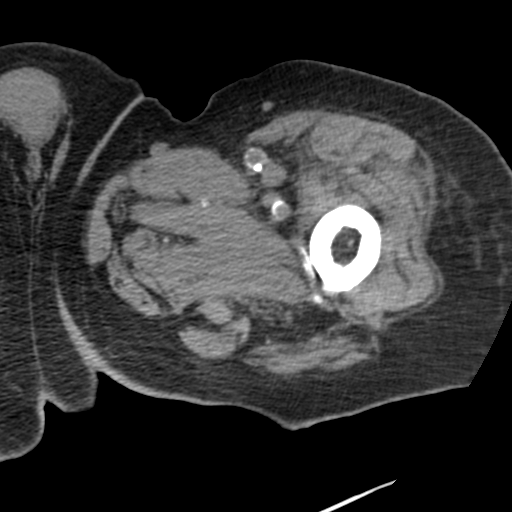
[im 37/113  soft-tissue]
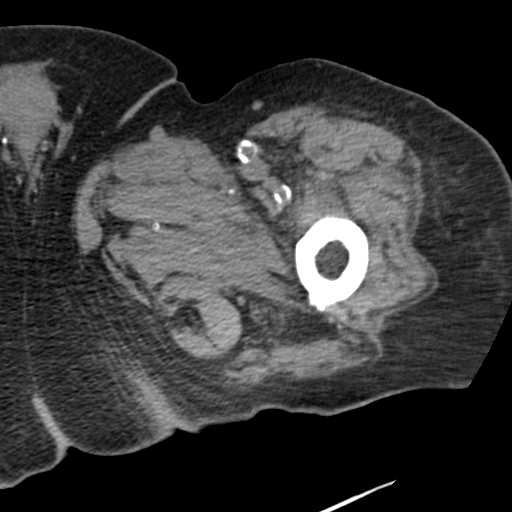
[im 44/113  soft-tissue]
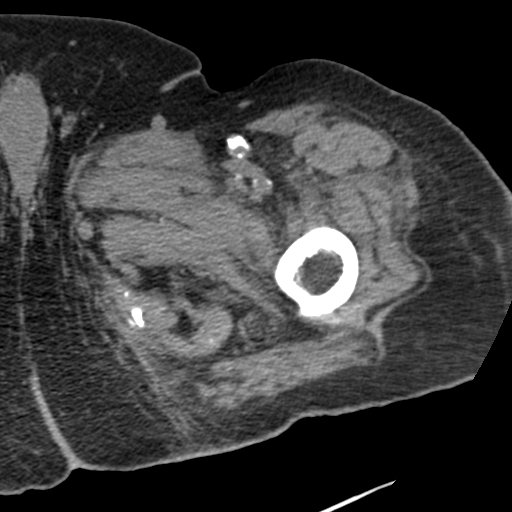
[im 51/113  soft-tissue]
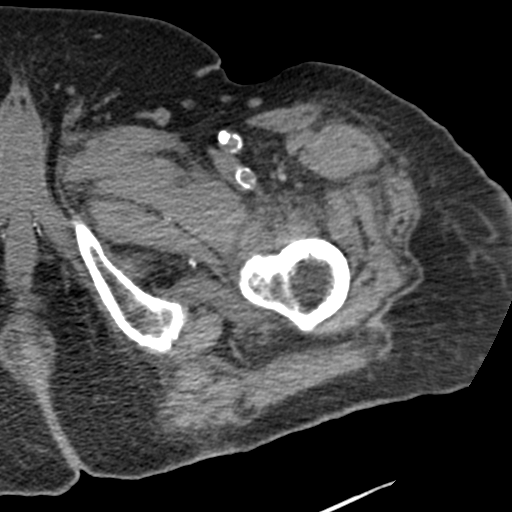
[im 62/113  soft-tissue]
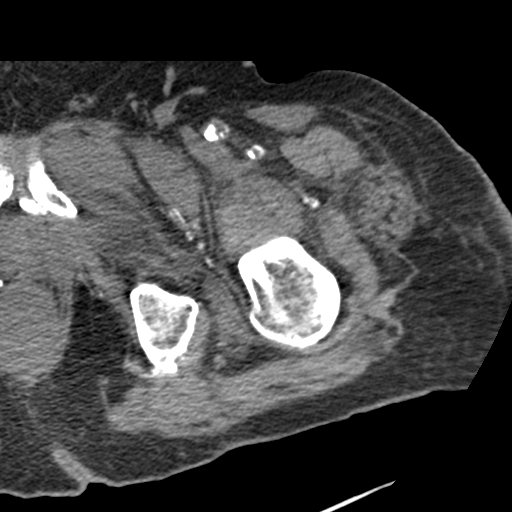
[im 69/113  soft-tissue]
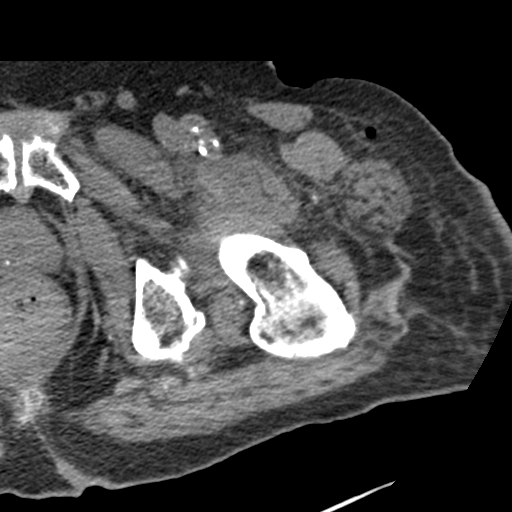
[im 69/113  bone]
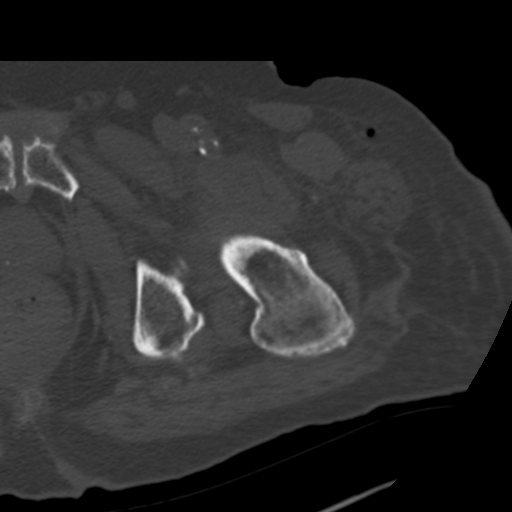
[im 76/113  soft-tissue]
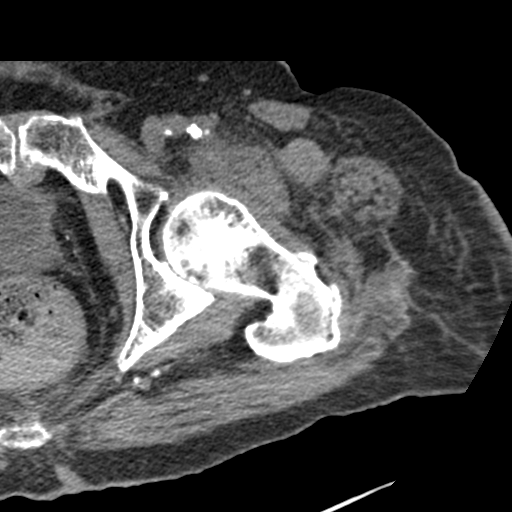
[im 84/113  soft-tissue]
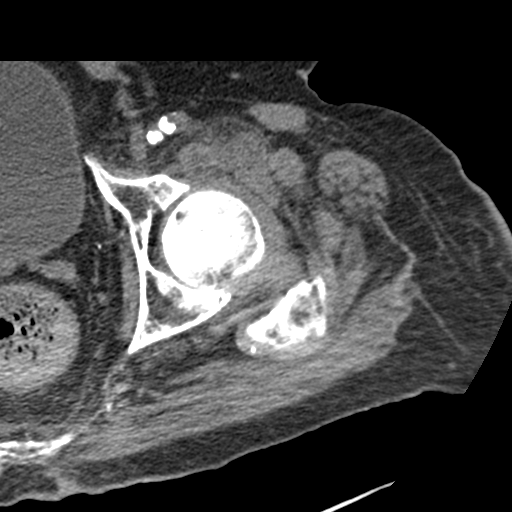
[im 91/113  soft-tissue]
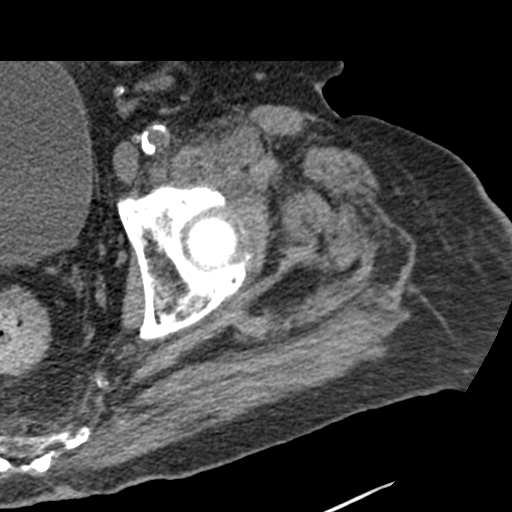
[im 98/113  soft-tissue]
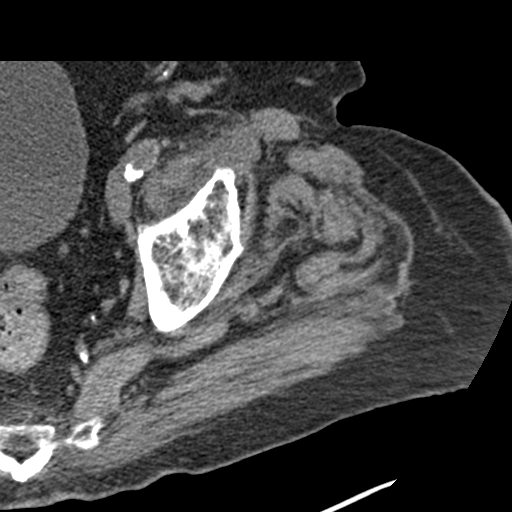
[im 105/113  soft-tissue]
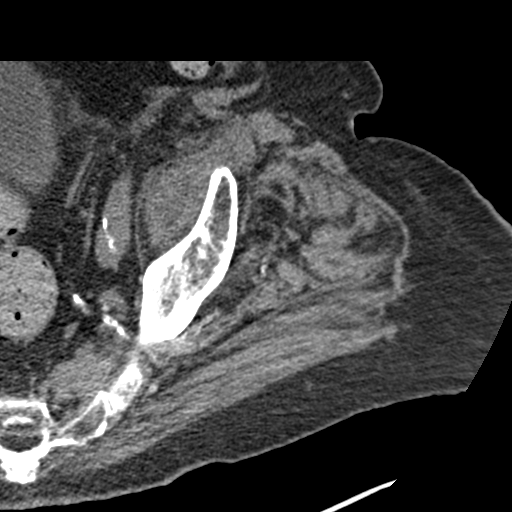

[17 of 46 positions shown; findings below may reference images not displayed]

FINDINGS: Bones/Joint/Cartilage

No acute fracture is identified. No dislocation. Mild left hip joint
space narrowing. Prominent marginal acetabular osteophyte formation.
Trochanteric enthesopathic changes. Degenerative changes at the
pubic symphysis. No bone lesion identified. No visible hip joint
effusion.

Ligaments

Suboptimally assessed by CT.

Muscles and Tendons

Mild diffuse muscular atrophy. No intramuscular hematoma. Tendinous
structures about the left hip grossly intact within the limitations
of this exam.

Soft tissues

No soft tissue hematoma.  Extensive vascular calcifications.
IMPRESSION: No acute osseous abnormality, left hip.
